# Patient Record
Sex: Female | Born: 1980 | Race: Black or African American | Hispanic: No | Marital: Married | Smoking: Current some day smoker
Health system: Southern US, Community
[De-identification: ages and names within clinical notes are randomized; demographics above are authoritative.]

## PROBLEM LIST (undated history)

## (undated) DIAGNOSIS — Z3009 Encounter for other general counseling and advice on contraception: Secondary | ICD-10-CM

## (undated) DIAGNOSIS — N61 Mastitis without abscess: Principal | ICD-10-CM

## (undated) DIAGNOSIS — O139 Gestational [pregnancy-induced] hypertension without significant proteinuria, unspecified trimester: Secondary | ICD-10-CM

## (undated) DIAGNOSIS — E079 Disorder of thyroid, unspecified: Secondary | ICD-10-CM

## (undated) DIAGNOSIS — G4730 Sleep apnea, unspecified: Secondary | ICD-10-CM

## (undated) DIAGNOSIS — K219 Gastro-esophageal reflux disease without esophagitis: Secondary | ICD-10-CM

## (undated) DIAGNOSIS — Z3490 Encounter for supervision of normal pregnancy, unspecified, unspecified trimester: Principal | ICD-10-CM

## (undated) DIAGNOSIS — B009 Herpesviral infection, unspecified: Secondary | ICD-10-CM

## (undated) DIAGNOSIS — I10 Essential (primary) hypertension: Secondary | ICD-10-CM

## (undated) DIAGNOSIS — F17200 Nicotine dependence, unspecified, uncomplicated: Secondary | ICD-10-CM

## (undated) DIAGNOSIS — R599 Enlarged lymph nodes, unspecified: Secondary | ICD-10-CM

## (undated) DIAGNOSIS — Z30017 Encounter for initial prescription of implantable subdermal contraceptive: Secondary | ICD-10-CM

## (undated) DIAGNOSIS — O99345 Other mental disorders complicating the puerperium: Secondary | ICD-10-CM

## (undated) DIAGNOSIS — R112 Nausea with vomiting, unspecified: Secondary | ICD-10-CM

## (undated) DIAGNOSIS — F53 Postpartum depression: Secondary | ICD-10-CM

## (undated) DIAGNOSIS — E559 Vitamin D deficiency, unspecified: Principal | ICD-10-CM

## (undated) HISTORY — DX: Encounter for supervision of normal pregnancy, unspecified, unspecified trimester: Z34.90

## (undated) HISTORY — DX: Encounter for initial prescription of implantable subdermal contraceptive: Z30.017

## (undated) HISTORY — DX: Disorder of thyroid, unspecified: E07.9

## (undated) HISTORY — DX: Postpartum depression: F53.0

## (undated) HISTORY — DX: Gestational (pregnancy-induced) hypertension without significant proteinuria, unspecified trimester: O13.9

## (undated) HISTORY — DX: Encounter for other general counseling and advice on contraception: Z30.09

## (undated) HISTORY — DX: Other mental disorders complicating the puerperium: O99.345

## (undated) HISTORY — DX: Nausea with vomiting, unspecified: R11.2

## (undated) HISTORY — DX: Enlarged lymph nodes, unspecified: R59.9

## (undated) HISTORY — DX: Vitamin D deficiency, unspecified: E55.9

## (undated) HISTORY — PX: DILATION AND CURETTAGE OF UTERUS: SHX78

## (undated) HISTORY — DX: Mastitis without abscess: N61.0

---

## 2002-08-10 ENCOUNTER — Emergency Department (HOSPITAL_COMMUNITY): Admission: EM | Admit: 2002-08-10 | Discharge: 2002-08-11 | Payer: Self-pay | Admitting: *Deleted

## 2004-08-22 ENCOUNTER — Emergency Department (HOSPITAL_COMMUNITY): Admission: EM | Admit: 2004-08-22 | Discharge: 2004-08-22 | Payer: Self-pay | Admitting: Emergency Medicine

## 2006-11-15 ENCOUNTER — Emergency Department (HOSPITAL_COMMUNITY): Admission: EM | Admit: 2006-11-15 | Discharge: 2006-11-16 | Payer: Self-pay | Admitting: Emergency Medicine

## 2007-02-25 ENCOUNTER — Ambulatory Visit (HOSPITAL_COMMUNITY): Admission: AD | Admit: 2007-02-25 | Discharge: 2007-02-25 | Payer: Self-pay | Admitting: Obstetrics and Gynecology

## 2007-04-25 ENCOUNTER — Ambulatory Visit: Payer: Self-pay | Admitting: *Deleted

## 2007-04-25 ENCOUNTER — Inpatient Hospital Stay (HOSPITAL_COMMUNITY): Admission: AD | Admit: 2007-04-25 | Discharge: 2007-04-25 | Payer: Self-pay | Admitting: Obstetrics and Gynecology

## 2007-05-01 ENCOUNTER — Inpatient Hospital Stay (HOSPITAL_COMMUNITY): Admission: AD | Admit: 2007-05-01 | Discharge: 2007-05-04 | Payer: Self-pay | Admitting: Obstetrics and Gynecology

## 2007-05-06 ENCOUNTER — Inpatient Hospital Stay (HOSPITAL_COMMUNITY): Admission: AD | Admit: 2007-05-06 | Discharge: 2007-05-09 | Payer: Self-pay | Admitting: Obstetrics & Gynecology

## 2007-05-07 ENCOUNTER — Encounter: Payer: Self-pay | Admitting: Obstetrics & Gynecology

## 2007-05-07 ENCOUNTER — Ambulatory Visit: Payer: Self-pay | Admitting: Obstetrics & Gynecology

## 2008-01-20 ENCOUNTER — Emergency Department (HOSPITAL_COMMUNITY): Admission: EM | Admit: 2008-01-20 | Discharge: 2008-01-20 | Payer: Self-pay | Admitting: Emergency Medicine

## 2008-01-21 ENCOUNTER — Encounter (HOSPITAL_COMMUNITY): Admission: RE | Admit: 2008-01-21 | Discharge: 2008-02-20 | Payer: Self-pay | Admitting: Emergency Medicine

## 2008-01-21 ENCOUNTER — Emergency Department (HOSPITAL_COMMUNITY): Admission: EM | Admit: 2008-01-21 | Discharge: 2008-01-21 | Payer: Self-pay | Admitting: Emergency Medicine

## 2008-01-23 ENCOUNTER — Ambulatory Visit: Payer: Self-pay | Admitting: Gastroenterology

## 2008-01-23 ENCOUNTER — Encounter: Payer: Self-pay | Admitting: Gastroenterology

## 2008-01-23 ENCOUNTER — Observation Stay (HOSPITAL_COMMUNITY): Admission: EM | Admit: 2008-01-23 | Discharge: 2008-01-24 | Payer: Self-pay | Admitting: Emergency Medicine

## 2008-01-24 ENCOUNTER — Ambulatory Visit: Payer: Self-pay | Admitting: Gastroenterology

## 2008-01-25 ENCOUNTER — Emergency Department (HOSPITAL_COMMUNITY): Admission: EM | Admit: 2008-01-25 | Discharge: 2008-01-25 | Payer: Self-pay | Admitting: Emergency Medicine

## 2008-02-26 ENCOUNTER — Ambulatory Visit: Payer: Self-pay | Admitting: Gastroenterology

## 2008-02-26 ENCOUNTER — Inpatient Hospital Stay (HOSPITAL_COMMUNITY): Admission: AD | Admit: 2008-02-26 | Discharge: 2008-03-01 | Payer: Self-pay | Admitting: Gastroenterology

## 2008-02-26 ENCOUNTER — Emergency Department (HOSPITAL_COMMUNITY): Admission: EM | Admit: 2008-02-26 | Discharge: 2008-02-26 | Payer: Self-pay | Admitting: Emergency Medicine

## 2008-02-27 ENCOUNTER — Ambulatory Visit: Payer: Self-pay | Admitting: Internal Medicine

## 2008-02-28 ENCOUNTER — Encounter (INDEPENDENT_AMBULATORY_CARE_PROVIDER_SITE_OTHER): Payer: Self-pay | Admitting: General Surgery

## 2008-03-01 ENCOUNTER — Ambulatory Visit: Payer: Self-pay | Admitting: Internal Medicine

## 2008-03-01 ENCOUNTER — Inpatient Hospital Stay (HOSPITAL_COMMUNITY): Admission: EM | Admit: 2008-03-01 | Discharge: 2008-03-04 | Payer: Self-pay | Admitting: Emergency Medicine

## 2008-03-02 ENCOUNTER — Ambulatory Visit: Payer: Self-pay | Admitting: Gastroenterology

## 2008-03-04 ENCOUNTER — Ambulatory Visit: Payer: Self-pay | Admitting: Gastroenterology

## 2008-03-06 ENCOUNTER — Ambulatory Visit: Payer: Self-pay | Admitting: Internal Medicine

## 2008-03-06 ENCOUNTER — Inpatient Hospital Stay (HOSPITAL_COMMUNITY): Admission: EM | Admit: 2008-03-06 | Discharge: 2008-03-10 | Payer: Self-pay | Admitting: Emergency Medicine

## 2008-03-09 ENCOUNTER — Ambulatory Visit: Payer: Self-pay | Admitting: Gastroenterology

## 2008-03-10 ENCOUNTER — Ambulatory Visit: Payer: Self-pay | Admitting: Gastroenterology

## 2008-03-12 ENCOUNTER — Emergency Department (HOSPITAL_COMMUNITY): Admission: EM | Admit: 2008-03-12 | Discharge: 2008-03-12 | Payer: Self-pay | Admitting: Emergency Medicine

## 2009-06-29 ENCOUNTER — Other Ambulatory Visit: Admission: RE | Admit: 2009-06-29 | Discharge: 2009-06-29 | Payer: Self-pay | Admitting: Obstetrics & Gynecology

## 2010-01-15 IMAGING — US US ABDOMEN COMPLETE
1 series · 14 of 25 positions shown · non-contrast
Comparison: None.

CLINICAL DATA: Abdominal pain, nausea, vomiting

ABDOMEN ULTRASOUND
TECHNIQUE: Complete abdominal ultrasound examination was performed
including evaluation of the liver, gallbladder, bile ducts,
pancreas, kidneys, spleen, IVC, and abdominal aorta.

[Series 1: unknown · 0.35mm/px · 14 of 52 slices shown]
[im 1/52]
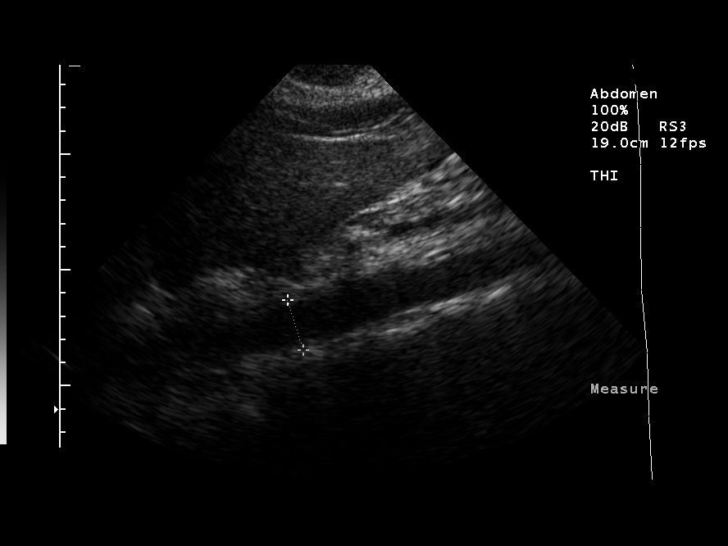
[im 5/52]
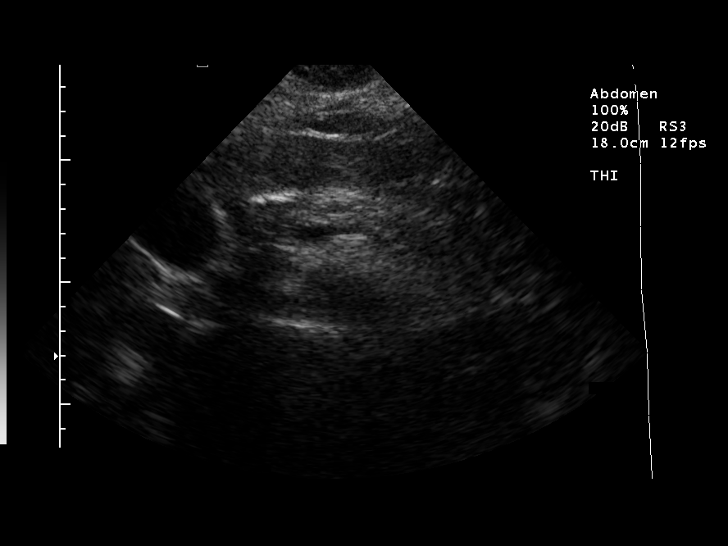
[im 9/52]
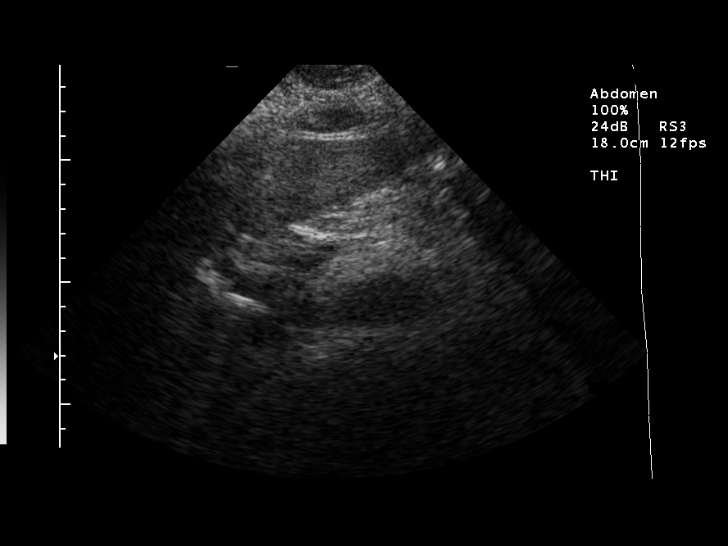
[im 13/52]
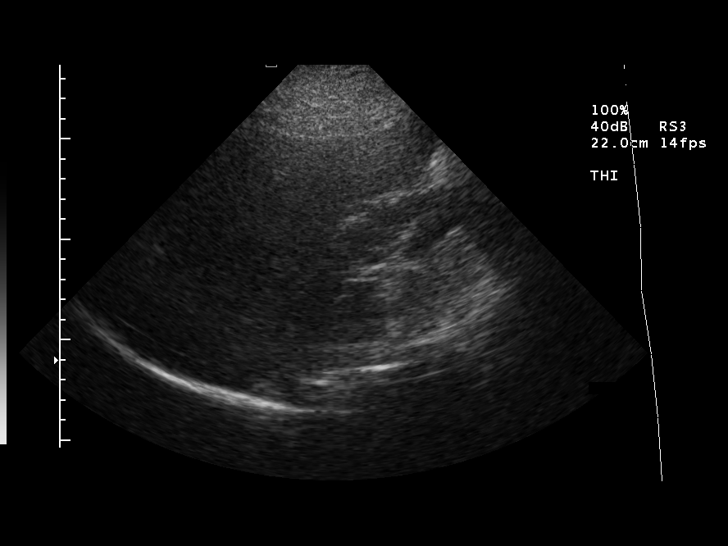
[im 18/52]
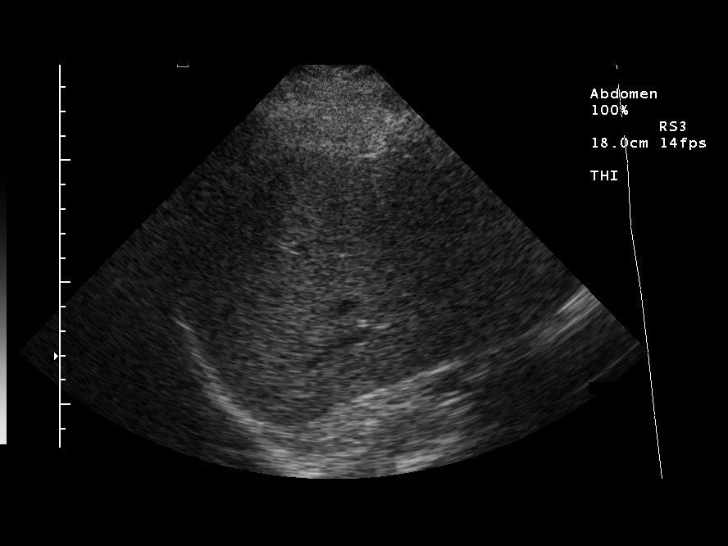
[im 20/52]
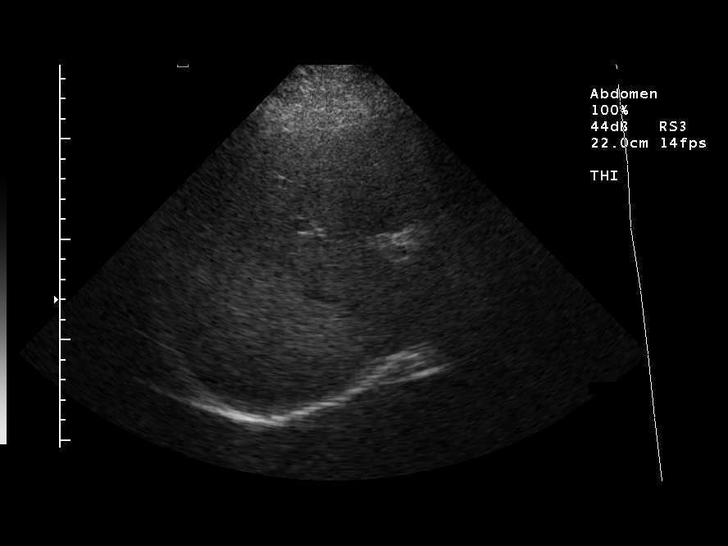
[im 24/52]
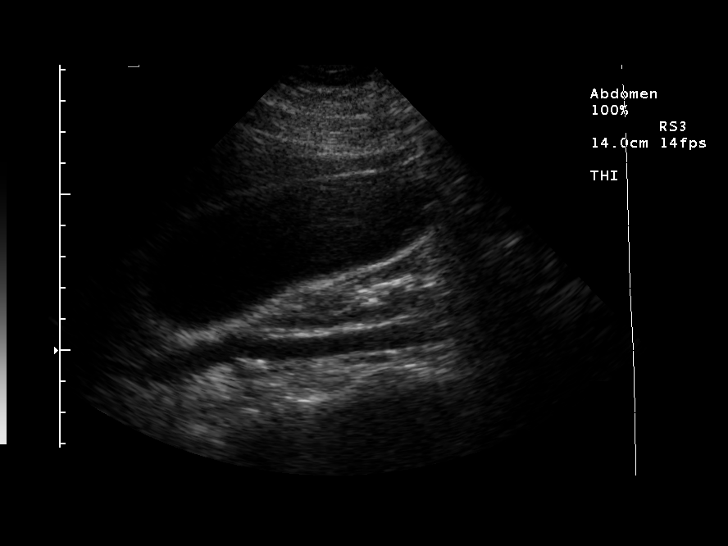
[im 28/52]
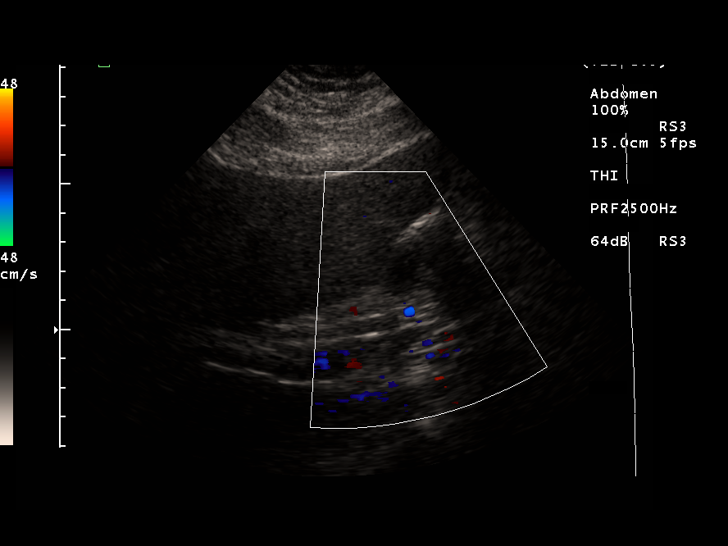
[im 32/52]
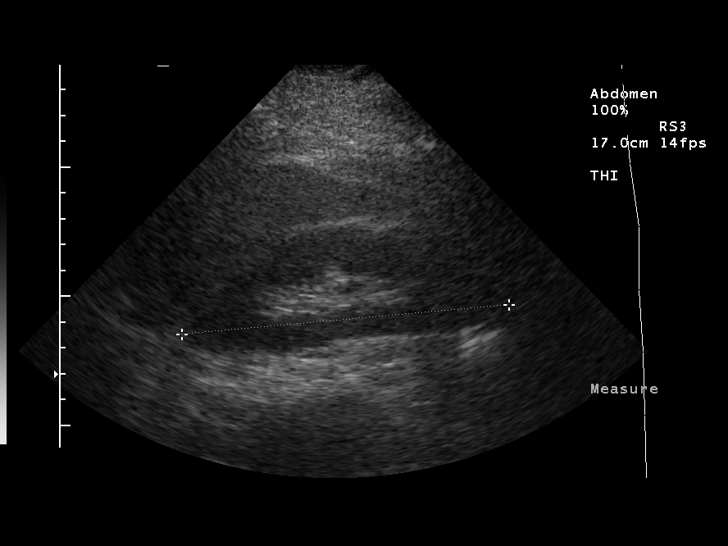
[im 35/52]
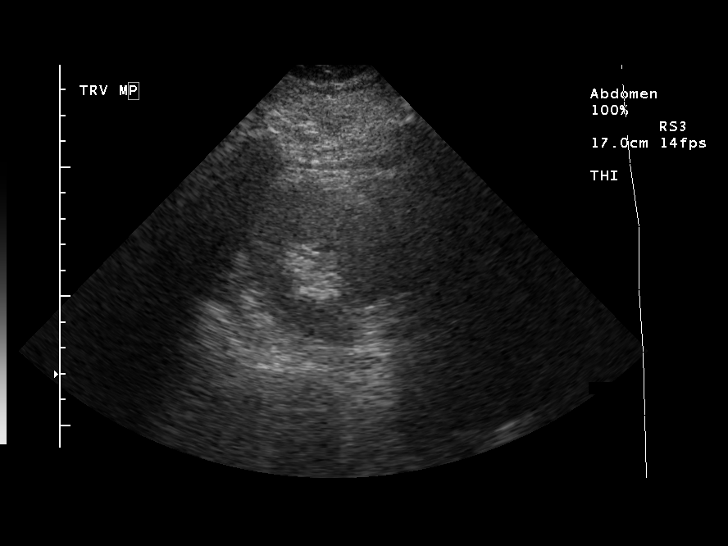
[im 39/52]
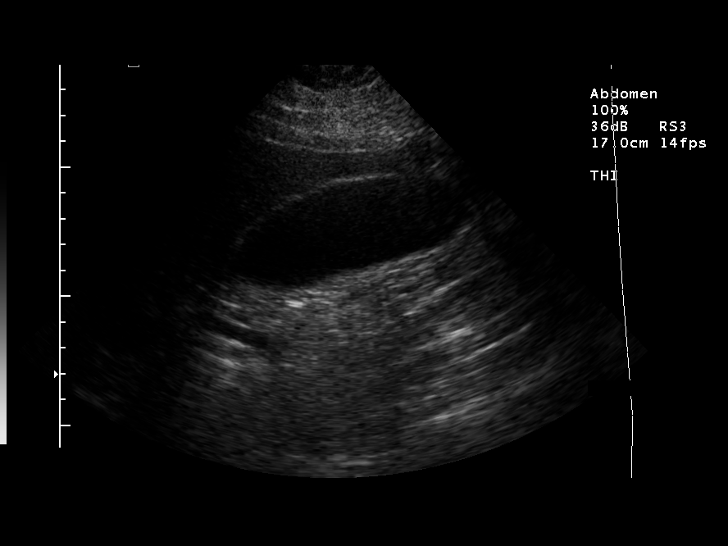
[im 43/52]
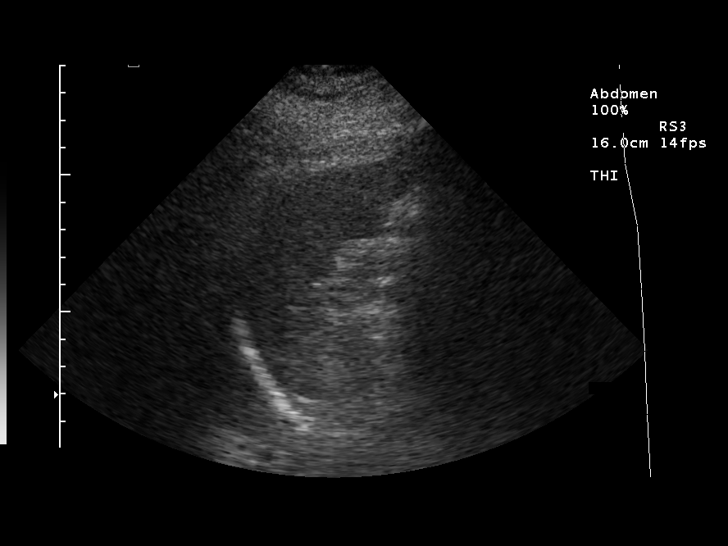
[im 47/52]
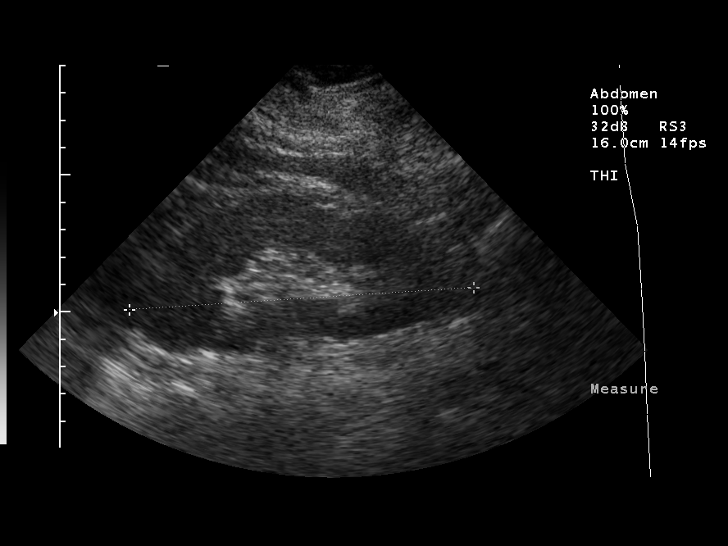
[im 52/52]
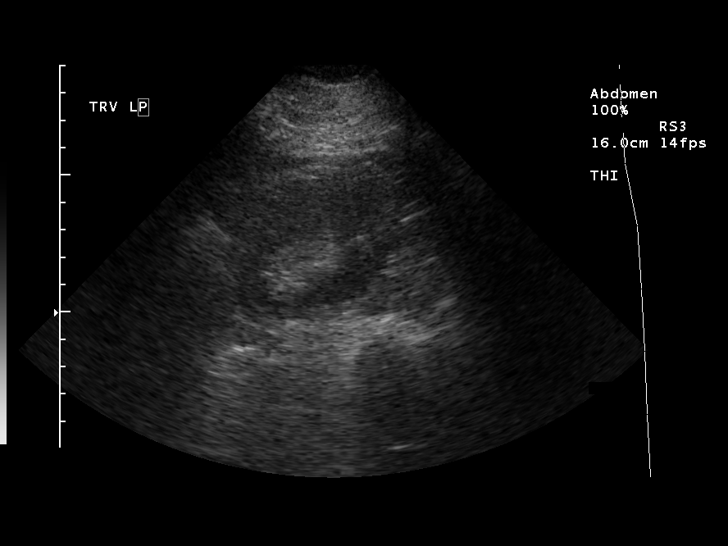

[14 of 25 positions shown; findings below may reference images not displayed]

FINDINGS: The gallbladder is normal without stone disease, wall
thickening or pericholecystic fluid.  Gallbladder wall thickness is
2.6 mm.  The common bile duct is normal with a diameter of 4.5 mm.
The liver demonstrates coarse increased echogenicity which can be
seen with mild fatty infiltration.  The imaging of the IVC,
pancreas, spleen, kidneys, and aorta are within normal limits.  The
spleen has a length of 9.3 cm.  The right kidney measures 12.7 cm
and the left kidney measures 12.6 cm.  No hydronephrosis or
obstruction.  No perinephric fluid collection.  Aorta has a maximal
diameter 2.3 cm.  No aneurysm or free fluid.

Exam is limited because of patient body habitus.
IMPRESSION: No acute finding by abdominal ultrasound.

## 2010-01-19 IMAGING — CR DG ABDOMEN ACUTE W/ 1V CHEST
4 series · 4 of 4 positions shown · non-contrast
Comparison: 05/07/2007

CLINICAL DATA: Nausea, vomiting, back, abdominal pain.  Status post
EGD with biopsy on 01/23/2008

ACUTE ABDOMEN SERIES (ABDOMEN 2 VIEW & CHEST 1 VIEW)

[view not recorded (1 of 4)]
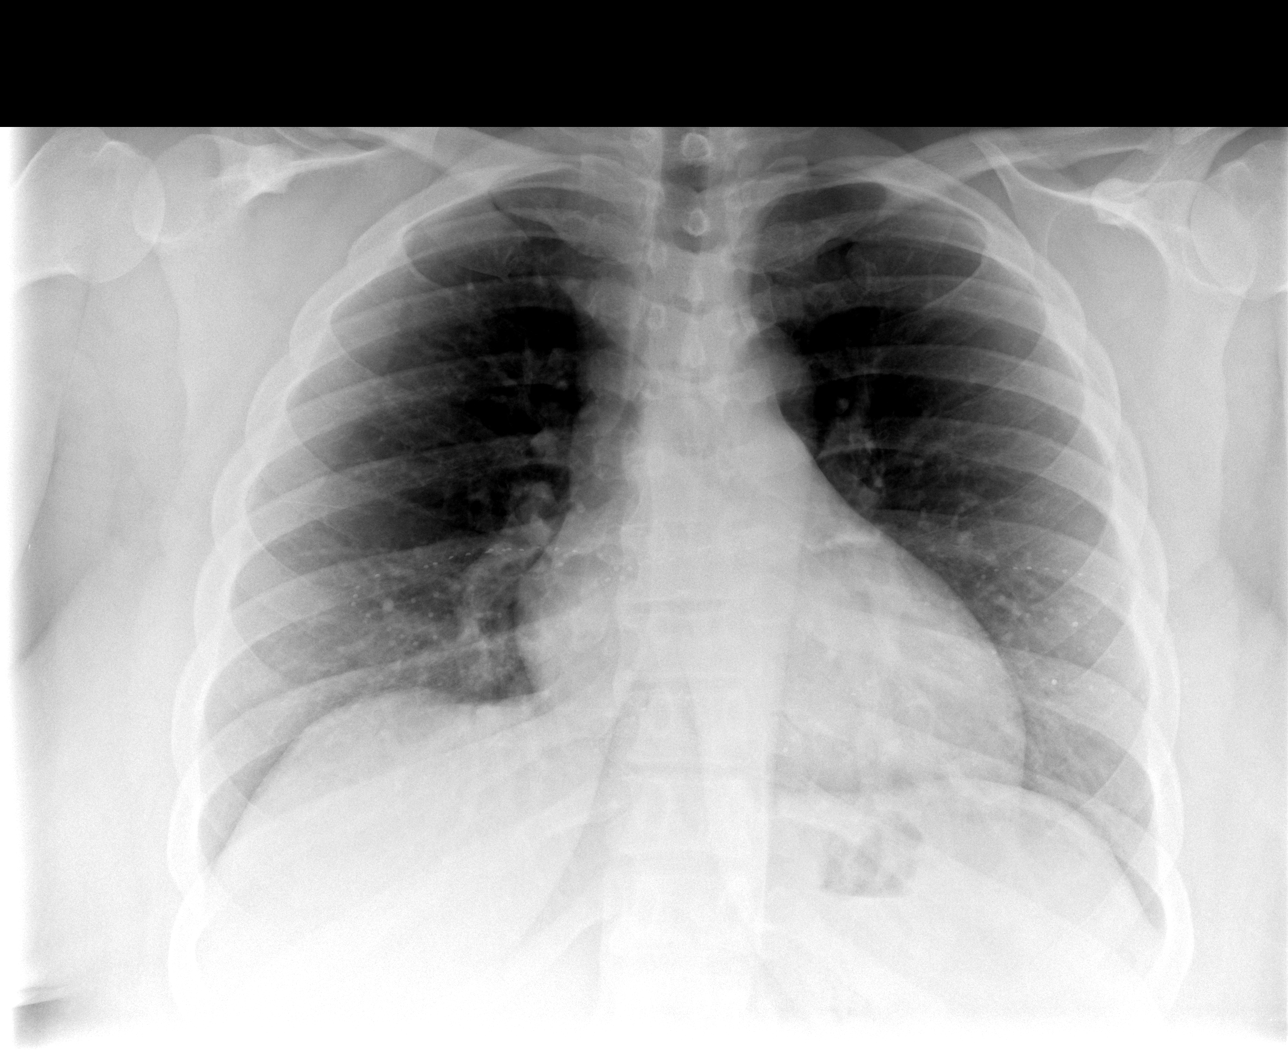

[view not recorded (2 of 4)]
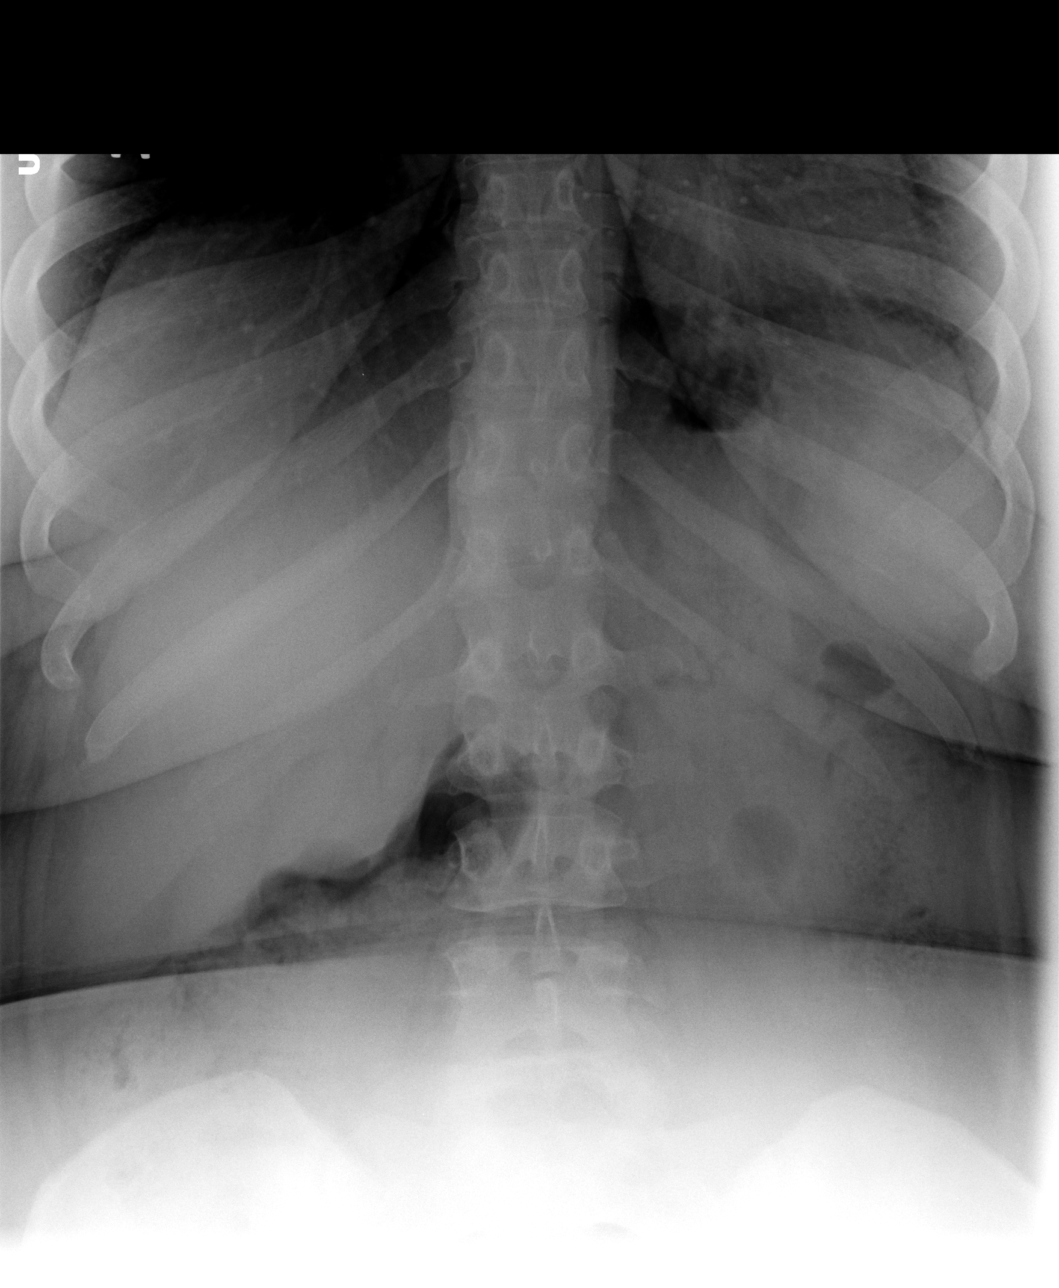

[view not recorded (3 of 4)]
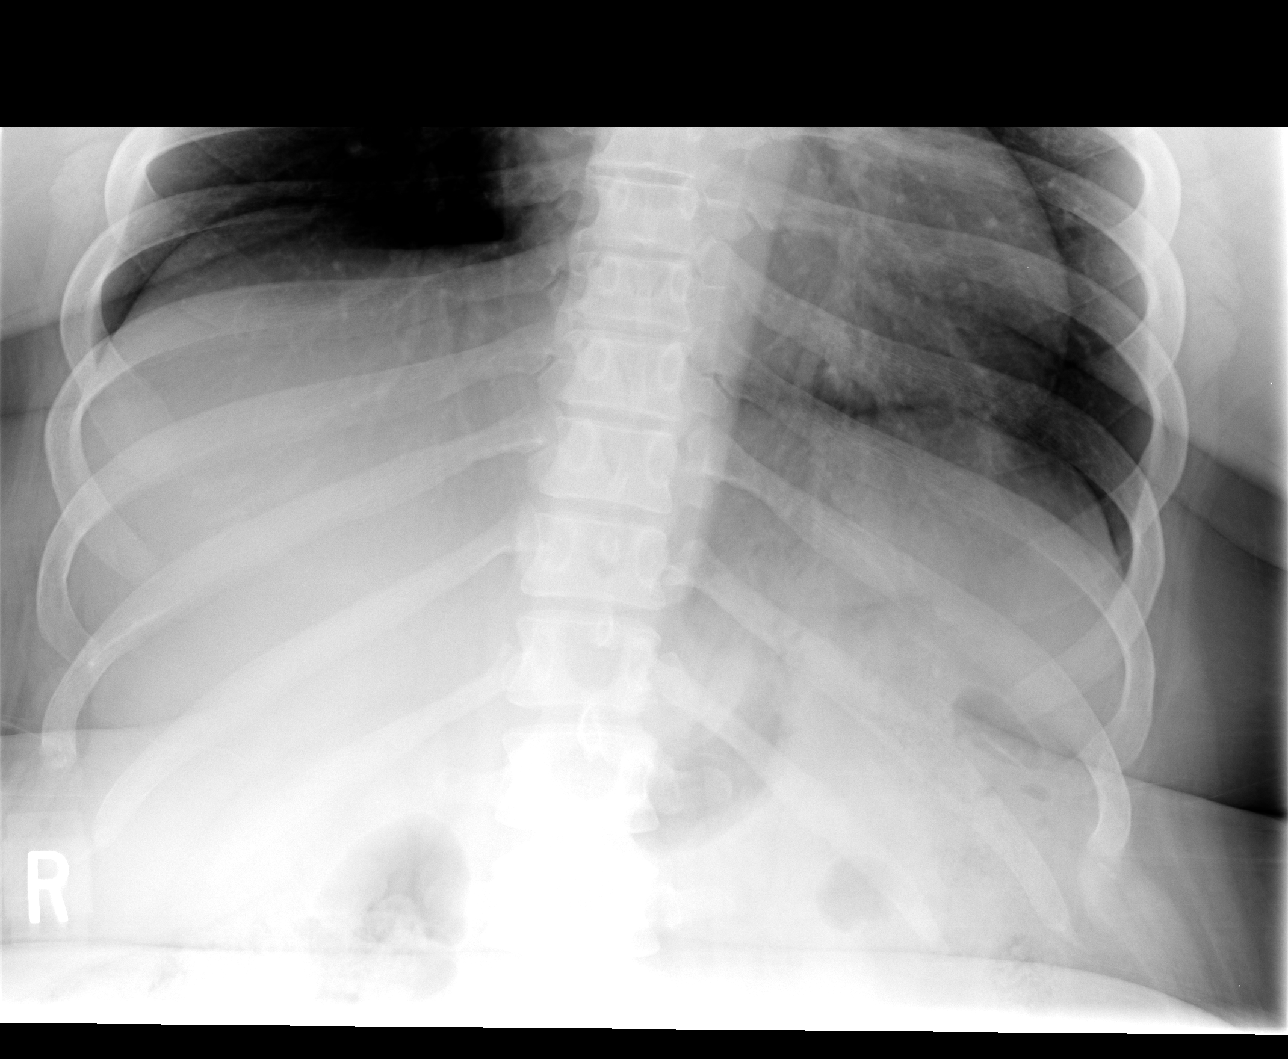

[view not recorded (4 of 4)]
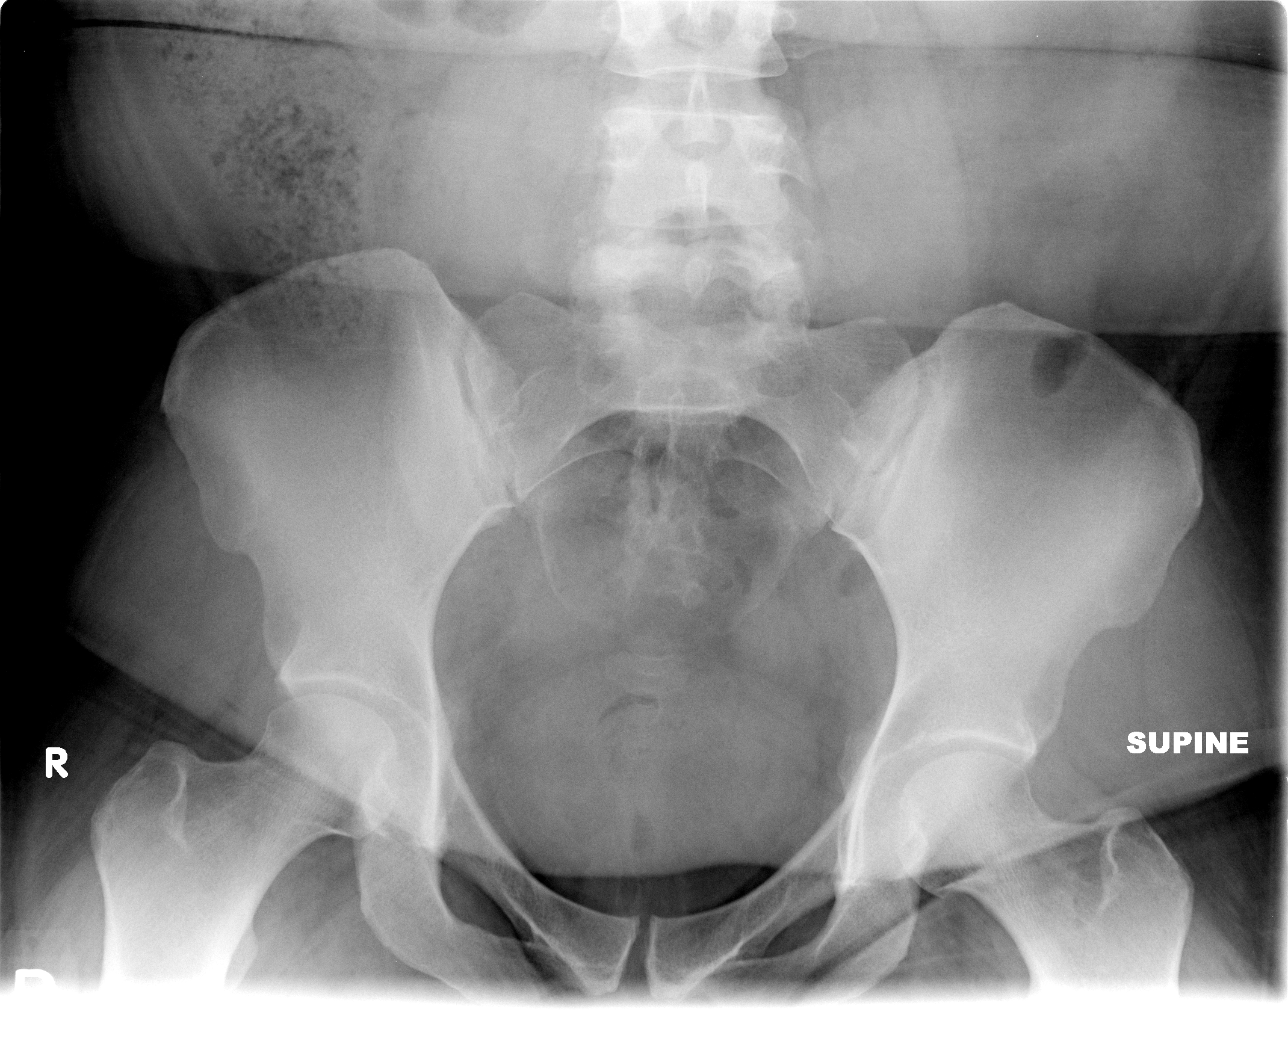

[4 of 4 positions shown; findings below may reference images not displayed]

FINDINGS: Heart size is upper limits normal.  Lungs are free of
focal consolidations and pleural effusions. Radiopaque material
overlies the mid chest, presumably related to the patient's
clothing.

Supine and erect views of the abdomen show a nonobstructive bowel
gas pattern.  There is no free intraperitoneal air beneath the
diaphragm.  Overall, there is a paucity of bowel gas.
IMPRESSION: No evidence for acute cardiopulmonary disease or bowel obstruction.

## 2010-07-28 ENCOUNTER — Other Ambulatory Visit: Admission: RE | Admit: 2010-07-28 | Discharge: 2010-07-28 | Payer: Self-pay | Admitting: Obstetrics & Gynecology

## 2010-09-11 NOTE — L&D Delivery Note (Signed)
Delivery Note At 7:46 AM a viable female was delivered via  (Presentation: OP).  APGAR: 7 @ 5 min, 9 @ 10 minutes; weight 8 lb 6.9 oz (3824 g).   Placenta status: delivered spontaneously , intact .  Cord: 3 vessels with the following complications: Nuchal cord x 1. Cord pH: 7.24  Complications: There was fetal bradycardia for approximately 2 minutes prior to delivery, which was prolonged by an OP presentation. The baby had poor tone, respiratory effort and color upon delivery, so she was immediately transferred to the warmer. A Code Apgar was called. Dr. Katrinka Blazing from the NICU responded and cared for the baby accordingly. The patient has a post-partum hemorrhage immediately after delivery of the placenta. This was treated with intrauterine pressure and fundal massage. No placenta was retained after a sweep of the uterus. 1000 mcg of Cytotec was placed per rectum. The hemorrhage slowed significantly after the delivery of Cytotec. Of note, the patient had been on magnesium for approximately 6 hours for pre-eclampsia.   Anesthesia: Epidural Lacerations: 2nd degree, closed  Suture Repair: 3.0 vicryl by Dr.  Karilyn Cota. Blood Loss (mL): 1200 mL   Mom to AICU for magnesium treatment for pre-eclampsia.  Baby to nursery-stable.  Mat Carne M.D.  07/08/2011, 9:11 AM

## 2011-01-24 NOTE — H&P (Signed)
[name], [name]              ACCOUNT NO.:  [id number]   MEDICAL RECORD NO.:  [id number]          PATIENT TYPE:  INP   LOCATION:  A315                          FACILITY:  [facility]   PHYSICIAN:  Kassie Mends, M.D.      DATE OF BIRTH:  08/11/1981   DATE OF ADMISSION:  02/26/2008  DATE OF DISCHARGE:  LH                              HISTORY & PHYSICAL   CHIEF COMPLAINT:  Acute abdominal pain, nausea, and vomiting.   HISTORY OF PRESENT ILLNESS:  [name] is a 30 year old obese African  American female.  [name] was initially evaluated by Dr. Kassie Mends on Jan 23, 2008, when [name] was hospitalized at [facility].  [name] began  to have right upper quadrant abdominal pain which was quite severe along  with nausea and vomiting which required hospitalization.  [name] had an  ultrasound and HIDA scan which showed high-grade nonvisualization of the  gallbladder in 2 hours.  [name] was unable to have morphine injection as  [name] did not have a right home.  [name] had an EGD which showed a 6 mm x 3  mm ulcer in the gastric body.  [name] has been on b.i.d. Protonix since  that time.  [name] was doing very well until work last night.  [name] tells me  [name] developed severe abdominal pain about 1 a.m. along with nausea and  vomiting multiple times.  [name] describes the pain as constant to [name]  right upper quadrant epigastrium and it does radiate from [name] mid back  forward and [name] right flank.  [name] rates the pain 8/10 on pain scale.  [name] last ate at [facility] about 11:30 last night.  [name] denies any  fever.  [name] has gone 2-3 days without a bowel movement.  [name] denies any  clay-colored stools, rectal bleeding, or melena.  [name] denies any  anorexia or early satiety.  [name] has not had a chance to get [name] pain  pills filled and felt like [name] did not need them until this acute  episode of pain.  [name] was given a prescription for hydrocodone from Dr.  Cira Servant the last time [name] was in the hospital.  [name] did present to the ER  early  this morning.  Laboratory studies showed a white blood cell count  of 11.2, a hemoglobin 11.6, a hematocrit of 35.3, and a platelet count  of 301.  [name] had a lipase of 25.  [name] had a complete metabolic panel  which showed a glucose of 125 and normal LFTs.   PAST MEDICAL AND SURGICAL HISTORY:  1. Peptic ulcer disease found on EGD, Jan 23, 2008, has been on b.i.d.      Protonix.  2. History of ectopic pregnancy, D&C.  3. [name] did receive a blood transfusion in February 2009.  [name] has      history of chronic anemia.   MEDICATIONS PRIOR TO ADMISSION:  Protonix 40 mg b.i.d.   ALLERGIES:  No known drug allergies.   FAMILY HISTORY:  There is no known family history of colorectal  carcinoma, liver, or chronic GI problems.  SOCIAL HISTORY:  [name] is married.  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  [name] denies any tobacco, alcohol, or drug use.   REVIEW OF SYSTEMS:  See HPI.  GU:  [name] denies any dysuria, hematuria, or  increased urinary frequency.  Otherwise, negative review of systems.   PHYSICAL EXAMINATION:  VITAL SIGNS:  Weight 296 pounds, height 65  inches, temperature 98.9, blood pressure 128/86, and pulse 76.  GENERAL:  [name] is a morbidly obese African American female who is  alert, oriented, pleasant, cooperative, and diaphoretic.  [name] complains  of severe pain throughout the exam.  HEENT:  Sclerae clear, nonicteric. Conjunctivae pink. Oropharynx pink  and moist without any lesions.  NECK:  Supple without mass or thyromegaly.  CHEST:  Heart regular rate and rhythm, normal S1 and S2 without murmurs,  clicks, rubs, or gallops.  LUNGS:  Clear to auscultation bilaterally.  ABDOMEN:  Positive bowel sounds x4.  No bruits auscultated.  Soft.  [name]  does have moderate tenderness to the epigastrium, right upper quadrant,  and umbilicus on deep palpation.  There is no rebound tenderness or  guarding.  [name] does have positive Murphy sign.  There is no  hepatosplenomegaly or mass, although  exam is limited given the patient's  body habitus.  EXTREMITIES:  Without clubbing or edema bilaterally.   IMPRESSION:  [name] is a 30 year old African American female with  history of peptic ulcer disease, chronic anemia, acute onset of nausea  and vomiting, and right upper quadrant epigastric abdominal pain which  radiates to [name] back.  Symptoms are suspicious for biliary etiology and  gallbladder was not visualized after 2 hours on previous tests.  [name]  could have biliary dyskinesia as the culprit of [name] pain.  [name] LFTs are  normal, however.  Other possibilities include acute gastroenteritis,  renal lithiasis, ectopic pregnancy, appendicitis, and less likely  diverticulitis.  I have discussed this case further with Dr. Cira Servant.  We  also need to rule out acute intermittent porphyria.   PLAN:  1. We will admit to [facility] under Dr. Cira Servant' service.  2. MRCP today.  3. Stat UA and urine pregnancy.  4. A 24-hour urine for PBGs and ALA.  5. Urine drug screen.  6. IV fluids normal saline 100 mL/h n.p.o.  7. Zofran 4 mg q.6 hours for nausea and vomiting.  8. Dilaudid 1 mg q.4 h. p.r.n. severe abdominal pain.  9. CBC, LFTs, MET-7 in the a.m.  10.Protonix 40 mg IV b.i.d.  11.Pending MRCP findings, we would consider CT scanning of entire      abdomen and pelvis to rule out above if symptoms persist and [name]      does not turn around.      Lorenza Burton, N.P.      Kassie Mends, M.D.  Electronically Signed    KJ/MEDQ  D:  02/26/2008  T:  02/27/2008  Job:  [id number]

## 2011-01-24 NOTE — Discharge Summary (Signed)
NAME:  JIMMA, ORTMAN              ACCOUNT NO.:  [id number]   MEDICAL RECORD NO.:  [id number]          PATIENT TYPE:  INP   LOCATION:  A315                          FACILITY:  [facility]   PHYSICIAN:  R. Roetta Sessions, M.D. DATE OF BIRTH:  07-26-1981   DATE OF ADMISSION:  02/26/2008  DATE OF DISCHARGE:  06/21/2009LH                               DISCHARGE SUMMARY   PRIMARY GASTROENTEROLOGIST:  Dr. Anselmo Rod.   PRIMARY CARE PHYSICIAN:  The patient does not have a primary care  physician.   DISCHARGE DIAGNOSES:  1. Acute cholecystitis with gallbladder, hydrops status post      laparoscopic cholecystectomy by Dr. Leticia Penna.  2. Urinary tract infection.  3. Peptic ulcer disease.   SERVICE:  Dr. Kassie Mends.   SURGEON:  Dr. Tilford Pillar.   PROCEDURES:  On February 28, 2008, she underwent a laparoscopic  cholecystectomy by Dr. Leticia Penna for gallbladder, hydrops, cholelithiasis  and acute cholecystitis.  For history, see complete history and physical  from February 26, 2008.   HOSPITAL COURSE:  Basically, ms. Cleckler was admitted on February 26, 2008,  with acute intermittent epigastric and right upper quadrant abdominal  pain, nausea and vomiting.  She previously had an ultrasound and HIDA  scan which showed high-grade nonvisualization of the gallbladder in 2  hours.  She was unable to obtain morphine injection due to the fact that  she did not have a ride home.  She would be admitted to [facility], underwent MRCP.  She was found to have gallstones identified  within the dependent portion of the gallbladder with a gallbladder wall  thickness 6 mm, possible stone in the cystic duct.  Surgical  consultation was obtained by Dr. Leticia Penna and he did proceed with  laparoscopic cholecystectomy as above.  On postop day #1, she was  tachycardic and had a temperature of 101.6.  Urinalysis was obtained  which did show UTI.  She was started on p.o. Cipro at that time.  She is  doing well this morning,  is eating a regular diet, afebrile and without  complaints of pain or nausea this morning.  She has been taking the p.o.  Percocet as needed for pain.   PHYSICAL EXAMINATION:  VITAL SIGNS:  Today, temperature 98.3, pulse 93,  respirations 20, blood pressure 126/80, O2 93% on room air.  She is  alert, oriented, pleasant and cooperative.  No acute distress.  HEENT:  Sclerae are clear, nonicteric.  Conjunctivae pink.  Oropharynx  pink, moist without lesions.  NECK:  Supple without thyromegaly.  CHEST:  Regular rate and rhythm.  Normal S1-S2 no murmurs, clicks, rubs.  Abdomen: Obese with well healing surgical scars with Steri-Strips  intact.  No exudates.  Abdomen has positive bowel sounds x4.  No bruits  auscultated.  Soft, nontender without palpable mass or  hepatosplenomegaly.  EXTREMITIES:  Without clubbing, edema and negative Homans' sign.  SKIN:  Warm and dry without rash or jaundice.   LABORATORY STUDIES:  From today, white blood cell count 13.3, hemoglobin  2.6, hematocrit 32.5 and platelets 279.   DISCHARGE CONDITION:  Good.   DISPOSITION:  She is discharged to home.   MEDICATIONS:  1. Protonix 40 mg p.o. b.i.d.  2. Cipro 500 mg p.o. b.i.d. for 3 days more.  3. Multivitamin once daily.  4. Percocet 5/325 mg p.o. every 4 hours as needed for pain.  5. Colace 100 mg stool softener b.i.d. p.r.n. for constipation.   INSTRUCTIONS:  She is to advance activity as tolerated.  Resume a  regular low fat, low cholesterol diet and follow up with Dr. Leticia Penna as  planned.  She is going to call the office on Monday and follow up with  Dr. Kassie Mends within the next 2 months and call sooner if she has any  problems.      Lorenza Burton, N.P.      Jonathon Bellows, M.D.  Electronically Signed    KJ/MEDQ  D:  03/01/2008  T:  03/01/2008  Job:  [id number]   cc:   Tilford Pillar, MD  Fax: [phone]

## 2011-01-24 NOTE — H&P (Signed)
NAMEDACODA, SPALLONE              ACCOUNT NO.:  [id number]   MEDICAL RECORD NO.:  [id number]          PATIENT TYPE:  INP   LOCATION:  A312                          FACILITY:  [facility]   PHYSICIAN:  R. Roetta Sessions, M.D. DATE OF BIRTH:  04-18-1981   DATE OF ADMISSION:  03/01/2008  DATE OF DISCHARGE:  LH                              HISTORY & PHYSICAL   PRIMARY GASTROENTEROLOGIST:  Kassie Mends, M.D.   CHIEF COMPLAINT:  Nausea, vomiting, fever, chills, status post  cholecystectomy.   HISTORY OF PRESENT ILLNESS:  Ms. Macaluso is a 30 year old African-American  female.  She was discharged home from the hospital today status post  laparoscopic cholecystectomy by Dr. Tilford Pillar with history of UTI  and peptic ulcer disease.  She was discharged home in stable condition.  She was afebrile without nausea, vomiting or abdominal pain.  She went  home and within a couple of hours she developed diaphoresis, nausea,  vomiting and a throbbing right upper quadrant and mid abdominal pain at  her incision sites.  The pain is made worse with movement.  She states  the pain is similar to what she had prior to the cholecystectomy but not  quite as severe.  She rates the pain as a 7/10 on the scale.  She has  not had a bowel movement yet since surgery.  Her temperature at home was  around 100.9.  She notices decreased urinary frequency.  Denies any  dysuria at this point.  She last ate salad and Jello prior to discharge  around 1300 today.  She has not taken any medications since she has been  home.  Please see complete dictated discharge summary for complete  hospitalization details.  She did have a mild bump in her AST and ALT of  45 and 70 respectively.  Her albumin is 2.8.  She had a white blood cell  count of 14.7.  Her hemoglobin is stable at 11.1.   PAST MEDICAL AND SURGICAL HISTORY:  1. Peptic ulcer disease.  2. Cholelithiasis.  3. Gallbladder hydrops status post laparoscopic cholecystectomy by  Dr.      Leticia Penna.  4. UTI.   MEDICATIONS PRIOR TO ADMISSION:  1. Protonix 40 mg b.i.d.  2. Cipro 500 mg b.i.d.  3. Multivitamin daily.  4. Percocet 5/325 mg q.4h. p.r.n.  5. Colace 100 mg b.i.d. p.r.n. constipation.   ALLERGIES:  NO KNOWN DRUG ALLERGIES.   FAMILY HISTORY AND SOCIAL HISTORY:  Noncontributory.   REVIEW OF SYSTEMS:  She denies any headache, dizziness.  CARDIOPULMONARY:  She denies any chest pain, cough or hemoptysis.  GI:  See HPI.   PHYSICAL EXAMINATION:  VITAL SIGNS:  Temperature 100.5 pulse 106,  respirations 22, blood pressure 147/93.  GENERAL:  Ms. Duarte is a 30 year old obese African-American female who  is alert, oriented, pleasant, cooperative.  She is diaphoretic.  HEENT:  Sclerae are clear, nonicteric.  Conjunctiva are pink.  Oropharynx is pink and moist.  NECK:  Supple without mass or thyromegaly.  CHEST:  Heart rate with mild tachycardia.  LUNGS:  Clear to auscultation bilaterally.  ABDOMEN:  She has well-healing cholecystectomy scars with Steri-Strips  intact and no exudate.  She has positive bowel sounds x4.  No bruits  auscultated.  Abdomen is slightly distended.  She does have moderate  right upper quadrant and mid abdominal tenderness around the umbilicus.  There is no rebound tenderness or guarding.  Abdomen is not rigid.  EXTREMITIES:  Without clubbing, edema or Homans' sign.   LABORATORY STUDIES:  Hemoglobin 11.1, hematocrit 34.1, platelets 323.  White blood cell count of 14.7.  Sodium 137, potassium 3.9, chloride  101, CO2 of 33, BUN 5, creatinine 0.87, glucose 117 and calcium 8.9.  Total bilirubin 0.9, direct bilirubin 0.3, indirect 0.6 and alkaline  phosphatase 117.   IMPRESSION:  Ms. Roseman is a 30 year old African-American female 2 days  postop laparoscopic cholecystectomy for gallbladder hydrops,  cholelithiasis, acute cholecystitis.  She was discharged home from the  hospital today in stable condition.  She returned with nausea,  vomiting,  fever and right upper quadrant mid abdominal pain.  She has had a mild  bump in her transaminases.  Would be concerned about bile leak and  perforation, and she also has a urinary tract infection and has been on  Cipro for 24 hours now.  She does have a history of peptic ulcer  disease, as well.   I have discussed this case with Dr. Jena Gauss, as well as Dr. Tilford Pillar.   PLAN:  1. She will be admitted to [facility].  2. IV fluids, pain control, antiemetics.  3. Chest x-ray now.  4. Stat CT of the abdomen and pelvis with IV and oral contrast.  5. CBC, LFTs, MET7 in the morning.  6. I would suspect air-fluid levels on CT scan due to the fact that      she is postop; however, if bile leak is not identified, would      pursue a HIDA scan in the morning to rule out bile leak  7. Unasyn 1.5 grams q.6h.  8. Ms. Rizor will be placed on Dr. Illene Regulus consult list, as he would      like to see her in the morning.     Lorenza Burton, N.P.      Jonathon Bellows, M.D.  Electronically Signed   KJ/MEDQ  D:  03/01/2008  T:  03/01/2008  Job:  [id number]

## 2011-01-24 NOTE — H&P (Signed)
NAMEJAZZMON, PRINDLE              ACCOUNT NO.:  [id number]   MEDICAL RECORD NO.:  [id number]          PATIENT TYPE:  OBV   LOCATION:  A336                          FACILITY:  [facility]   PHYSICIAN:  Skeet Latch, DO    DATE OF BIRTH:  [birth year]   DATE OF ADMISSION:  01/23/2008  DATE OF DISCHARGE:  LH                              HISTORY & PHYSICAL   PRIMARY CARE PHYSICIAN:  [facility].   CHIEF COMPLAINTS:  Abdominal pain with vomiting.   HISTORY OF PRESENT ILLNESS:  This is a 30 year old African American  female who presents with an approximately 10-day history of pain that  starts her right scapula and radiates to her right upper quadrant.  The  patient states that she has also been having some vomiting.  She states  the pain lasts 10-15 minutes and can last for 3 hours.  The patient  states that it can be precipitated by food, especially late at night.  The patient has been using some ibuprofen that she has been given in the  emergency room.  The patient states that she does not drink lots of  alcohol, but does on occasion at social events.  The patient denies any  heartburn, chest pain, or genitourinary complaints.  The patient denies  any __________.   GASTROINTESTINAL:  Positive for some abdominal pain and nausea and  vomiting.  CARDIOVASCULAR:  No chest pain or palpitations.  GENITOURINARY:  No dysuria, urgency, or frequency.  RESPIRATORY:  No  cough, shortness of breath, or wheezing.  Other systems are negative.   PHYSICAL EXAMINATION:  VITAL SIGNS:  Temperature 98.4, pulse 79,  respirations 20, blood pressure 107/87, she is saturating at 97%.  CARDIOVASCULAR:  Regular rate and rhythm.  No murmurs, rubs, or gallops.  LUNGS:  Clear to auscultation bilaterally.  No rales, rhonchi, or  wheezing.  ABDOMEN:  Obese, soft.  Minimal tenderness right upper quadrant on deep  palpation.  No rigidity or guarding.  She has positive bowel sounds.  EXTREMITIES:   No clubbing, cyanosis, or edema.  NEUROLOGIC:  Cranial nerves II-XII are grossly intact.  The patient is  alert and oriented x3.   LABORATORIES:  Hemoglobin 12.1, hematocrit 36.4, white count 9.5,  platelets 320.  Sodium 139, potassium 3.6, chloride 104, CO2 26, BUN 9,  creatinine 0.8, glucose 104, AST 19, ALT 20, alkaline phosphatase 99,  total bilirubin 0.3, lipase 23, albumin 3.6, calcium 9.  Urinalysis  showed positive protein, no leukocytes or nitrites.  Urine pregnancy  test done on Jan 20, 2008 was negative.   She did have a HIDA scan.  Gallbladder was not visualized over 2 hours.  Also, on Jan 21, 2008, she had an ultrasound that showed no acute  findings by abdominal ultrasound.   ASSESSMENT AND PLAN:  1. Abdominal pain with nausea and vomiting.  The patient will be      admitted to the service of [facility].  2. Gastroenterology has been consulted and has already seen the      patient, and the patient did have an EGD which I believe  found some      ulcers.  The patient has been placed on a clear liquid diet.  She      is ordered pain medication.  A gastrin level has been ordered, and      she will not receive any aspirin or NSAIDs for next 30 days.  The      patient also was place on a PPI q.12 h.  3. The patient be IV hydrated, and she will be watched fairly closely      at this time.  I anticipate the patient being discharged fairly      soon if she continues to improve.      Skeet Latch, DO  Electronically Signed     SM/MEDQ  D:  01/23/2008  T:  01/23/2008  Job:  [id number]

## 2011-01-24 NOTE — Consult Note (Signed)
[name], [name]              ACCOUNT NO.:  [id number]   MEDICAL RECORD NO.:  [id number]          PATIENT TYPE:  INP   LOCATION:  A315                          FACILITY:  [facility]   PHYSICIAN:  Tilford Pillar, MD      DATE OF BIRTH:  June 17, 1981   DATE OF CONSULTATION:  02/27/2008  DATE OF DISCHARGE:                                 CONSULTATION   REASON FOR CONSULTATION:  Abdominal pain.   HISTORY OF PRESENT ILLNESS:  The patient is a 30 year old African  American female with a history of peptic ulcer disease and chronic  anemia and morbid obesity who presents with abdominal pain, nausea and  vomiting.  This had happened initially a month ago at which time she was  evaluated,  had an endoscopy done which demonstrated a peptic ulcer  disease and gastritis.  She was treated on Protonix and initially had  resolution of symptoms.  However, since that time, she has developed a  new onset of right upper quadrant abdominal pain.  This pain starts in  the mid back and radiates around the right side to the epigastrium.  She  describes it as sharp and constant and 8/10 on the pain scale.  She has  had diarrhea which has been nonbloody.  No hematochezia and no melena.  She has had nonbloody emesis and nauseas typically associated after the  pain and she has had no other change with bowel movements.  She has had  no family history of gallbladder disease.  She has had two pregnancies  in the past.  She has no Native American ancestry.  She has had no fever  or chills.   PAST MEDICAL HISTORY:  1. Peptic ulcer disease.  2. Ectopic pregnancy.  3. Chronic anemia.   PAST SURGICAL HISTORY:  D&C.   MEDICATIONS:  1. Protonix.  2. Ultram.  3. Zofran.   ALLERGIES:  No known drug allergies.   SOCIAL HISTORY:  No tobacco.  No alcohol.  No recreational drug use.   PHYSICAL EXAM:  VITAL SIGNS:  97.8, 78, 20, 134/87, 100% oxygen  saturation on room air.  GENERAL:  She is morbidly obese.  She is  lying in a supine position in  the hospital bed.  She does appear somewhat uncomfortable.  She does not  appear to be in any acute distress.  She is alert and oriented x3.  HEENT:  Pupils are equal, round and reactive.  Extraocular movements are  intact.  No scleral icterus is noted.  NECK:  Trachea is midline.  PULMONARY:  Unlabored respirations.  She is clear to auscultation  bilaterally.  CARDIOVASCULAR:  Regular rate and rhythm.  She has 2+ bounding radial  pulses bilaterally.  ABDOMEN:  Abdomen is soft, obese.  She does have right upper quadrant  pain.  She does not have any rebound.  She has no diffuse peritoneal  signs.  She does have a Murphy sign which is elicited with deep  inspirations.  No masses, no hernias are appreciated.  EXTREMITIES:  Warm and dry.   PERTINENT RADIOGRAPHIC AND LABORATORY RESULTS:  CBC;  white blood cell  count 13.5, hemoglobin 12.1, hematocrit 36.8, and platelets 324.  Basic  metabolic panel; sodium 137, potassium 4.0, chloride 103, bicarb 20, BUN  7, creatinine 0.772, blood glucose 111, alkaline phosphatase is 86,  direct bilirubin is 0.2, total bilirubin is 0.6, AST is 38, ALT is 32,  amylase and lipase are within normal limits.  She had a HIDA scan on Jan 21, 2008, which did demonstrate nonfilling gallbladder and a previous  right upper quadrant ultrasound which did not demonstrate any stones or  biliary tree dilatation.  However, it was noted by the radiologist that  this was a difficult exam and was somewhat inhibited by the patient's  body habitus.  MRCP which was obtained on this admission does  demonstrate gallstones within the gallbladder.  There is no evidence of  any common bile duct stones and no ductal dilatation.  Urinalysis shows  positive blood, positive leukocytes, positive bacteria.   ASSESSMENT/PLAN:  1. Abdominal pain.  2. Cholelithiasis.  3. Peptic ulcer disease.  4. Urinary tract infection.   At this point, I do have a  suspicion that the patient's symptomatology  is probably related to her peptic ulcer disease.  However, based on the  MRCP findings, as well as the patient's classic right upper quadrant  pain, I would also agree that some of her symptomatology is probably  truly biliary in nature.  Risks, benefits, and alternatives of a  laparoscopic possible open cholecystectomy were discussed at length with  the patient including but not limited to risk of bleeding, infection,  bile leak, small bowel injury, common bile duct injury as well as the  possibility of intraoperative pulmonary cardiac events.  At this point,  I would recommend continue the patient on IV fluid hydration, continuing  to keep the patient on an n.p.o. status after midnight tonight and  proceeding with a laparoscopic possible open cholecystectomy in the next  24 hours.  The patient's questions and concerns were addressed and the  patient will be consented for the planned procedure.   I appreciate the opportunity to assist in this patient's care.  It was  discussed with the patient that should her pain persist, then additional  workup for her ulcer disease as well as continue treatment for the  urinary tract infection which is demonstrated with the positive blood,  leukocytes and bacteria found on her urinalysis would likely be  continued.  At this point, we will proceed with cholecystectomy in the  next 24 hours.      Tilford Pillar, MD  Electronically Signed     BZ/MEDQ  D:  02/27/2008  T:  02/28/2008  Job:  [id number]   cc:   Kassie Mends, M.D.  [address]  [place] , [place] [address]

## 2011-01-24 NOTE — Discharge Summary (Signed)
[name], [name]              ACCOUNT NO.:  [id number]   MEDICAL RECORD NO.:  [id number]          PATIENT TYPE:  INP   LOCATION:  9320                          FACILITY:  [facility]   PHYSICIAN:  Allie Bossier, MD        DATE OF BIRTH:  21-May-1981   DATE OF ADMISSION:  05/06/2007  DATE OF DISCHARGE:  05/09/2007                               DISCHARGE SUMMARY   ADMISSION DIAGNOSIS:  Fever to 103 degrees Fahrenheit in the MAU,  evidence of retained products of conception, postpartum day #4 from  spontaneous vaginal delivery, complicated by preeclampsia.   DISCHARGE DIAGNOSIS:  1. On postoperative day #2, status post dilatation and evacuation      operatively for retained products of conception.  2. Anemia.   HOSPITAL COURSE:  Patient was seen in the MAU the late hours of May 06, 2007, was admitted to the hospital in the early morning hours of  May 07, 2007 with a fever up to 103 degrees Fahrenheit and  complaining of fevers and chills.  Patient also complained of passing a  palm-sized blood clot on August 25th.  CBC was performed on admission on  the late hours of August 25th at approximately 2200, and a hemoglobin  was noted to be 7.3.  Differential showed a left shift with 88%  neutrophils.  Other labs drawn on admission were unremarkable, including  a PIH panel.  LDH was noted to be 242.  The uric acid was mildly  elevated at 7.1.  Complete metabolic panel was remarkable only for AST  of 44, ALT 36.   On admission to the hospital, the patient was also noted to be actively  bleeding a large amount to her pad and accordingly was taken to the  operating room for dilation and evacuation.  Operative report reports  that estimated blood loss was approximately 800 ml on the pad prior to  the operation and approximately 200 ml during the operation itself.  Intraoperatively, findings were consistent with retained products of  conception, noting a 4 x 4 circular piece of material that  was  consistent with a placenta.  Multiple blood clots in the vagina and  cervical os, and lower uterine segment were noted as well.  During the  operation, the uterus was also noted to be boggy; therefore, Pitocin was  started, and vigorous fundal massage was performed with visible  contraction of the uterus, and 1000 mcg of Cytotec was given as well per  rectum, and patient received Methergine for 24 hours as well.  Postoperatively, patient received a total of 4 units of blood to correct  her anemia and blood loss.   Postoperatively, the patient recovered well.  A CBC drawn immediately  after the procedure at approximately 4:00 a.m. on August 27th showed a  hemoglobin of 7.3.  This was repeated about two hours later and showed a  hemoglobin of 7.9.  Coags at that time showed a PTT of 33, a PT of 14.1,  and an INR of 1.1.  Fibrinogen level drawn that same morning, August  27th, showed  a fibrinogen level of 457.  Blood cultures drawn on  admission at the time of discharge continued to be negative.  Urine  culture drawn on admission also showed no growth as well.   On the day of discharge, May 09, 2007, patient had another CBC  performed that showed a white blood cell count of 12.4, hemoglobin 7.1,  and a platelet count of 263.  Patient continued to be stable throughout  her postoperative course and remained afebrile for 24 hours prior to  discharge.  Postoperatively, the patient was maintained on ampicillin,  gentamicin, and clindamycin.  These antibiotics were discontinued on the  date of discharge, as the patient continued to be afebrile.  On the date  of discharge, the patient was exhibiting no orthostatic symptoms, was  ambulating without difficulty, tolerating good oral intake, and had a  negative urine output eight hours prior to discharge.  Orthostatic blood  pressures were checked on the date of discharge and noted to be within  normal limits.  Patient was discussed with Dr.  Okey Dupre, attending for the  day, and was assessed as ready for discharge to home, given her  uncomplicated postoperative course and continued afebrile status.   Exam was benign.  Abdomen was nontender.  Uterine fundus was difficult  to palpate given the patient's significant obesity.  Vital signs on the  date of discharge showed a temperature of 97.6, heart rate 95,  respiratory rate 20, and blood pressure 130/88.   DISCHARGE MEDICATIONS:  Patient is to continue taking all medications  previously given postpartum, including ibuprofen 800 mg q.8h. p.r.n.,  Colace 100 mg p.o. b.i.d. p.r.n., and iron p.o. b.i.d.  No new  medications were added or changed at the time of discharge.   DISCHARGE INSTRUCTIONS:  1. Patient is to take medications listed previously.  2. Patient is to increase activity slowly and as tolerated.  3. Patient is to return to the MAU or seek medical care for any      concerning symptoms, including return of fevers and chills,      increased pain or persistent pain not controlled with medication,      any lightheadedness or dizziness, difficulty walking or presyncopal      symptoms, or any persistent or increased bleeding.  Patient is also      to return for any concerning symptoms.  4. Patient is to follow up routinely with [facility]      Department six weeks postpartum unless she experiences any of the      aforementioned symptoms or has a need for medical care in the      interim.   DISCHARGE CONDITION:  Stable and good.      Myrtie Soman, MD      Allie Bossier, MD  Electronically Signed    TE/MEDQ  D:  05/09/2007  T:  05/09/2007  Job:  [id number]

## 2011-01-24 NOTE — H&P (Signed)
[name], RABOLD              ACCOUNT NO.:  [id number]   MEDICAL RECORD NO.:  [id number]          PATIENT TYPE:  INP   LOCATION:  9165                          FACILITY:  [facility]   PHYSICIAN:  Tilda Burrow, M.D. DATE OF BIRTH:  January 07, 1981   DATE OF ADMISSION:  05/01/2007  DATE OF DISCHARGE:                              HISTORY & PHYSICAL   ADMISSION DIAGNOSES:  1. Pregnancy at [redacted] weeks gestation.  2. Mild pre-eclampsia with marked proteinuria.  3. Medically indicated induction of labor.   HISTORY OF PRESENT ILLNESS:  This 30 year old female, gravida 4, para 1,  AB 1, LMP August 09, 2007, placed in menstrual [facility] May 15, 2007  with corresponding first and second and early third trimester  ultrasounds, is admitted after pregnancy course followed through our  office at [facility] through 16 prenatal visits. Notably, the  patient has had intermittent proteinuria through the pregnancy. In  January, she had 135 mg per day of protein on 24 hour urine collection.  In the last 3 weeks, she has had dramatic increase her proteinuria to  462 mg of protein on August 13th and 945 mg per day on today, the 20th.  She has begun to have headaches, which have been mild for the last few  days. She denies right upper quadrant pain. She still has normal  reflexes. She is admitted for induction of labor, as I feel she is  becoming pre-eclamptic. Her blood pressure today is 136/84.   PAST MEDICAL HISTORY:  Morbid obesity.   PAST SURGICAL HISTORY:  Benign other than 2002 surgery for ectopic  pregnancy.   OBSTETRIC HISTORY:  Also notable for 8 hour labor in 1996, when she had  a baby, the same day that she was 2 cm at the initial office visit that  morning. Came back in labor that afternoon adn delivered before  midnight.   PRENATAL LABORATORY DATA:  Blood type O positive. Urine drug screen  negative. Rubella immune. Varicella immunity present. Hemoglobin and  hematocrit 13 and 38.  Hepatitis, human immunodeficiency virus, RPR,  gonorrhea and Chlamydia, all negative.  __________ class 1. MSA FA  declined. Group B strep test negative on April 05, 2007. Glucose  tolerance test 113 mg percent. She has had no glucosuria throughout the  entire pregnancy.   PHYSICAL EXAMINATION:  GENERAL:  A morbidly obese African-American  female who is beginning to show facial edema.  VITAL SIGNS:  Blood pressure 136/84. Weight 323.  CARDIOVASCULAR:  Examination unremarkable.  ABDOMEN:  40 cm fundal height. Estimated fetal weight 7.5 pounds.  GENITOURINARY:  Cervix fingertip, long and posterior, difficult to reach  due to body habitus.   PLAN:  Admit. Attempt Foley bulb cervical ripening with backup plan of  Cytotec. We will attempt to begin induction with Pitocin after 3 hours.   ADDENDUM:  The patient breast feeding.      Tilda Burrow, M.D.  Electronically Signed     JVF/MEDQ  D:  05/01/2007  T:  05/01/2007  Job:  [id number]   cc:   [facility]

## 2011-01-24 NOTE — Discharge Summary (Signed)
NAMEAERIANNA, [name]              ACCOUNT NO.:  [id number]   MEDICAL RECORD NO.:  [id number]          PATIENT TYPE:  INP   LOCATION:  A312                          FACILITY:  [facility]   PHYSICIAN:  Kassie Mends, M.D.      DATE OF BIRTH:  [birth year]   DATE OF ADMISSION:  03/01/2008  DATE OF DISCHARGE:  06/24/2009LH                               DISCHARGE SUMMARY   DISCHARGE DIAGNOSES:  1. Abdominal pain and vomiting after laparoscopic cholecystectomy,      unclear etiology.  2. Morbid obesity.  3. History of peptic ulcer disease.  4. Microcytic anemia.   HOSPITAL COURSE:  [name] is a 30 year old female who was discharged  and admitted on the same day.  She was discharged on March 01, 2008, and  was tolerating p.o.'s and had no nausea or vomiting.  She arrived home  and approximately 1 hour later began to have some increasing right upper  quadrant abdominal pain along with vomiting.  She returned to the  emergency department for evaluation.  She had a white count of 14.7 and  a mildly elevated AST and ALT of 45 and 70.  Her amylase and lipase were  normal.  CT scan of the abdomen and pelvis with contrast only revealed  postsurgical changes in the gallbladder fossa.  She had a small amount  of free fluid in the right side of her abdomen and a moderate amount of  free fluid in the pelvis.  HIDA scan on March 02, 2008, revealed no  evidence of biliary leak.  She was placed on Zosyn and given around the  clock Zofran with p.r.n. Phenergan.  Dilaudid was used for pain relief.  She remained n.p.o. for approximately 24 hours and then was advanced and  then her diet was advanced.  On the day of discharge, her abdominal pain  is controlled.  Her vomiting has resolved, and she is tolerating a low-  fat diet.   DISCHARGE LABS:  Potassium 3.6, creatinine 0.88, total bili 0.6, alk  phos 101, AST 26, ALT 43, albumin 2.4, white count 9.7, hemoglobin 9.9,  platelet  count 298.   DISCHARGE  MEDICATIONS:  1. Cipro 5 mg q.12 h. for 5 days.  2. Hydrocodone/acetaminophen 5/325, #25, 1-2 p.o. q.4-6 h. as needed      for pain.  3. Phenergan 25 mg tablets 1/2 to 1 p.o. q.6 h. as needed for nausea      and vomiting.      Kassie Mends, M.D.  Electronically Signed     SM/MEDQ  D:  03/05/2008  T:  03/06/2008  Job:  [id number]

## 2011-01-24 NOTE — Discharge Summary (Signed)
NAMESUMAYA, [name]              ACCOUNT NO.:  [id number]   MEDICAL RECORD NO.:  [id number]          PATIENT TYPE:  INP   LOCATION:  A325                          FACILITY:  [facility]   PHYSICIAN:  Dorris Singh, DO    DATE OF BIRTH:  Aug 18, 1981   DATE OF ADMISSION:  03/06/2008  DATE OF DISCHARGE:  06/30/2009LH                               DISCHARGE SUMMARY   ADMISSION DIAGNOSES:  1. Abdominal pain.  2. Intractable nausea and vomiting.   DISCHARGE DIAGNOSIS:  Gastroparesis.  Nausea and vomiting has resolved.  Abdominal pain has resolved status post cholecystectomy.   CONSULTATIONS:  That were made include Dr. Cira Servant and Dr. Jena Gauss of  [facility].   TESTING:  That was done includes a gastric emptying study on June 30th.   Her H&P was done by Dr. Dorris Singh.  Please refer.   HOSPITAL COURSE:  The patient was admitted to the service of [facility]  in which she was kept n.p.o., started on antiemetics, and GI was  consulted.  GI saw the patient and started her on Reglan.  She continued  to improve.  Also, she was started on IV hydration.  It was determined  that as the patient improved on GI's recommended medical management they  decided to a gastric emptying study.  The patient continued to improve,  and on June 30, GI stated that she could be discharged to home.   The medications that she will be sent home on include:  1. Cipro which she stopped taking.  2. Hydrocodone and APAP 5/325 one every 2-4 hours.  3. Phenergan 25 mg half tablet 1 every 6 hours.  4. Reglan 5 mg 30 minutes before meals 3 times a day.   Also, she can eat small meals as well.  She is recommended to follow up  with Dr. Cira Servant in 1-3 weeks to see if this is improved and to return if  any symptoms worsen.      Dorris Singh, DO  Electronically Signed    CB/MEDQ  D:  03/10/2008  T:  03/10/2008  Job:  [id number]

## 2011-01-24 NOTE — Consult Note (Signed)
NAMEATALIA, LITZINGER              ACCOUNT NO.:  [id number]   MEDICAL RECORD NO.:  [id number]          PATIENT TYPE:  OBV   LOCATION:  A336                          FACILITY:  [facility]   PHYSICIAN:  Kassie Mends, M.D.      DATE OF BIRTH:  1981-03-21   DATE OF CONSULTATION:  01/23/2008  DATE OF DISCHARGE:                                 CONSULTATION   PRIMARY PHYSICIAN:  [facility].   REFERRING PHYSICIAN:  Rhae Lerner. Margretta Ditty, M.D.   REASON FOR CONSULTATION:  Abdominal pain and vomiting with hematemesis.   HISTORY OF PRESENT ILLNESS:  Ms. Digangi is a 30 year old female who was  in her usual state of health until May 4.  She works the night shift and  began to have pain in her right scapula that radiated to her right upper  quadrant associated with vomiting.  The pain initially was lasting 10 to  15 minutes, and now when it occurs it can last up to 3 hours.  She  denies using any aspirin, BC, Goody's powders, Aleve or Motrin.  She did  use some ibuprofen over the last couple of days due to being prescribed  it in the emergency department.  Last alcohol use was February 2009.  She stopped drinking alcohol when she had a baby.  She denies any  problems swallowing, heartburn or indigestion.  She had an episode on  May 5 and then again on the 10th.  She also had it on the 12th.  She had  no pain on Wednesday.  Today, the pain began again,  associated with  vomiting, but this time after vomiting she saw bright red blood.  She  described it as a mouthful once.  She denies any diarrhea, chest pain,  shortness of breath, clay-colored stool or white stool.  She did take Ex-  Lax on Monday and Tuesday for a hard stool.   PAST MEDICAL HISTORY:  None.   PAST SURGICAL HISTORY:  1. Surgery for ectopic pregnancy.  2. D&C.   ALLERGIES:  No known drug allergies.   MEDICATIONS:  1. Claritin over-the-counter.  2. Dilaudid.  3. Zofran.  4. Phenergan.   She denies any  additional over-the-counter drug use.   FAMILY HISTORY:  She denies any family history of colon cancer, colon  polyps or gallbladder disease.   SOCIAL HISTORY:  She does not smoke.  She is married.  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  She denies the use of  marijuana or cocaine.  She did receive a blood transfusion in February  2009.  She has multiple tattoos.  Her last tattoo was greater than 2  years ago.   REVIEW OF SYSTEMS:  As per the HPI; otherwise, all systems are negative.   PHYSICAL EXAMINATION:  VITAL SIGNS:  Temperature 98.4, blood pressure  107/87, heart rate 79, respiratory rate 20.  GENERAL:  She is in no apparent distress, alert and oriented x4.  LUNGS:  Clear to auscultation bilaterally.  CARDIOVASCULAR:  Regular rhythm, no murmur, normal S1 and S2.  ABDOMEN:  Bowel sounds are present.  Soft, obese, nontender,  nondistended.  No rebound or guarding.  EXTREMITIES:  No cyanosis or edema.   LABORATORIES:  White count 9.5, hemoglobin 12.1, platelets 320, lipase  23.  Hepatic function panel normal.  Creatinine 0.8.  UA negative.  Urine HCG on Jan 20, 2008, negative.   RADIOGRAPHIC STUDIES:  1. Ultrasound on May 12 showed normal gallbladder without evidence of      stones.  2. HIDA scan on May 12 was equivocal.  The gallbladder did not fill.   ASSESSMENT:  Ms. Neuhaus is a 30 year old female with intermittent  abdominal pain and vomiting, now with hematemesis.  It was associated  with constipation.  The differential diagnosis includes gastritis,  peptic ulcer disease, Mallory-Weiss tear, and a low likelihood of  erosive esophagitis.  Would consider biliary colic in the differential  diagnosis for her presenting symptomatology.  The biliary colic could be  due to sludge or microlithiasis.   Thank you for allowing me to see Ms. Rasmussen in consultation.  My  recommendations follow.   RECOMMENDATIONS:  1. We will perform an EGD today.  If no source  for her abdominal pain,      nausea, vomiting and hematemesis can be identified, then an MRCP      will be obtained tomorrow.  2. She should have pain control and antiemetics.  3. NPO except for ice chips.  4. PPI b.i.d.      Kassie Mends, M.D.  Electronically Signed     SM/MEDQ  D:  01/23/2008  T:  01/23/2008  Job:  [id number]

## 2011-01-24 NOTE — Op Note (Signed)
NAMESUMAYAH, BEARSE              ACCOUNT NO.:  [id number]   MEDICAL RECORD NO.:  [id number]          PATIENT TYPE:  INP   LOCATION:  9320                          FACILITY:  [facility]   PHYSICIAN:  Lesly Dukes, M.D. DATE OF BIRTH:  1981-04-02   DATE OF PROCEDURE:  05/07/2007  DATE OF DISCHARGE:                               OPERATIVE REPORT   PREOPERATIVE DIAGNOSIS:  30 year old female postpartum hemorrhage and  retained products of conception.   POSTOPERATIVE DIAGNOSIS:  30 year old female postpartum hemorrhage and  retained products of conception, uterine atony.   OPERATION PERFORMED:  Dilation and curettage.   SURGEON:  Lesly Dukes, M.D.   ANESTHESIA:  General.   SPECIMENS:  EMC  to pathology.   ESTIMATED BLOOD LOSS:  Approximately 800 mL in her vagina and on pad  prior to operation.  We lost approximately 200 mL during the operation.   COMPLICATIONS:  None.   FINDINGS:  Clots in the vagina and cervical os and lower uterine  segment.  There was one 4 x 4 circular piece of material that was  consistent with placenta.  The uterus was atonic and had very irregular  contour of the endometrium.   DESCRIPTION OF PROCEDURE:  Consent was obtained.  The patient was taken  to the operating room where general anesthesia was induced.  The patient  was placed in dorsal lithotomy position.  The bladder was emptied.  Bimanual was performed and clots were removed from the vagina and os a  piece of placenta was removed.  On bimanual exam one could feel more  irregular contour at the top of the uterus.  A bivalve speculum was  placed into the vagina and the anterior lip of the cervix was grasped  with a tenaculum.  Gentle sharp curettage was performed and some pieces  of the placenta/endometrium were removed.  Suction curettage performed  to empty out the uterus.  The uterus was noted to be boggy.  Pitocin was  started and vigorous fundal massage was done.  The uterus seemed to  contract some.  There was still a slightly irregular contour to the  uterus at the end of the procedure.  However, I do not feel that further  scraping would benefit the patient.  The bleeding is very minimal at  this point.  The patient  was given 1000 mcg of Cytotec in the rectum and already receiving  Methergine for 24 hours.  The patient is on her third unit of blood  transfusions and will be getting 4 total.  At this point CBC, coags were  drawn in the PACU.  If the patient continues to bleed, I will consider  uterine artery embolization.      Lesly Dukes, M.D.  Electronically Signed     KHL/MEDQ  D:  05/07/2007  T:  05/08/2007  Job:  [id number]

## 2011-01-24 NOTE — Group Therapy Note (Signed)
NAMEAMATULLAH, CHRISTY              ACCOUNT NO.:  [id number]   MEDICAL RECORD NO.:  [id number]          PATIENT TYPE:  INP   LOCATION:  A325                          FACILITY:  [facility]   PHYSICIAN:  Dorris Singh, DO    DATE OF BIRTH:  06-23-1981   DATE OF PROCEDURE:  DATE OF DISCHARGE:                                 PROGRESS NOTE   HISTORY:  Ms. Osinski was seen today following her GSE study.  She seems  to be doing well.  Does not have any reports of nausea.  We will start  her back on her low-fat diet 5 times a day.  Anticipate discharge if not  this evening, then the next day based on GI's recommendations.   PHYSICAL EXAMINATION:  VITAL SIGNS are as follows for today:  Temperature 98.3, pulse 65, respirations 16, blood pressure 97/48.  GENERAL:  The patient is a 30 year old [place] American female who is  well-developed, well-nourished in no acute distress.  HEART:  Regular rate and rhythm.  LUNGS:  Clear to auscultation bilaterally.  ABDOMEN:  Soft, nontender and nondistended.  EXTREMITIES:  Positive pulses, no edema.  No cyanosis.   LABORATORY DATA:  No labs.  If pertinent labs are abnormal, she is to  have liver function tests which are normal today.   ASSESSMENT//PLAN:  1. Abdominal pain.  This seems to have resolved.  2. Nausea and vomiting.  This seems to have resolved with      implementation of Reglan.  The patient had a gastric emptying study      done today, await further results.   PLAN:  1. May discharge the patient if not this evening, then first thing in      the morning pending GI's recommendation.  2. We will continue to monitor and follow the patient closely.      Dorris Singh, DO  Electronically Signed     CB/MEDQ  D:  03/10/2008  T:  03/10/2008  Job:  [id number]

## 2011-01-24 NOTE — Group Therapy Note (Signed)
[name], [name]              ACCOUNT NO.:  [id number]   MEDICAL RECORD NO.:  [id number]          PATIENT TYPE:  INP   LOCATION:  A325                          FACILITY:  [facility]   PHYSICIAN:  Dorris Singh, DO    DATE OF BIRTH:  1980-11-21   DATE OF PROCEDURE:  03/08/2008  DATE OF DISCHARGE:                                 PROGRESS NOTE   PRIORITY PROGRESS NOTE   The patient is seen today resting in bed comfortably.  She did have 1  episode of emesis throughout the night.  The patient has been requesting  pain medications per schedule.  Will discuss with consult and also  patient about increasing her pain schedule to allow [place] to get a good  idea of where her discomfort is coming from.  There have been  conflicting reports per patient of her headache or abdominal pain.  We  will stretch out her schedule and allow her to take NSAIDs for any kind  of pain that she is experiencing, and this will give [place] a better  indication of where she may have an etiology for her symptoms currently.   PHYSICAL EXAMINATION:  VITAL SIGNS:  Temperature 98.7, pulse 69,  respirations 20, blood pressure 142/79.  GENERAL:  The patient is a 30 year old, African-American female, who is  well-developed, well-nourished, obese, and in no acute distress.  HEART:  Regular rate and rhythm.  LUNGS:  Clear to auscultation bilaterally.  ABDOMEN:  Diffuse tenderness throughout.  There are minimal bowel  sounds.  EXTREMITIES:  Positive pulses, no edema, ecchymosis, or cyanosis.   Her labs for this morning are as follows:  Her alk-phos is 120 and her  ALT is 38.   ASSESSMENT AND PLAN:  1. Abdominal pain.  We will continue keeping the patient NPO and will      advance her diet and see how she does.  We will also stretch out      her narcotic pain medication.  Also, this runs the risk of getting      her constipated, which may add to her abdominal pain as well so we      will try to give her nonsteroidal  antiinflammatory drugs for any      pain management.  2. Nausea and vomiting.  Currently, she is on several medications.  We      will continue to monitor her with this as well.   Currently, we will continue to monitor and change any management that  needs to be reevaluated.     Dorris Singh, DO  Electronically Signed    CB/MEDQ  D:  03/08/2008  T:  03/08/2008  Job:  [id number]

## 2011-01-24 NOTE — Discharge Summary (Signed)
[name], PANT              ACCOUNT NO.:  [id number]   MEDICAL RECORD NO.:  [id number]          PATIENT TYPE:  OBV   LOCATION:  A336                          FACILITY:  [facility]   PHYSICIAN:  Skeet Latch, DO    DATE OF BIRTH:  Sep 10, 1981   DATE OF ADMISSION:  01/23/2008  DATE OF DISCHARGE:  05/15/2009LH                               DISCHARGE SUMMARY   DISCHARGE DIAGNOSES:  1. Gastritis.  2. Peptic ulcer disease.   BRIEF HOSPITAL COURSE:  This is a 30 year old African American female  who presented with a 10-day history of abdominal pain that radiates to  her right scapula.  The patient is also having some emesis.  She has  pain that last 10-15 minutes up to 3 hours.  The pain was precipitated  by foods especially late at night.  The patient was seen in the  emergency room.  The patient had abdominal ultrasound few days prior,  which was limited due to body habitus,  but was subsequently  unremarkable.  She also had an HIDA scan, but her gallbladder was not  visualized over 2 hours.  Due to the patient's pain and multiples visits  to emergency room, decided she need to evaluate by a Gastroenterology.  The patient was seen by Gastroenterology.  She had an EGD performed by  Dr. Cira Servant.  She had a patchy erythema seen in the antrum with erosions.  She had a normal duodenal bulb and second portion of duodenum.  The  patient was diagnosed with gastritis and peptic ulcer disease.  She was  started on proton pump inhibitor twice daily and advanced to clear  liquids.  The patient was told to avoid any aspirin or NSAIDs for 30  days as well as any anticoagulation.  The patient was maintained on pain  medications.  Today, the patient is pain free, doing well, tolerating  her diet and could be discharged to home.   MEDICATIONS ON DISCHARGE:  Protonix 40 mg twice daily for 1 month, then  I believe once a day.   VITALS ON DISCHARGE:  Temperature is 97.7, pulse 73, respirations 20,  blood pressure 146/98, and sating 96% on room air.   LABORATORY DATA:  White count 7.2, hemoglobin 11.5, hematocrit 34.0, and  platelets 312.  Sodium 138, potassium 4.2, chloride 105, CO2 is 28,  glucose 96, BUN 4, and creatinine 0.80.  Her urinalysis had positive  protein.  Her lipase was 23.  Did have a urine pregnancy test done on  Jan 20, 2008, which was negative.   CONDITION ON DISCHARGE:  Stable.   DISPOSITION:  The patient to be discharged to home.   DISCHARGE INSTRUCTIONS:  The patient is to be on a peptic ulcer disease  and not ulcer or dyspepsia diet.  Handout has been given.  The patient,  I believe, is to follow up with the health department in the next 7-10  days.  The patient is to avoid any gastric irritants as well as to avoid  any aspirin or NSAIDs for the next 30 days as well as no anticoagulation-  type  products for next 7 days.  The patient is to have an office visit  with Gastroenterology, nurse  practitioner, PA in the next 2 months for followup, I believe, this is  March 25 2008, at 10:45 a.m.  The patient is told to return to emergency  room if she has any severe abdominal discomfort.  The patient may return  to work on Jan 27, 2008.      Skeet Latch, DO  Electronically Signed     SM/MEDQ  D:  01/24/2008  T:  01/25/2008  Job:  [id number]

## 2011-01-24 NOTE — H&P (Signed)
[name], [name]              ACCOUNT NO.:  [id number]   MEDICAL RECORD NO.:  [id number]          PATIENT TYPE:  INP   LOCATION:  A325                          FACILITY:  [facility]   PHYSICIAN:  Dorris Singh, DO    DATE OF BIRTH:  [birth year]   DATE OF ADMISSION:  03/06/2008  DATE OF DISCHARGE:  LH                              HISTORY & PHYSICAL   Patient is a 30 year old African American female who reported to the  [facility] Emergency Room with a chronic complaint of nausea and  vomiting.  The patient has been seen at [facility] consistently  since Jan 20, 2008, for this same complaint.  On the 14th, she was  admitted to the service of [facility] and she was then discharged the  next day for gastritis and peptic ulcer.  Since then, on Jan 25, 2008,  she was also seen in the emergency room as well.  The patient was  admitted to the service of Dr. Jena Gauss and Dr. Cira Servant for acute abdominal  pain, nausea and vomiting and it was determined that this could be  related to cholecystitis in which she had her gallbladder removed and  was then discharged on the 21st.  The patient then returned back on the  21st with the same complaint of nausea, vomiting, fever, chills, status  post cholecystectomy and was readmitted by Dr. Luvenia Starch service.  On the  25th, she was then discharged.  The patient returned today complaining  of the same problem, uncontrollable nausea and vomiting, irretractable  abdominal pain and nausea and vomiting.  She was seen by the ER  physician who contacted Dr. Jena Gauss as well and contacted the [facility]  physician.   PAST MEDICAL HISTORY:  1. She is status post cholecystectomy less than 7 days ago.  2. She has had a laparotomy for ectopic pregnancy.  3. Gastritis.  4. Ulcers.  5. Cholecystitis.   She is a nonsmoker, nondrinker.  No drug abuse.  Currently married.   ALLERGIES:  No allergies to any medications.   MEDICATIONS:  1. Protonix 40 mg p.o. b.i.d.  2. Hydrocodone/acetaminophen as needed.  3. Cipro, she is taking for an upper respiratory infection.   REVIEW OF SYSTEMS:  Negative for fever and chills but positive for  appetite changes.  EYES:  Negative for eye pain.  EARS, NOSE, MOUTH and  THROAT:  Negative for ear pain.  CARDIOVASCULAR:  Negative for chest  pain.  RESPIRATORY:  Negative for cough, dyspnea or wheezing.  GASTROINTESTINAL:  Positive for nausea, vomiting, abdominal pain.  GU:  Negative for dysuria or flank pain.  MUSCULOSKELETAL:  Negative for  arthralgias or back pain.  SKIN:  Negative for rash.  NEURO:  Negative  for headache.   PHYSICAL EXAMINATION:  VITAL SIGNS:  Blood pressure 132/81, pulse rate  88, respirations 19, temperature 100.1.  GENERAL:  The patient is an obese 30 year old African female who is well-  developed, well-nourished in no acute distress.  Her weight at previous  discharge was 134 kg.  HEENT:  Her head is normocephalic, atraumatic.  HEENT: Noncontributory.  HEART:  Regular rate and rhythm.  No murmurs, rubs or gallops.  LUNGS:  Clear to auscultation bilaterally.  ABDOMEN:  Obese, round, soft, nondistended, nondistended.  Bowel sounds  present.  Positive tenderness diffusely across the abdomen.  No rebound  tenderness noted.  EXTREMITIES:  No edema, cyanosis or ecchymosis.  NEURO:  Cranial nerves II-XII grossly intact.   LABORATORY DATA:  Labs that were done include a urine which was  negative.  CBC white count 12, hemoglobin 11.5, hematocrit 35.2,  platelet count of 470.  Sodium was 139, potassium 3.7, chloride 104,  carbon dioxide 25, glucose 122, BUN 5 and creatinine 0.5.   ASSESSMENT/PLAN:  1. Abdominal pain.  2. Irretractable nausea and vomiting.   PLAN:  1. Admit patient to service of [facility].  2. Consult gastroenterology.  3. Will keep the patient n.p.o. and hydrate with IV fluids.  4. Will also start her on scheduled antiemetics and await any further      recommendations that  GI may have for this patient.      Dorris Singh, DO  Electronically Signed     CB/MEDQ  D:  03/06/2008  T:  03/06/2008  Job:  [id number]

## 2011-01-24 NOTE — Op Note (Signed)
[name], [name]              ACCOUNT NO.:  [id number]   MEDICAL RECORD NO.:  [id number]          PATIENT TYPE:  OBV   LOCATION:  A336                          FACILITY:  [facility]   PHYSICIAN:  Kassie Mends, M.D.      DATE OF BIRTH:  Feb 13, 1981   DATE OF PROCEDURE:  01/23/2008  DATE OF DISCHARGE:                               OPERATIVE REPORT   REFERRING PHYSICIAN:  Skeet Latch, DO.   PRIMARY PHYSICIAN:  [facility].   PROCEDURE:  Esophagogastroduodenoscopy with cold forceps biopsy.   INDICATION FOR EXAM:  [name] is a 30 year old female who complains of  severe abdominal pain and vomiting.  She has been maintained on  ibuprofen.  The symptoms have been persistent for 10 days.   FINDINGS:  1. A 6 mm x 3 mm ulcer seen in the gastric body.  Biopsies obtained      via cold forceps.  Patchy erythema seen in the antrum with rare      erosion.  Biopsies obtained via cold forceps to evaluate for H.      pylori gastritis.  2. Normal duodenal bulb and second portion of the duodenum.   DIAGNOSIS:  [name] likely has abdominal pain, nausea, vomiting and  hematemesis from gastritis and peptic ulcer disease.  She has a low  likelihood of her symptoms being due to biliary colic/acalculous  cholecystitis or idopathic gastroparesis.   RECOMMENDATIONS:  1. Twice daily proton pump inhibitor, and advance to clear liquids.      If she can tolerate clear liquids she may be discharged to home.  2. Would have a low threshold to obtain an endoscopic ultrasound as an      outpatient if her symptoms did not improve.  3. She should avoid aspirin and NSAIDs for 30 days.  No      anticoagulation for 7 days.  4. Hydrocodone 1-2 tablets every 6 hours as needed for pain.  5. Will need a fasting gastrin level drawn.   MEDICATIONS:  1. Demerol 75 mg IV.  2. Versed 6 mg IV.   PROCEDURE TECHNIQUE:  Physical exam was performed and informed consent  was obtained from the  patient after explaining the benefits, risks and  alternatives to the procedure.  The patient was connected to the monitor  and placed in the left lateral position.  Continuous oxygen was provided  by nasal cannula and IV medicine administered through an indwelling  cannula.  After administration of sedation, the patient's esophagus was  intubated and the scope was advanced under direct visualization to the  second portion of the duodenum.  The scope was removed slowly by  carefully examining the color, texture, anatomy and integrity of the  mucosa on the way out.  The patient was recovered in endoscopy and  discharged to the floor in satisfactory condition.   ADDENDUM:  Gastrin WNLs. Biopsies benign. Continu BID PPI for one omnth  then daily. If vomiting persists will need a GES and/or EUS. OPV w/ [facility]  in late June or early July.      [employer],  M.D.  Electronically Signed     SM/MEDQ  D:  01/24/2008  T:  01/24/2008  Job:  [id number]

## 2011-01-24 NOTE — Op Note (Signed)
[name], [name]              ACCOUNT NO.:  [id number]   MEDICAL RECORD NO.:  [id number]          PATIENT TYPE:  INP   LOCATION:  A315                           FACILITY:   PHYSICIAN:  Tilford Pillar, MD      DATE OF BIRTH:  [birth year]   DATE OF PROCEDURE:  02/28/2008  DATE OF DISCHARGE:  03/01/2008                               OPERATIVE REPORT   PREOPERATIVE DIAGNOSIS:  Cholelithiasis.   POSTOPERATIVE DIAGNOSIS:  Cholelithiasis with hydrops.   PROCEDURE:  Laparoscopic cholecystectomy.   SURGEON:  Tilford Pillar, MD   ANESTHESIA:  General local anesthetic, 0.5% Sensorcaine plain.   SPECIMEN:  Gallbladder.   ESTIMATED BLOOD LOSS:  Less than 100 mL.   COMPLICATIONS:  None.   DISPOSITION:  Postanesthetic Care Unit.   INDICATIONS:  The patient is a 30 year old female presenting to [facility] with onset of acute abdominal pain, nausea, and vomiting.  She had been evaluated extensively by Gastroenterology on her admission.  She was demonstrated to have cholelithiasis.  Additionally, had a HIDA  scan which demonstrated no evidence of any gallbladder filling.  Based  on her symptomatology, however, her symptoms were not clearly defined as  biliary in nature; however, as she continued to have persistent  epigastric and right upper quadrant abdominal pain and persistent nausea  and vomiting, it was discussed with the patient the risk, benefits, and  alternatives of a laparoscopic possible open cholecystectomy. The  patient's questions and concerns were addressed and the patient has  consented for planned procedure.   OPERATION:  The patient was taken to the operating room and was placed  in a supine position on the operating table, at which time the general  anesthetic was administered by anesthesia.  Once the patient was asleep,  she was then endotracheally intubated by anesthesia.  At this point, her  abdomen was prepped and draped in the usual fashion.  A  standard  incision was created supraumbilically with a scalpel.  Additional  dissection down to the subcutaneous tissue was carried out using a  Kocher clamp which was utilized to grasp the anterior abdominal fascia  anteriorly.  A Veress needle was inserted.  Saline drop test was  utilized to confirm intraperitoneal placement and then pneumoperitoneum  was initiated.  Once sufficient pneumoperitoneum was obtained, an 11-mm  trocar was placed over the laparoscope allowing visualization of the  trocar entering into the peritoneum.  At this point, the inner cannula  was removed.  The laparoscope was reinserted.  There was no evidence of  any Veress or trocar placement injury at this time.  The remaining  trocars were placed; the 11-mm trocar in the epigastrium, the 5-mm  trocar in the midline between two 11-mm trocars, and a 5-mm trocar in  the right lateral abdominal wall.  At this time, the patient was placed  in a reverse Trendelenburg left lateral decubitus position.  The fundus  of the gallbladder was lifted up and over the right lobe of the liver.  A [place] dissector was utilized to bluntly strip the peritoneal  reflection off the infundibulum  of the gallbladder.  The gallbladder was  noted to be extremely distended, at which point needle aspiration of the  gallbladder was undertaken and the resultant fluid was clear.  Fluid  consistent with hydrops.  Upon decompression of the gallbladder,  continued dissection was undertaken.  The cystic duct was identified  entering into the infundibulum.  A window was created behind the cystic  duct.  Three EndoClips were placed proximally, one distally, and the  cystic duct was then divided between the two most distal clips.  After  ensuring with the gentle squeezing of the clamp, there were no cystic  duct stones.  Similarly, the cystic artery was identified.  Two  EndoClips were placed proximally, one distally, and the cystic artery  was  divided between two most distal clips.  At this time, electrocautery  was utilized to dissect the gallbladder free from the gallbladder fossa.  Once it was free, it was placed in an EndoCatch bag and was placed up  and over the right lobe of the liver.  At this point, hemostasis was  obtained with electrocautery.  A piece of Surgicel was placed into the  gallbladder fossa.  The  peritoneal cavity was irrigated and aspirated  until the returning aspirate was clear and attention was turned to  closure.   Using a Endoclose suture passing device, a 2-0 Vicryl suture was passed  through both the 11-mm trocar sites.  With these 2 sutures in place, the  gallbladder was removed through the epigastric trocar site.  Additional  sharp and blunt dissection was required to enlarge the trocar site  adequately enough to remove the gallbladder.  Once the gallbladder was  free, the pneumoperitoneum was evacuated.  The Vicryl sutures were  secured.  The skin edges were reapproximated with 4-0 Monocryl in a  running subcuticular tissue.  The local anesthetic was injected.  The  skin was washed and dried with a moist and dry towel.  Benzoin was  applied around the incision; 1/2-inch Steri-Strips were placed, and the  patient was allowed to come out of general anesthetic and she was  transferred to the Postanesthetic Care Unit in stable condition.  At the  conclusion of the procedure, all instruments, sponge, and needle counts  were correct.  The patient tolerated the procedure well.      Tilford Pillar, MD  Electronically Signed     BZ/MEDQ  D:  04/10/2008  T:  04/11/2008  Job:  [id number]

## 2011-01-24 NOTE — Group Therapy Note (Signed)
[name], [name]              ACCOUNT NO.:  [id number]   MEDICAL RECORD NO.:  [id number]          PATIENT TYPE:  INP   LOCATION:  A325                          FACILITY:  [facility]   PHYSICIAN:  Dorris Singh, DO    DATE OF BIRTH:  January 07, 1981   DATE OF PROCEDURE:  03/07/2008  DATE OF DISCHARGE:                                 PROGRESS NOTE   The patient is seen today after being seen by GI who adjusted some of  her antiemetics, placed her on Reglan.  She seemed to do well without  any more episodes of emesis.  Also discussed with the GI PA as to when  to start clear liquids.  We will go ahead and start them in the morning  to see how the patient does over the next 24 hours n.p.o.   PHYSICAL EXAMINATION:  VITAL SIGNS:  The patient vitals are as follows,  her temperature is 98.3, pulse was 64, respirations 18, blood pressure  143/80.  GENERAL:  The patient is a 30 year old African American female who is  obese, well-developed, well-nourished, in no acute distress.  HEART:  Regular rate and rhythm.  LUNGS:  Clear to auscultation bilaterally.  ABDOMEN:  Some diffuse tenderness on palpation.  EXTREMITIES:  Positive pulses.  No edema, ecchymosis or cyanosis.   LABS:  For today are as follows, white count of 10.7, hemoglobin of  10.1, hematocrit of 30.9 and a platelet count of 393.  Sodium is 141,  potassium 4.0, chloride 108, CO2 27, glucose 88, BUN 7, creatinine 0.86.   ASSESSMENT AND PLAN:  Retractable nausea and vomiting and abdominal  pain.  The patient's abdominal pain has resolved.  Will continue to keep  her n.p.o. for another 12-18 hours, will reassess her in the next day  and then discuss this with GI as far as the care that they would like to  give her.  Will continue to follow and change therapy as appropriate.      Dorris Singh, DO  Electronically Signed     CB/MEDQ  D:  03/07/2008  T:  03/07/2008  Job:  [id number]

## 2011-06-07 LAB — URINE MICROSCOPIC-ADD ON

## 2011-06-07 LAB — DIFFERENTIAL
Basophils Absolute: 0
Basophils Absolute: 0.1
Basophils Relative: 0
Basophils Relative: 0
Basophils Relative: 1
Eosinophils Absolute: 0.2
Eosinophils Absolute: 0.3
Eosinophils Absolute: 0.4
Eosinophils Relative: 2
Eosinophils Relative: 3
Eosinophils Relative: 5
Lymphocytes Relative: 41
Lymphs Abs: 1.7
Lymphs Abs: 3
Monocytes Absolute: 0.6
Monocytes Absolute: 0.8
Monocytes Relative: 7
Monocytes Relative: 11
Neutro Abs: 2.9
Neutrophils Relative %: 42 — ABNORMAL LOW

## 2011-06-07 LAB — CBC
HCT: 34 — ABNORMAL LOW
Hemoglobin: 12.1
Hemoglobin: 12.1
Hemoglobin: 11.5 — ABNORMAL LOW
MCHC: 33.2
MCHC: 34
MCV: 80.7
Platelets: 320
Platelets: 312
RBC: 4.8
RBC: 4.21
RDW: 17.1 — ABNORMAL HIGH
RDW: 17.2 — ABNORMAL HIGH
WBC: 12.1 — ABNORMAL HIGH
WBC: 7.2

## 2011-06-07 LAB — URINALYSIS, ROUTINE W REFLEX MICROSCOPIC
Bilirubin Urine: NEGATIVE
Bilirubin Urine: NEGATIVE
Bilirubin Urine: NEGATIVE
Ketones, ur: NEGATIVE
Ketones, ur: NEGATIVE
Nitrite: NEGATIVE
Nitrite: NEGATIVE
Specific Gravity, Urine: 1.025
Specific Gravity, Urine: >1.03 — ABNORMAL HIGH
Urobilinogen, UA: 0.2
Urobilinogen, UA: 0.2
pH: 5.5
pH: 6
pH: 7

## 2011-06-07 LAB — COMPREHENSIVE METABOLIC PANEL
ALT: 20
ALT: 21
AST: 19
AST: 25
Albumin: 3.6
Alkaline Phosphatase: 100
Alkaline Phosphatase: 99
CO2: 25
Calcium: 9
Chloride: 103
GFR calc Af Amer: >60
GFR calc Af Amer: >60
GFR calc non Af Amer: >60
Glucose, Bld: 104 — ABNORMAL HIGH
Glucose, Bld: 116 — ABNORMAL HIGH
Potassium: 3.6
Sodium: 136
Sodium: 139
Total Bilirubin: 0.4
Total Protein: 7.4

## 2011-06-07 LAB — BASIC METABOLIC PANEL
BUN: 4 — ABNORMAL LOW
CO2: 28
Calcium: 8.9
Chloride: 105
Creatinine, Ser: 0.8
GFR calc Af Amer: >60
GFR calc non Af Amer: >60
Glucose, Bld: 96
Potassium: 4.2
Sodium: 138

## 2011-06-07 LAB — LIPASE, BLOOD: Lipase: 29

## 2011-06-07 LAB — PREGNANCY, URINE: Preg Test, Ur: NEGATIVE

## 2011-06-07 LAB — GASTRIN: Gastrin: 17 pg/mL (ref 13–115)

## 2011-06-08 LAB — URINALYSIS, ROUTINE W REFLEX MICROSCOPIC
Bilirubin Urine: NEGATIVE
Glucose, UA: NEGATIVE
Glucose, UA: NEGATIVE
Hgb urine dipstick: NEGATIVE
Ketones, ur: 15 — AB
Ketones, ur: NEGATIVE
Leukocytes, UA: NEGATIVE
Leukocytes, UA: NEGATIVE
Nitrite: NEGATIVE
Nitrite: NEGATIVE
Protein, ur: 100 — AB
Specific Gravity, Urine: 1.025
Specific Gravity, Urine: >1.03 — ABNORMAL HIGH
Specific Gravity, Urine: >1.03 — ABNORMAL HIGH
Urobilinogen, UA: 0.2
Urobilinogen, UA: 0.2
pH: 5.5
pH: 7.5

## 2011-06-08 LAB — HEPATIC FUNCTION PANEL
ALT: 27
ALT: 38 — ABNORMAL HIGH
ALT: 43 — ABNORMAL HIGH
ALT: 70 — ABNORMAL HIGH
AST: 17
AST: 24
AST: 26
AST: 29
Albumin: 2.4 — ABNORMAL LOW
Albumin: 2.8 — ABNORMAL LOW
Albumin: 3.3 — ABNORMAL LOW
Alkaline Phosphatase: 101
Alkaline Phosphatase: 104
Alkaline Phosphatase: 117
Alkaline Phosphatase: 120 — ABNORMAL HIGH
Alkaline Phosphatase: 86
Bilirubin, Direct: 0.1
Bilirubin, Direct: 0.1
Bilirubin, Direct: 0.1
Bilirubin, Direct: 0.3
Indirect Bilirubin: 0.3
Indirect Bilirubin: 0.4
Indirect Bilirubin: 0.4
Indirect Bilirubin: 0.5
Indirect Bilirubin: 0.6
Total Bilirubin: 0.4
Total Bilirubin: 0.5
Total Bilirubin: 0.6
Total Bilirubin: 0.8
Total Protein: 5.9 — ABNORMAL LOW
Total Protein: 6.2
Total Protein: 6.3
Total Protein: 7.3

## 2011-06-08 LAB — RAPID URINE DRUG SCREEN, HOSP PERFORMED
Amphetamines: NOT DETECTED
Amphetamines: NOT DETECTED
Barbiturates: NOT DETECTED
Benzodiazepines: NOT DETECTED
Benzodiazepines: NOT DETECTED
Cocaine: NOT DETECTED
Opiates: POSITIVE — AB
Tetrahydrocannabinol: NOT DETECTED
Tetrahydrocannabinol: NOT DETECTED

## 2011-06-08 LAB — DIFFERENTIAL
Basophils Absolute: 0
Basophils Absolute: 0
Basophils Absolute: 0.1
Basophils Absolute: 0.1
Basophils Absolute: 0.2 — ABNORMAL HIGH
Basophils Absolute: 0
Basophils Relative: 0
Basophils Relative: 0
Basophils Relative: 1
Basophils Relative: 1
Basophils Relative: 1
Basophils Relative: 0
Eosinophils Absolute: 0
Eosinophils Absolute: 0
Eosinophils Absolute: 0.5
Eosinophils Absolute: 0.5
Eosinophils Relative: 0
Eosinophils Relative: 1
Eosinophils Relative: 1
Eosinophils Relative: 3
Eosinophils Relative: 5
Eosinophils Relative: 6 — ABNORMAL HIGH
Lymphocytes Relative: 18
Lymphocytes Relative: 20
Lymphocytes Relative: 25
Lymphocytes Relative: 26
Lymphocytes Relative: 8 — ABNORMAL LOW
Lymphs Abs: 0.9
Lymphs Abs: 1.2
Lymphs Abs: 1.5
Lymphs Abs: 1.9
Lymphs Abs: 2
Lymphs Abs: 2.6
Lymphs Abs: 2.7
Monocytes Absolute: 0.4
Monocytes Absolute: 0.7
Monocytes Absolute: 0.8
Monocytes Absolute: 1
Monocytes Absolute: 1.4 — ABNORMAL HIGH
Monocytes Relative: 12
Monocytes Relative: 4
Monocytes Relative: 6
Monocytes Relative: 9
Monocytes Relative: 3
Neutro Abs: 10.4 — ABNORMAL HIGH
Neutro Abs: 6
Neutro Abs: 6.5
Neutro Abs: 8.5 — ABNORMAL HIGH
Neutro Abs: 10.8 — ABNORMAL HIGH
Neutrophils Relative %: 71
Neutrophils Relative %: 78 — ABNORMAL HIGH
Neutrophils Relative %: 88 — ABNORMAL HIGH
Neutrophils Relative %: 88 — ABNORMAL HIGH

## 2011-06-08 LAB — BASIC METABOLIC PANEL
BUN: 5 — ABNORMAL LOW
BUN: 5 — ABNORMAL LOW
BUN: 6
BUN: 7
CO2: 25
CO2: 26
CO2: 31
Calcium: 8.6
Calcium: 8.9
Chloride: 101
Chloride: 103
Chloride: 104
Creatinine, Ser: 0.74
Creatinine, Ser: 0.83
GFR calc Af Amer: >60
GFR calc Af Amer: >60
GFR calc Af Amer: >60
GFR calc non Af Amer: >60
GFR calc non Af Amer: >60
GFR calc non Af Amer: >60
Glucose, Bld: 111 — ABNORMAL HIGH
Glucose, Bld: 92
Glucose, Bld: 98
Potassium: 3.6
Potassium: 3.6
Potassium: 3.7
Potassium: 3.7
Potassium: 3.9
Potassium: 4
Sodium: 137
Sodium: 138
Sodium: 139
Sodium: 143

## 2011-06-08 LAB — CBC
HCT: 29.5 — ABNORMAL LOW
HCT: 30.9 — ABNORMAL LOW
HCT: 31.8 — ABNORMAL LOW
HCT: 32.9 — ABNORMAL LOW
HCT: 34.1 — ABNORMAL LOW
HCT: 36.8
HCT: 40.7
Hemoglobin: 11 — ABNORMAL LOW
Hemoglobin: 11.5 — ABNORMAL LOW
Hemoglobin: 13
MCHC: 32.5
MCHC: 32.7
MCHC: 32.7
MCHC: 32.8
MCHC: 32.8
MCHC: 32.9
MCHC: 33.4
MCHC: 32
MCV: 77.8 — ABNORMAL LOW
MCV: 78
MCV: 78.1
MCV: 78.1
MCV: 78.2
MCV: 78.4
Platelets: 279
Platelets: 298
Platelets: 301
Platelets: 323
Platelets: 324
Platelets: 379
Platelets: 393
RBC: 4.39
RBC: 4.49
RBC: 4.53
RDW: 16.7 — ABNORMAL HIGH
RDW: 16.8 — ABNORMAL HIGH
RDW: 17.2 — ABNORMAL HIGH
RDW: 17.3 — ABNORMAL HIGH
RDW: 17.5 — ABNORMAL HIGH
WBC: 11.2 — ABNORMAL HIGH
WBC: 12 — ABNORMAL HIGH
WBC: 12.1 — ABNORMAL HIGH
WBC: 13.5 — ABNORMAL HIGH
WBC: 14.7 — ABNORMAL HIGH
WBC: 9.7

## 2011-06-08 LAB — COMPREHENSIVE METABOLIC PANEL
ALT: 19
AST: 23
AST: 24
Albumin: 2.7 — ABNORMAL LOW
Albumin: 3.7
Alkaline Phosphatase: 123 — ABNORMAL HIGH
BUN: 7
BUN: 7
Calcium: 8.9
Calcium: 9
Creatinine, Ser: 0.86
Creatinine, Ser: 0.89
GFR calc Af Amer: >60
GFR calc Af Amer: >60
Glucose, Bld: 118 — ABNORMAL HIGH
Potassium: 3.5
Sodium: 137
Total Bilirubin: 0.6
Total Protein: 5.7 — ABNORMAL LOW
Total Protein: 7.5
Total Protein: 8.5 — ABNORMAL HIGH

## 2011-06-08 LAB — URINE CULTURE
Colony Count: NO GROWTH
Special Requests: NEGATIVE

## 2011-06-08 LAB — CORTISOL-AM, BLOOD: Cortisol - AM: 11.8

## 2011-06-08 LAB — HEMOGLOBIN A1C
Hgb A1c MFr Bld: 5.4
Mean Plasma Glucose: 115

## 2011-06-08 LAB — PREGNANCY, URINE
Preg Test, Ur: NEGATIVE
Preg Test, Ur: NEGATIVE

## 2011-06-08 LAB — LIPASE, BLOOD
Lipase: 25
Lipase: 67 — ABNORMAL HIGH

## 2011-06-08 LAB — URINE MICROSCOPIC-ADD ON

## 2011-06-08 LAB — TSH: TSH: 1.318

## 2011-06-08 LAB — AMYLASE: Amylase: 111

## 2011-06-23 LAB — CROSSMATCH

## 2011-06-23 LAB — URIC ACID
Uric Acid, Serum: 5.8
Uric Acid, Serum: 6
Uric Acid, Serum: 7.1 — ABNORMAL HIGH

## 2011-06-23 LAB — COMPREHENSIVE METABOLIC PANEL
ALT: 15
ALT: 30
AST: 27
AST: 32
Albumin: 2.1 — ABNORMAL LOW
Alkaline Phosphatase: 110
Alkaline Phosphatase: 87
BUN: 11
BUN: 6
BUN: 6
CO2: 20
CO2: 22
CO2: 22
Calcium: 8.2 — ABNORMAL LOW
Chloride: 106
Chloride: 107
Chloride: 110
Creatinine, Ser: 0.65
Creatinine, Ser: 0.81
GFR calc Af Amer: >60
GFR calc non Af Amer: >60
GFR calc non Af Amer: >60
GFR calc non Af Amer: >60
Glucose, Bld: 108 — ABNORMAL HIGH
Potassium: 3.2 — ABNORMAL LOW
Potassium: 4.2
Sodium: 136
Sodium: 139
Total Bilirubin: 0.4
Total Bilirubin: 0.5
Total Bilirubin: 0.8

## 2011-06-23 LAB — URINE CULTURE: Culture: NO GROWTH

## 2011-06-23 LAB — CBC
HCT: 18.7 — ABNORMAL LOW
HCT: 20.4 — ABNORMAL LOW
HCT: 23.2 — ABNORMAL LOW
HCT: 30.7 — ABNORMAL LOW
Hemoglobin: 10.3 — ABNORMAL LOW
Hemoglobin: 10.6 — ABNORMAL LOW
Hemoglobin: 13
Hemoglobin: 7.1 — CL
Hemoglobin: 7.3 — CL
Hemoglobin: 8.3 — ABNORMAL LOW
MCHC: 33.9
MCHC: 34.1
MCHC: 34.4
MCHC: 34.5
MCHC: 35.1
MCV: 88.9
MCV: 89.5
MCV: 90.4
MCV: 92.4
MCV: 92.5
MCV: 93
Platelets: 238
Platelets: 263
Platelets: 292
Platelets: 298
Platelets: 307
RBC: 2.27 — ABNORMAL LOW
RBC: 2.29 — ABNORMAL LOW
RBC: 2.3 — ABNORMAL LOW
RBC: 2.59 — ABNORMAL LOW
RBC: 2.59 — ABNORMAL LOW
RBC: 3.25 — ABNORMAL LOW
RBC: 3.32 — ABNORMAL LOW
RBC: 4.05
RDW: 15 — ABNORMAL HIGH
WBC: 11.4 — ABNORMAL HIGH
WBC: 12.4 — ABNORMAL HIGH
WBC: 13.9 — ABNORMAL HIGH
WBC: 14.9 — ABNORMAL HIGH
WBC: 17.5 — ABNORMAL HIGH
WBC: 17.6 — ABNORMAL HIGH

## 2011-06-23 LAB — URINALYSIS, ROUTINE W REFLEX MICROSCOPIC
Glucose, UA: NEGATIVE
Hgb urine dipstick: NEGATIVE
Leukocytes, UA: NEGATIVE
Specific Gravity, Urine: >1.03 — ABNORMAL HIGH
Urobilinogen, UA: 0.2

## 2011-06-23 LAB — DIFFERENTIAL
Eosinophils Relative: 2
Lymphocytes Relative: 5 — ABNORMAL LOW
Lymphs Abs: 0.7
Monocytes Absolute: 0.8 — ABNORMAL HIGH
Neutro Abs: 13.2 — ABNORMAL HIGH
Neutrophils Relative %: 88 — ABNORMAL HIGH

## 2011-06-23 LAB — MAGNESIUM: Magnesium: 3.3 — ABNORMAL HIGH

## 2011-06-23 LAB — CULTURE, BLOOD (ROUTINE X 2)
Culture: NO GROWTH
Culture: NO GROWTH

## 2011-06-23 LAB — URINE MICROSCOPIC-ADD ON

## 2011-06-23 LAB — PROTIME-INR: Prothrombin Time: 14.1

## 2011-06-23 LAB — APTT: aPTT: 33

## 2011-06-23 LAB — LACTATE DEHYDROGENASE: LDH: 242

## 2011-06-24 ENCOUNTER — Encounter (HOSPITAL_COMMUNITY): Payer: Self-pay | Admitting: *Deleted

## 2011-06-24 ENCOUNTER — Inpatient Hospital Stay (HOSPITAL_COMMUNITY)
Admission: AD | Admit: 2011-06-24 | Discharge: 2011-06-25 | Disposition: A | Discharge status: Home / Self Care | Payer: Medicaid Other | Source: Ambulatory Visit | Attending: Obstetrics and Gynecology | Admitting: Obstetrics and Gynecology

## 2011-06-24 DIAGNOSIS — O479 False labor, unspecified: Secondary | ICD-10-CM

## 2011-06-24 DIAGNOSIS — O10019 Pre-existing essential hypertension complicating pregnancy, unspecified trimester: Secondary | ICD-10-CM | POA: Insufficient documentation

## 2011-06-24 DIAGNOSIS — O0990 Supervision of high risk pregnancy, unspecified, unspecified trimester: Secondary | ICD-10-CM

## 2011-06-24 DIAGNOSIS — R109 Unspecified abdominal pain: Secondary | ICD-10-CM | POA: Insufficient documentation

## 2011-06-24 DIAGNOSIS — O10919 Unspecified pre-existing hypertension complicating pregnancy, unspecified trimester: Secondary | ICD-10-CM

## 2011-06-24 HISTORY — DX: Essential (primary) hypertension: I10

## 2011-06-24 HISTORY — DX: Gestational (pregnancy-induced) hypertension without significant proteinuria, unspecified trimester: O13.9

## 2011-06-24 LAB — COMPREHENSIVE METABOLIC PANEL
ALT: 14 U/L (ref 0–35)
Alkaline Phosphatase: 97 U/L (ref 39–117)
BUN: 11 mg/dL (ref 6–23)
CO2: 24 mEq/L (ref 19–32)
Chloride: 103 mEq/L (ref 96–112)
GFR calc Af Amer: >90 mL/min (ref >90)
GFR calc non Af Amer: >90 mL/min (ref >90)
Glucose, Bld: 100 mg/dL — ABNORMAL HIGH (ref 70–99)
Potassium: 3.8 mEq/L (ref 3.5–5.1)
Sodium: 134 mEq/L — ABNORMAL LOW (ref 135–145)
Total Bilirubin: 0.2 mg/dL — ABNORMAL LOW (ref 0.3–1.2)
Total Protein: 6.4 g/dL (ref 6.0–8.3)

## 2011-06-24 LAB — URINALYSIS, ROUTINE W REFLEX MICROSCOPIC
Bilirubin Urine: NEGATIVE
Hgb urine dipstick: NEGATIVE
Ketones, ur: NEGATIVE mg/dL
Nitrite: NEGATIVE
Specific Gravity, Urine: >1.03 — ABNORMAL HIGH (ref 1.005–1.030)
pH: 6.5 (ref 5.0–8.0)

## 2011-06-24 LAB — URINE MICROSCOPIC-ADD ON

## 2011-06-24 LAB — CBC
HCT: 34 % — ABNORMAL LOW (ref 36.0–46.0)
MCHC: 32.9 g/dL (ref 30.0–36.0)
Platelets: 254 10*3/uL (ref 150–400)
RDW: 15.2 % (ref 11.5–15.5)
WBC: 9.9 10*3/uL (ref 4.0–10.5)

## 2011-06-24 MED ORDER — METHYLDOPA 500 MG PO TABS
500.0000 mg | ORAL_TABLET | ORAL | Status: DC
  Filled 2011-06-24: qty 1

## 2011-06-24 NOTE — ED Provider Notes (Signed)
History     Chief Complaint  Patient presents with  . Abdominal Pain  . Back Pain   HPI This is a 30 year old G4 P2 012 with intrauterine pregnancy at 37 weeks and one-day who presents to the MAU with back pain and abdominal cramps that started this morning and have progressed throughout the day. She states that back pain is intermittent and occurs with the abdominal cramps. The cramping comes approximately every 5 minutes. She rates the contractions as mild. There is no radiation to the pain. She has not tried to take anything to alleviate the pain. There is no decreased fetal activity, leaking fluid, vaginal bleeding, vaginal discharge, vision changes, right upper quadrant pain, nausea, headache. She does take Aldomet for chronic hypertension but lasted this afternoon. She was seen in the office on Thursday and states that her cervix was 2 cm dilated.  OB History    Grav Para Term Preterm Abortions TAB SAB Ect Mult Living   4 2 2  1 1    2       Past Medical History  Diagnosis Date  . Hypertension   . PIH (pregnancy induced hypertension)     Past Surgical History  Procedure Date  . Dilation and curettage of uterus     No family history on file.  History  Substance Use Topics  . Smoking status: Never Smoker   . Smokeless tobacco: Not on file  . Alcohol Use: No    Allergies: No Known Allergies  Prescriptions prior to admission  Medication Sig Dispense Refill  . acetaminophen (TYLENOL) 500 MG tablet Take 500 mg by mouth every 6 (six) hours as needed. For pain       . methyldopa (ALDOMET) 500 MG tablet Take 500 mg by mouth 2 (two) times daily.        . prenatal vitamin w/FE, FA (PRENATAL 1 + 1) 27-1 MG TABS Take 1 tablet by mouth daily.        . valACYclovir (VALTREX) 500 MG tablet Take 500 mg by mouth 2 (two) times daily.          Review of Systems  Constitutional: Negative for fever, chills and weight loss.  Eyes: Negative for blurred vision and double vision.    Cardiovascular: Negative for chest pain and palpitations.  Gastrointestinal: Negative for nausea, vomiting and abdominal pain.  Genitourinary: Negative for dysuria, urgency and frequency.  Neurological: Negative for headaches.   Physical Exam   Blood pressure 142/63, pulse 98, temperature 99.1 F (37.3 C), temperature source Oral, resp. rate 22, height 5\' 6"  (1.676 m), weight 152.862 kg (337 lb), SpO2 98.00%.  Physical Exam  Constitutional: She appears well-developed and well-nourished.  Neck: Normal range of motion. Neck supple.  Cardiovascular: Normal rate, regular rhythm and normal heart sounds.   Respiratory: Effort normal and breath sounds normal. No respiratory distress. She has no wheezes. She has no rales. She exhibits no tenderness.  GI: Soft. Bowel sounds are normal. She exhibits no distension and no mass. There is no tenderness. There is no rebound and no guarding.       The patient is obese. Fundal height approximates term gestation.   NST shows baseline rate of 140s with moderate variability and accelerations present. There are no decelerations noted. There are no contractions that are picked up on tocometry.  Lab Results  Component Value Date   CREATININE 0.51 06/24/2011   BUN 11 06/24/2011   NA 134* 06/24/2011   K 3.8 06/24/2011  CL 103 06/24/2011   CO2 24 06/24/2011   Lab Results  Component Value Date   ALT 14 06/24/2011   AST 15 06/24/2011   ALKPHOS 97 06/24/2011   BILITOT 0.2* 06/24/2011   Lab Results  Component Value Date   WBC 9.9 06/24/2011   HGB 11.2* 06/24/2011   HCT 34.0* 06/24/2011   MCV 88.3 06/24/2011   PLT 254 06/24/2011   UA shows trace protein, no leukocytes, no nitrites.  MAU Course  Procedures    Assessment and Plan  #18  30 year old G4 P2 012 at 37 weeks and 1 day #2 chronic hypertension complicating pregnancy #3 Braxton Hicks contractions.  To the patient's elevated blood pressures, CMP with LFTs and a CBC were drawn which  were all normal. There is trace protein in the urine. The patient was encouraged to be consistent with taking her blood pressure medications. As the patient is not in labor the patient was discharged to home to followup with her prenatal care provider at her next scheduled appointment. NST was a category 1.  Sina Sumpter JEHIEL 06/24/2011, 10:48 PM

## 2011-06-24 NOTE — ED Notes (Signed)
In to give pt Aldomet and pt states she told MD incorrect time of last dose. Actually took it at 1700 rather that 1200. Dr Adrian Blackwater notified and will hold Aldomet for now.

## 2011-06-24 NOTE — Progress Notes (Signed)
G5P2 Ectopic 1 TAB1 Lower back and lower abd cramping all day.

## 2011-06-24 NOTE — Progress Notes (Signed)
Patient is here with c/o lower intermittent back and abdominal cramping and chronic leg swelling. She denies any headache, visual disturbance, epigastric pain, vaginal bleeding, lof or discharge. She reports good fetal movement. 2+ edema on left foot and 1+ on right foot. She states that she was 2cm this week by dr Despina Hidden.

## 2011-06-26 LAB — COMPREHENSIVE METABOLIC PANEL
ALT: 17
Albumin: 2.5 — ABNORMAL LOW
BUN: 6
Calcium: 9.3
Glucose, Bld: 80
Potassium: 4.1
Sodium: 137
Total Protein: 6

## 2011-06-26 LAB — CBC
Hemoglobin: 12.9
MCHC: 33.8
Platelets: 248
RDW: 14.5 — ABNORMAL HIGH

## 2011-06-26 LAB — LACTATE DEHYDROGENASE: LDH: 151

## 2011-06-26 LAB — URIC ACID: Uric Acid, Serum: 5.2

## 2011-06-28 LAB — URINALYSIS, ROUTINE W REFLEX MICROSCOPIC
Nitrite: NEGATIVE
Specific Gravity, Urine: >1.03 — ABNORMAL HIGH
Urobilinogen, UA: 0.2
pH: 6

## 2011-06-28 LAB — URINE MICROSCOPIC-ADD ON

## 2011-07-03 ENCOUNTER — Telehealth (HOSPITAL_COMMUNITY): Payer: Self-pay | Admitting: *Deleted

## 2011-07-03 ENCOUNTER — Encounter (HOSPITAL_COMMUNITY): Payer: Self-pay | Admitting: *Deleted

## 2011-07-03 NOTE — Telephone Encounter (Signed)
Preadmission screen  

## 2011-07-07 ENCOUNTER — Encounter (HOSPITAL_COMMUNITY): Payer: Self-pay | Admitting: Anesthesiology

## 2011-07-07 ENCOUNTER — Inpatient Hospital Stay (HOSPITAL_COMMUNITY)
Admission: RE | Admit: 2011-07-07 | Discharge: 2011-07-10 | DRG: 774 | Disposition: A | Discharge status: Home / Self Care | Payer: Medicaid Other | Source: Ambulatory Visit | Attending: Family Medicine | Admitting: Family Medicine

## 2011-07-07 ENCOUNTER — Encounter (HOSPITAL_COMMUNITY): Payer: Self-pay

## 2011-07-07 ENCOUNTER — Inpatient Hospital Stay (HOSPITAL_COMMUNITY): Payer: Medicaid Other

## 2011-07-07 ENCOUNTER — Inpatient Hospital Stay (HOSPITAL_COMMUNITY): Payer: Medicaid Other | Admitting: Anesthesiology

## 2011-07-07 DIAGNOSIS — O10419 Pre-existing secondary hypertension complicating pregnancy, unspecified trimester: Secondary | ICD-10-CM

## 2011-07-07 DIAGNOSIS — I151 Hypertension secondary to other renal disorders: Secondary | ICD-10-CM

## 2011-07-07 DIAGNOSIS — O1002 Pre-existing essential hypertension complicating childbirth: Principal | ICD-10-CM | POA: Diagnosis present

## 2011-07-07 DIAGNOSIS — E669 Obesity, unspecified: Secondary | ICD-10-CM | POA: Diagnosis present

## 2011-07-07 LAB — CBC
HCT: 35.2 % — ABNORMAL LOW (ref 36.0–46.0)
Hemoglobin: 11.4 g/dL — ABNORMAL LOW (ref 12.0–15.0)
Hemoglobin: 12 g/dL (ref 12.0–15.0)
MCHC: 32.4 g/dL (ref 30.0–36.0)
MCHC: 33 g/dL (ref 30.0–36.0)
Platelets: 231 10*3/uL (ref 150–400)
RDW: 15.7 % — ABNORMAL HIGH (ref 11.5–15.5)

## 2011-07-07 LAB — COMPREHENSIVE METABOLIC PANEL
ALT: 16 U/L (ref 0–35)
Alkaline Phosphatase: 110 U/L (ref 39–117)
CO2: 22 mEq/L (ref 19–32)
Calcium: 9.3 mg/dL (ref 8.4–10.5)
Chloride: 104 mEq/L (ref 96–112)
GFR calc Af Amer: >90 mL/min (ref >90)
GFR calc non Af Amer: >90 mL/min (ref >90)
Glucose, Bld: 66 mg/dL — ABNORMAL LOW (ref 70–99)
Sodium: 136 mEq/L (ref 135–145)
Total Bilirubin: 0.3 mg/dL (ref 0.3–1.2)

## 2011-07-07 LAB — PROTEIN / CREATININE RATIO, URINE: Protein Creatinine Ratio: 0.49 — ABNORMAL HIGH (ref 0.00–0.15)

## 2011-07-07 MED ORDER — PHENYLEPHRINE 40 MCG/ML (10ML) SYRINGE FOR IV PUSH (FOR BLOOD PRESSURE SUPPORT)
80.0000 ug | PREFILLED_SYRINGE | INTRAVENOUS | Status: DC | PRN
  Filled 2011-07-07: qty 5

## 2011-07-07 MED ORDER — METHYLDOPA 500 MG PO TABS
500.0000 mg | ORAL_TABLET | Freq: Two times a day (BID) | ORAL | Status: DC
  Administered 2011-07-07 (×2): 500 mg via ORAL
  Filled 2011-07-07 (×3): qty 1

## 2011-07-07 MED ORDER — IBUPROFEN 600 MG PO TABS: 600.0000 mg | ORAL_TABLET | Freq: Four times a day (QID) | ORAL | Status: DC | PRN

## 2011-07-07 MED ORDER — LACTATED RINGERS IV SOLN
INTRAVENOUS | Status: DC
  Administered 2011-07-07 – 2011-07-08 (×4): via INTRAVENOUS

## 2011-07-07 MED ORDER — OXYTOCIN 20 UNITS IN LACTATED RINGERS INFUSION - SIMPLE
1.0000 m[IU]/min | INTRAVENOUS | Status: DC
  Administered 2011-07-07: 2 m[IU]/min via INTRAVENOUS
  Administered 2011-07-08: 8 m[IU]/min via INTRAVENOUS
  Filled 2011-07-07: qty 1000

## 2011-07-07 MED ORDER — ACETAMINOPHEN 325 MG PO TABS: 650.0000 mg | ORAL_TABLET | ORAL | Status: DC | PRN

## 2011-07-07 MED ORDER — TERBUTALINE SULFATE 1 MG/ML IJ SOLN: 0.2500 mg | Freq: Once | INTRAMUSCULAR | Status: AC | PRN

## 2011-07-07 MED ORDER — EPHEDRINE 5 MG/ML INJ
10.0000 mg | INTRAVENOUS | Status: DC | PRN
  Filled 2011-07-07 (×2): qty 4

## 2011-07-07 MED ORDER — CITRIC ACID-SODIUM CITRATE 334-500 MG/5ML PO SOLN: 30.0000 mL | ORAL | Status: DC | PRN

## 2011-07-07 MED ORDER — DIPHENHYDRAMINE HCL 50 MG/ML IJ SOLN: 12.5000 mg | INTRAMUSCULAR | Status: DC | PRN

## 2011-07-07 MED ORDER — LACTATED RINGERS IV SOLN: 500.0000 mL | INTRAVENOUS | Status: DC | PRN

## 2011-07-07 MED ORDER — NALBUPHINE SYRINGE 5 MG/0.5 ML
5.0000 mg | INJECTION | INTRAMUSCULAR | Status: DC | PRN
Start: 2011-07-07 — End: 2011-07-08
  Administered 2011-07-07: 5 mg via INTRAVENOUS
  Filled 2011-07-07: qty 0.5

## 2011-07-07 MED ORDER — LIDOCAINE HCL (PF) 1 % IJ SOLN
30.0000 mL | INTRAMUSCULAR | Status: DC | PRN
  Filled 2011-07-07 (×2): qty 30

## 2011-07-07 MED ORDER — OXYTOCIN BOLUS FROM INFUSION
500.0000 mL | Freq: Once | INTRAVENOUS | Status: DC
  Filled 2011-07-07 (×2): qty 1000
  Filled 2011-07-07: qty 500

## 2011-07-07 MED ORDER — LACTATED RINGERS IV SOLN: 500.0000 mL | Freq: Once | INTRAVENOUS | Status: DC

## 2011-07-07 MED ORDER — PHENYLEPHRINE 40 MCG/ML (10ML) SYRINGE FOR IV PUSH (FOR BLOOD PRESSURE SUPPORT)
80.0000 ug | PREFILLED_SYRINGE | INTRAVENOUS | Status: DC | PRN
  Filled 2011-07-07 (×2): qty 5

## 2011-07-07 MED ORDER — OXYTOCIN 20 UNITS IN LACTATED RINGERS INFUSION - SIMPLE: 125.0000 mL/h | Freq: Once | INTRAVENOUS | Status: DC

## 2011-07-07 MED ORDER — FLEET ENEMA 7-19 GM/118ML RE ENEM: 1.0000 | ENEMA | RECTAL | Status: DC | PRN

## 2011-07-07 MED ORDER — FENTANYL 2.5 MCG/ML BUPIVACAINE 1/10 % EPIDURAL INFUSION (WH - ANES)
14.0000 mL/h | INTRAMUSCULAR | Status: DC
  Administered 2011-07-07 – 2011-07-08 (×4): 14 mL/h via EPIDURAL
  Filled 2011-07-07 (×5): qty 60

## 2011-07-07 MED ORDER — FENTANYL 2.5 MCG/ML BUPIVACAINE 1/10 % EPIDURAL INFUSION (WH - ANES)
INTRAMUSCULAR | Status: DC | PRN
  Administered 2011-07-07: 14 mL/h via EPIDURAL

## 2011-07-07 MED ORDER — NALBUPHINE SYRINGE 5 MG/0.5 ML
INJECTION | INTRAMUSCULAR | Status: AC
  Filled 2011-07-07: qty 0.5

## 2011-07-07 MED ORDER — ONDANSETRON HCL 4 MG/2ML IJ SOLN: 4.0000 mg | Freq: Four times a day (QID) | INTRAMUSCULAR | Status: DC | PRN

## 2011-07-07 MED ORDER — EPHEDRINE 5 MG/ML INJ
10.0000 mg | INTRAVENOUS | Status: DC | PRN
  Filled 2011-07-07: qty 4

## 2011-07-07 MED ORDER — OXYCODONE-ACETAMINOPHEN 5-325 MG PO TABS: 2.0000 | ORAL_TABLET | ORAL | Status: DC | PRN

## 2011-07-07 MED ORDER — MISOPROSTOL 25 MCG QUARTER TABLET
25.0000 ug | ORAL_TABLET | ORAL | Status: DC | PRN
  Administered 2011-07-07: 25 ug via VAGINAL
  Filled 2011-07-07: qty 0.25
  Filled 2011-07-07: qty 1

## 2011-07-07 MED ORDER — LIDOCAINE-EPINEPHRINE (PF) 2 %-1:200000 IJ SOLN
INTRAMUSCULAR | Status: DC | PRN
  Administered 2011-07-07: 5 mL via EPIDURAL

## 2011-07-07 NOTE — Anesthesia Preprocedure Evaluation (Signed)
Anesthesia Evaluation  Patient identified by MRN, date of birth, ID band Patient awake  General Assessment Comment  Reviewed: Allergy & Precautions, H&P , Patient's Chart, lab work & pertinent test results  Airway Mallampati: III TM Distance: >3 FB Neck ROM: full    Dental  (+) Teeth Intact   Pulmonary  clear to auscultation        Cardiovascular hypertension (well controlled), On Medications regular Normal    Neuro/Psych    GI/Hepatic   Endo/Other  Morbid obesity  Renal/GU      Musculoskeletal   Abdominal   Peds  Hematology   Anesthesia Other Findings       Reproductive/Obstetrics (+) Pregnancy                           Anesthesia Physical Anesthesia Plan  ASA: III  Anesthesia Plan: Epidural   Post-op Pain Management:    Induction:   Airway Management Planned:   Additional Equipment:   Intra-op Plan:   Post-operative Plan:   Informed Consent: I have reviewed the patients History and Physical, chart, labs and discussed the procedure including the risks, benefits and alternatives for the proposed anesthesia with the patient or authorized representative who has indicated his/her understanding and acceptance.   Dental Advisory Given  Plan Discussed with:   Anesthesia Plan Comments: (Labs checked- platelets confirmed with RN in room. Fetal heart tracing, per RN, reported to be stable enough for sitting procedure. Discussed epidural, and patient consents to the procedure:  included risk of possible headache,backache, failed block, allergic reaction, and nerve injury. This patient was asked if she had any questions or concerns before the procedure started. )        Anesthesia Quick Evaluation

## 2011-07-07 NOTE — Progress Notes (Signed)
[name] is a 30 y.o. [id number] at [redacted]w[redacted]d admitted for induction of labor due to Hypertension.  Subjective: Patient has had one dose of cytotec, and reports some cramping/contractions.   Objective: BP 142/93  Pulse 85  Temp(Src) 98.8 F (37.1 C) (Oral)  Resp 20  Ht 5\' 5"  (1.651 m)  Wt 336 lb (152.409 kg)  BMI 55.91 kg/m2      FHT:  FHR: 140s bpm, variability: moderate,  accelerations:  Present,  decelerations:  Absent UC:   irregular, every 5-10 minutes SVE:   Dilation: 1.5 Effacement (%): 50 Station: -3 Exam by:: Dr. Natale Milch (Foley Bulb placed)  Labs: Lab Results  Component Value Date   WBC 10.0 07/07/2011   HGB 11.4* 07/07/2011   HCT 35.2* 07/07/2011   MCV 88.9 07/07/2011   PLT 240 07/07/2011    Assessment / Plan: IOL for CHTN. Foley placed, will start low dose pitocin. Will write for some IV pain meds. Fetal Wellbeing:  Category I Pain Control:  will start nubain I/D:  n/a Anticipated MOD:  NSVD  [name] N 07/07/2011, 4:03 PM

## 2011-07-07 NOTE — H&P (Signed)
Patient was seen and evaluated with PA-S Armond Hang, agree with documentation.

## 2011-07-07 NOTE — Progress Notes (Signed)
[name] is a 30 y.o. [id number] at [redacted]w[redacted]d by admitted for induction of labor due to Hypertension.  Subjective: No pain, no LOF, no pelvic pressure   Objective: BP 125/79  Pulse 96  Temp(Src) 97.9 F (36.6 C) (Oral)  Resp 20  Ht 5\' 5"  (1.651 m)  Wt 152.409 kg (336 lb)  BMI 55.91 kg/m2      FHT:  FHR: 130 bpm, variability: moderate,  accelerations:  Present,  decelerations:  Absent UC:   irregular, every 5 minutes SVE:   Dilation: 4 Effacement (%): 50 Station: -3 Exam by:: [employer] [facility]: Lab Results  Component Value Date   WBC 9.2 07/07/2011   HGB 12.0 07/07/2011   HCT 36.4 07/07/2011   MCV 89.0 07/07/2011   PLT 231 07/07/2011    Assessment / Plan:  Labor: inadequate contractions, titrate pitocin Fetal Wellbeing:  Category I Pain Control:  Epidural Anticipated MOD:  NSVD  Kori Goins 07/07/2011, 10:36 PM

## 2011-07-07 NOTE — Progress Notes (Signed)
Avalynne A Geerdes is a 30 y.o. [id number] at [redacted]w[redacted]d admitted for induction of labor due to Hypertension.  Subjective:   Objective: BP 139/76  Pulse 81  Temp(Src) 99.1 F (37.3 C) (Oral)  Resp 20  Ht 5\' 5"  (1.651 m)  Wt 336 lb (152.409 kg)  BMI 55.91 kg/m2      FHT:  FHR: 135 bpm, variability: moderate,  accelerations:  Present,  decelerations:  Absent UC:   regular, every 1-3 minutes SVE:   Dilation: 4 Effacement (%): 50 Station: -3 Exam by:: Foley bulb out; Dr Natale Milch  Labs: Lab Results  Component Value Date   WBC 10.0 07/07/2011   HGB 11.4* 07/07/2011   HCT 35.2* 07/07/2011   MCV 88.9 07/07/2011   PLT 240 07/07/2011    Assessment / Plan: Induction of labor due to Chronic HTN,  progressing well on pitocin, at 4 mU/min; foley bulb fell out Fetal Wellbeing:  Category I Pain Control:  nubain, asking for epidural I/D:  n/a Anticipated MOD:  NSVD  Thetis Schwimmer N 07/07/2011, 6:13 PM

## 2011-07-07 NOTE — H&P (Signed)
[name] is a 30 y.o. female presenting for induction of labor due to chronic hypertension in the present and pre-eclampsia in the previous pregnancy.  Her blood pressure during this pregnancy were better controlled. Pedal edema present since the 5th-6th month of pregnancy. She does not report of any headaches, vision disturbances, epigastric pain, no frank bleeding PV or passing of clots, no discharge PV, no chest pain or palpitation, no nausea or vomiting, no significant diarrhea or constipation.  During this pregnancy, she was placed on Vatrex for h/o Herpes infection, methyldopa for hypertension during pregnancy and pre-natal vitamins. She indicated the desire to have Epidural during delivery, cord blood donor, would like to breast feed baby using breast pump and is considering other methods of contraception given that OCPs were not recommended for her.   Maternal Medical History:  Reason for admission: Reason for Admission:   nausea Induction of labor - chronic hypertension in the present and pre-eclampsia previous pregnancy  Fetal activity: Perceived fetal activity is normal.   Last perceived fetal movement was within the past hour.    Prenatal complications: Hypertension.   No bleeding, cholelithiasis, infection, IUGR, nephrolithiasis, oligohydramnios, placental abnormality, pre-eclampsia, preterm labor, substance abuse, thrombocytopenia or thrombophilia.   Prenatal Complications - Diabetes: none.   She was detected to have pre-eclampsia towards the end of her previous pregnancy and continued to have hypertension following her delivery. She continued to have higher blood pressures but controlled on medications during this pregnancy. OB History    Grav Para Term Preterm Abortions TAB SAB Ect Mult Living   5 2 1 1 2 1  1  2     1st: 1996, Full term NSVD, no complications 2nd: 2001elective termination of pregnancy 3rd: 2002-03 tubal pregnancy, terminated during 1st month 4th 2008  induced at 38 weeks, pre-eclampsia NVD  Past Medical History  Diagnosis Date   Hypertension     chtn   PIH (pregnancy induced hypertension)    Pregnancy induced hypertension     history   Past Surgical History  Procedure Date   Dilation and curettage of uterus   Gal bladder removal 2009 Gastric ulcers, endoscopy 2009 D&C 2008  Family History: family history includes Cancer in her maternal grandmother; Diabetes in her maternal grandmother; and Heart disease in her paternal grandfather and paternal grandmother. Social History:  reports that she has never smoked. She does not have any smokeless tobacco history on file. She reports that she does not drink alcohol or use illicit drugs. Patient stated that she smoked prior to pregnancy.  Review of Systems  Constitutional: Negative.   HENT: Negative for hearing loss, nosebleeds, congestion and ear discharge.   Eyes: Negative for blurred vision, pain and discharge.  Respiratory: Negative.   Cardiovascular: Positive for leg swelling. Negative for chest pain and palpitations.  Gastrointestinal: Negative for nausea, vomiting, abdominal pain, diarrhea and constipation.  Genitourinary: Negative for dysuria and flank pain.  Musculoskeletal: Negative for myalgias.  Skin: Negative.   Neurological: Negative for dizziness, tingling, focal weakness, seizures, loss of consciousness and headaches.  Endo/Heme/Allergies: Negative for polydipsia. Does not bruise/bleed easily.  Psychiatric/Behavioral: Negative for depression, suicidal ideas and substance abuse.    Dilation: 1.5 Effacement (%): 50 Station: -3 Exam by:: Clearence Cheek RN Blood pressure 141/82, pulse 82, temperature 97.6 F (36.4 C), temperature source Oral, resp. rate 20, height 5\' 5"  (1.651 m), weight 152.409 kg (336 lb). Maternal Exam:  Abdomen: Surgical scars: laparoscopic scar.   Fetal presentation: vertex Past h/o surgery  for tubal pregnancy in 2002-03, gall bladder removal  2009 with abdominal scars to correspond to the history  Introitus: Normal vulva. Normal vagina.  Vagina is negative for discharge.  Ferning test: not done.  Nitrazine test: not done.  Pelvis: adequate for delivery.   Cervix: Cervix evaluated by digital exam.   Was difficult to assess, 1.5 to 2 cm dilatation  Fetal Exam Fetal Monitor Review: Mode: ultrasound.   Variability: minimal (<5 bpm).   Pattern: accelerations present.   Fetal presenting part was difficult to examine by bedside US, possibly due to increased body mass around the belly. [place] was ordered from the Radiology department  Fetal State Assessment: Category I - tracings are normal.     Physical Exam  Constitutional: She is oriented to person, place, and time. She appears well-developed and well-nourished. No distress.       Obesity  HENT:  Head: Normocephalic and atraumatic.  Nose: Nose normal.  Mouth/Throat: Oropharynx is clear and moist. No oropharyngeal exudate.  Eyes: Conjunctivae and EOM are normal. Pupils are equal, round, and reactive to light. Right eye exhibits no discharge. Left eye exhibits no discharge. No scleral icterus.  Neck: Normal range of motion.  Cardiovascular: Normal rate, regular rhythm and intact distal pulses.   Respiratory: Effort normal and breath sounds normal.  GI: Soft. Bowel sounds are normal.  Genitourinary: Vagina normal and uterus normal. No vaginal discharge found.       Cervix was difficult to assess, 1.5cm dilatation, 50% effacement and think, long. Presenting part was difficult to palpate, bedside US was inconclusive to assess presenting part and thus formal US from the radiology department was ordered  Musculoskeletal: Normal range of motion. She exhibits edema.       Pedal edema (since 5th month of pregnancy)  Lymphadenopathy:    She has no cervical adenopathy.  Neurological: She is alert and oriented to person, place, and time.  Skin: Skin is warm and dry.    Prenatal  labs: ABO, Rh:   A+ Antibody:   Rubella: Immune (03/12 0000) RPR: Nonreactive (03/12 0000)  HBsAg: Negative (03/12 0000)  HIV: Non-reactive (03/12 0000)  GBS: Negative (10/11 0000)   Assessment/Plan: 1. Presenting part uncertainty: [place] report showed that it was a vertex, Induction protocol was started, Cytotec prescribed and administer. 2. Preeclampsia: Urine protein/creatinine ration, CMP ordered to monitor preeclampsia 3. Monitor the progress of labor    Chetan Kapat 07/07/2011, 1:50 PM

## 2011-07-07 NOTE — Progress Notes (Signed)
[name] is a 30 y.o. [id number] at [redacted]w[redacted]d  admitted for induction of labor due to Hypertension.  Subjective: Patient denies pain x/p epidural; unsure if ROM  Objective: BP 126/72  Pulse 100  Temp(Src) 97.9 F (36.6 C) (Oral)  Resp 20  Ht 5\' 5"  (1.651 m)  Wt 152.409 kg (336 lb)  BMI 55.91 kg/m2      FHT:  FHR: 125 bpm, variability: moderate,  accelerations:  Present,  decelerations:  Absent UC:   regular, every 3 minutes SVE:   Dilation: 4 Effacement (%): 50 Station: -3 Exam by:: Foley bulb out; Dr Natale Milch  Labs: Lab Results  Component Value Date   WBC 9.2 07/07/2011   HGB 12.0 07/07/2011   HCT 36.4 07/07/2011   MCV 89.0 07/07/2011   PLT 231 07/07/2011    Assessment / Plan: Induction of labor due to gestational hypertension,  progressing well on pitocin  Labor: titrate pitocin  Fetal Wellbeing:  Category I Pain Control:  Epidural Anticipated MOD:  NSVD  [name] 07/07/2011, 8:39 PM

## 2011-07-07 NOTE — Anesthesia Procedure Notes (Signed)
Epidural Patient location during procedure: OB  Preanesthetic Checklist Completed: patient identified, site marked, surgical consent, pre-op evaluation, timeout performed, IV checked, risks and benefits discussed and monitors and equipment checked  Epidural Patient position: sitting Prep: site prepped and draped and DuraPrep Patient monitoring: continuous pulse ox and blood pressure Approach: midline Injection technique: LOR air  Needle:  Needle type: Tuohy  Needle gauge: 17 G Needle length: 9 cm Needle insertion depth: 7 cm Catheter type: closed end flexible Catheter size: 19 Gauge Catheter at skin depth: 14 cm Test dose: negative  Assessment Events: blood not aspirated, injection not painful, no injection resistance, negative IV test and no paresthesia  Additional Notes Dosing of Epidural:  1st dose, through needle ............................................. epi 1:200K + Xylocaine 40 mg  2nd dose, through catheter, after waiting 3 minutes.....epi 1:200K + Xylocaine 60 mg  3rd dose, through catheter after waiting 3 minutes .............................Marcaine   5mg   ( mg Marcaine are expressed as equivilent  cc's medication removed from the 0.1%Bupiv / fentanyl syringe from L&D pump)  ( 2% Xylo charted as a single dose in Epic Meds for ease of charting; actual dosing was fractionated as above, for saftey's sake)  As each dose occurred, patient was free of IV sx; and patient exhibited no evidence of SA injection.  Patient is more comfortable after epidural dosed. Please see RN's note for documentation of vital signs,and FHR which are stable.       

## 2011-07-08 ENCOUNTER — Encounter (HOSPITAL_COMMUNITY): Payer: Self-pay

## 2011-07-08 DIAGNOSIS — IMO0002 Reserved for concepts with insufficient information to code with codable children: Secondary | ICD-10-CM

## 2011-07-08 LAB — CBC
MCV: 88.5 fL (ref 78.0–100.0)
Platelets: 238 10*3/uL (ref 150–400)
RBC: 3.57 MIL/uL — ABNORMAL LOW (ref 3.87–5.11)
RDW: 15.6 % — ABNORMAL HIGH (ref 11.5–15.5)
WBC: 25.4 10*3/uL — ABNORMAL HIGH (ref 4.0–10.5)

## 2011-07-08 LAB — MRSA PCR SCREENING: MRSA by PCR: NEGATIVE

## 2011-07-08 MED ORDER — WITCH HAZEL-GLYCERIN EX PADS: 1.0000 "application " | MEDICATED_PAD | CUTANEOUS | Status: DC | PRN

## 2011-07-08 MED ORDER — LACTATED RINGERS IV SOLN
INTRAVENOUS | Status: DC
  Administered 2011-07-09: 02:00:00 via INTRAVENOUS

## 2011-07-08 MED ORDER — SIMETHICONE 80 MG PO CHEW: 80.0000 mg | CHEWABLE_TABLET | ORAL | Status: DC | PRN

## 2011-07-08 MED ORDER — METHYLDOPA 500 MG PO TABS
500.0000 mg | ORAL_TABLET | Freq: Two times a day (BID) | ORAL | Status: DC
  Filled 2011-07-08: qty 1

## 2011-07-08 MED ORDER — MAGNESIUM SULFATE 40 G IN LACTATED RINGERS - SIMPLE
2.0000 g/h | INTRAVENOUS | Status: DC
  Administered 2011-07-08: 2 g/h via INTRAVENOUS
  Filled 2011-07-08 (×2): qty 500

## 2011-07-08 MED ORDER — BENZOCAINE-MENTHOL 20-0.5 % EX AERO
1.0000 "application " | INHALATION_SPRAY | CUTANEOUS | Status: DC | PRN
  Administered 2011-07-08: 1 via TOPICAL

## 2011-07-08 MED ORDER — DIBUCAINE 1 % RE OINT: 1.0000 "application " | TOPICAL_OINTMENT | RECTAL | Status: DC | PRN

## 2011-07-08 MED ORDER — MISOPROSTOL 200 MCG PO TABS
ORAL_TABLET | ORAL | Status: AC
  Administered 2011-07-08: 1000 ug via VAGINAL
  Filled 2011-07-08: qty 2

## 2011-07-08 MED ORDER — MISOPROSTOL 200 MCG PO TABS
ORAL_TABLET | ORAL | Status: AC
  Filled 2011-07-08: qty 3

## 2011-07-08 MED ORDER — ONDANSETRON HCL 4 MG/2ML IJ SOLN: 4.0000 mg | INTRAMUSCULAR | Status: DC | PRN

## 2011-07-08 MED ORDER — PRENATAL PLUS 27-1 MG PO TABS
1.0000 | ORAL_TABLET | Freq: Every day | ORAL | Status: DC
  Filled 2011-07-08: qty 1

## 2011-07-08 MED ORDER — IBUPROFEN 600 MG PO TABS
600.0000 mg | ORAL_TABLET | Freq: Four times a day (QID) | ORAL | Status: DC
  Administered 2011-07-08 – 2011-07-10 (×7): 600 mg via ORAL
  Filled 2011-07-08 (×7): qty 1

## 2011-07-08 MED ORDER — ONDANSETRON HCL 4 MG PO TABS: 4.0000 mg | ORAL_TABLET | ORAL | Status: DC | PRN

## 2011-07-08 MED ORDER — SENNOSIDES-DOCUSATE SODIUM 8.6-50 MG PO TABS
2.0000 | ORAL_TABLET | Freq: Every day | ORAL | Status: DC
  Administered 2011-07-08 – 2011-07-09 (×2): 2 via ORAL

## 2011-07-08 MED ORDER — OXYCODONE-ACETAMINOPHEN 5-325 MG PO TABS
1.0000 | ORAL_TABLET | ORAL | Status: DC | PRN
  Administered 2011-07-08: 2 via ORAL
  Administered 2011-07-08 – 2011-07-09 (×3): 1 via ORAL
  Administered 2011-07-09 (×3): 2 via ORAL
  Filled 2011-07-08 (×2): qty 1
  Filled 2011-07-08: qty 2
  Filled 2011-07-08: qty 1
  Filled 2011-07-08 (×3): qty 2

## 2011-07-08 MED ORDER — TETANUS-DIPHTH-ACELL PERTUSSIS 5-2.5-18.5 LF-MCG/0.5 IM SUSP
0.5000 mL | Freq: Once | INTRAMUSCULAR | Status: AC
  Administered 2011-07-09: 0.5 mL via INTRAMUSCULAR
  Filled 2011-07-08 (×2): qty 0.5

## 2011-07-08 MED ORDER — DIPHENHYDRAMINE HCL 25 MG PO CAPS: 25.0000 mg | ORAL_CAPSULE | Freq: Four times a day (QID) | ORAL | Status: DC | PRN

## 2011-07-08 MED ORDER — ZOLPIDEM TARTRATE 5 MG PO TABS: 5.0000 mg | ORAL_TABLET | Freq: Every evening | ORAL | Status: DC | PRN

## 2011-07-08 MED ORDER — LANOLIN HYDROUS EX OINT: TOPICAL_OINTMENT | CUTANEOUS | Status: DC | PRN

## 2011-07-08 MED ORDER — MAGNESIUM SULFATE BOLUS VIA INFUSION
4.0000 g | Freq: Once | INTRAVENOUS | Status: AC
  Administered 2011-07-08: 4 g via INTRAVENOUS
  Filled 2011-07-08: qty 500

## 2011-07-08 MED ORDER — BENZOCAINE-MENTHOL 20-0.5 % EX AERO
INHALATION_SPRAY | CUTANEOUS | Status: AC
  Administered 2011-07-08: 1 via TOPICAL
  Filled 2011-07-08: qty 56

## 2011-07-08 NOTE — Progress Notes (Signed)
Minimal urine output noted in foley catheter. Bladder scan revealed ~100cc urine.  Foley manipulated, leg strap applied and pt. repositioned. After ., still no urine noted in foley tubing.  Marlynn Perking, CNM ordered foley to be removed and allow pt to have a trial of voiding.  Pt verbalized understanding of POC.  Tia Alert A

## 2011-07-08 NOTE — Progress Notes (Signed)
Pt. Admitted to AICU via W/C s/p vaginal delivery with PIH.  VSS upon arrival, and both pt. And husband oriented to room and current POC. Tia Alert A

## 2011-07-08 NOTE — Progress Notes (Signed)
[name] is a 30 y.o. [id number] at [redacted]w[redacted]d admitted for induction of labor due to Hypertension.  Objective: BP 123/72  Pulse 89  Temp(Src) 98 F (36.7 C) (Oral)  Resp 20  Ht 5\' 5"  (1.651 m)  Wt 336 lb (152.409 kg)  BMI 55.91 kg/m2   FHT:  FHR: 135-140 bpm, variability: moderate,  accelerations:  Present,  decelerations:  Absent UC:   Hard to see on tracing SVE:   Dilation: 4 Effacement (%): 50 Station: -3 Exam by:: [employer]: Lab Results  Component Value Date   WBC 9.2 07/07/2011   HGB 12.0 07/07/2011   HCT 36.4 07/07/2011   MCV 89.0 07/07/2011   PLT 231 07/07/2011    Assessment / Plan: Induction of labor due to chronic HTN,  progressing well on pitocin, at 10 mU/min AROM, clear with bloody show. IUPC and FSE placed for better monitoring (secondary to morbid obesity of this patient). Fetal Wellbeing:  Category I Pain Control:  Epidural I/D:  Gutierrez/a Anticipated MOD:  NSVD  [name] 07/08/2011, 1:42 AM

## 2011-07-08 NOTE — Progress Notes (Signed)
Noted that patient had protein/creatine ratio of 0.49; will start magnesium sulfate IV.

## 2011-07-08 NOTE — Progress Notes (Signed)
[name] is a 30 y.o. [id number] at [redacted]w[redacted]d by admitted for induction of labor due to Hypertension.  Subjective: Patient without pain or discomfort, no changed since Magnesium started, no abdominal pain or SOB   Objective: BP 126/89  Pulse 84  Temp(Src) 98.2 F (36.8 C) (Oral)  Resp 22  Ht 5\' 5"  (1.651 m)  Wt 152.409 kg (336 lb)  BMI 55.91 kg/m2   Total I/O In: 2627.2 [P.O.:240; I.V.:2387.2] Out: 750 [Urine:750]  FHT:  FHR: 135 bpm, variability: moderate,  accelerations:  Present,  decelerations:  Present early x 1 UC:   regular, every 2-3 minutes, > 200 MVU's  SVE:   Dilation: 5 Effacement (%): 50 Station: -3 Exam by:: [employer]: Lab Results  Component Value Date   WBC 9.2 07/07/2011   HGB 12.0 07/07/2011   HCT 36.4 07/07/2011   MCV 89.0 07/07/2011   PLT 231 07/07/2011    Assessment / Plan: Induction of labor due to gestational hypertension,  progressing well on pitocin s/p AROM   Preeclampsia:  on magnesium sulfate, no signs or symptoms of toxicity and intake and ouput balanced Fetal Wellbeing:  Category I Pain Control:  Epidural I/D:  n/a Anticipated MOD:  NSVD  [name] 07/08/2011, 3:55 AM

## 2011-07-08 NOTE — Progress Notes (Addendum)
[name] is a 30 y.o. [id number] at [redacted]w[redacted]d by admitted for induction of labor due to Hypertension.  Subjective: Patient without complaints  Objective: BP 133/66  Pulse 104  Temp(Src) 98 F (36.7 C) (Oral)  Resp 20  Ht 5\' 5"  (1.651 m)  Wt 152.409 kg (336 lb)  BMI 55.91 kg/m2      FHT:  FHR: 140 bpm, variability: moderate,  accelerations:  Present,  decelerations:  Absent UC:   regular, every 3 minutes SVE:   Dilation: 4 Effacement (%): 50 Station: -3 Exam by:: [facility]: Lab Results  Component Value Date   WBC 9.2 07/07/2011   HGB 12.0 07/07/2011   HCT 36.4 07/07/2011   MCV 89.0 07/07/2011   PLT 231 07/07/2011    Assessment / Plan:  Labor: minimal progression on pit, continue to titrate (Currently @ 8), membranes intact - AROM when possible  Preeclampsia: no evidence  Fetal Wellbeing:  Category I Pain Control:  Epidural I/D:  n/a Anticipated MOD:  NSVD  Fredia Chittenden 07/08/2011, 12:48 AM

## 2011-07-08 NOTE — Progress Notes (Addendum)
[name] is a 30 y.o. [id number] at [redacted]w[redacted]d by admitted for induction of labor due to Hypertension and pre-eclampsia.  Subjective: Feels increased pelvic pressure, denies shortness of breat, + nausea   Objective: BP 127/61  Pulse 101  Temp(Src) 98 F (36.7 C) (Oral)  Resp 20  Ht 5\' 5"  (1.651 m)  Wt 152.409 kg (336 lb)  BMI 55.91 kg/m2   Total I/O In: 2937.2 [P.O.:340; I.V.:2597.2] Out: 950 [Urine:950]  FHT:  FHR: 135 bpm, variability: marked,  accelerations:  Present,  decelerations:  Present  early  UC:   regular, every 3 minutes SVE:   Dilation: 8 Effacement (%): 90 Station: 0 Exam by:: [facility]: Lab Results  Component Value Date   WBC 9.2 07/07/2011   HGB 12.0 07/07/2011   HCT 36.4 07/07/2011   MCV 89.0 07/07/2011   PLT 231 07/07/2011    Assessment / Plan: Induction of labor due to preeclampsia,  progressing well on pitocin  Labor: continue pitocin  Preeclampsia:  on magnesium sulfate and no signs or symptoms of toxicity Fetal Wellbeing:  Category I Pain Control:  Epidural I/D:  n/a Anticipated MOD:  NSVD  [name] 07/08/2011, 5:55 AM

## 2011-07-09 LAB — CBC
Hemoglobin: 9.3 g/dL — ABNORMAL LOW (ref 12.0–15.0)
MCH: 29.3 pg (ref 26.0–34.0)
Platelets: 238 10*3/uL (ref 150–400)
RBC: 3.17 MIL/uL — ABNORMAL LOW (ref 3.87–5.11)
WBC: 18.2 10*3/uL — ABNORMAL HIGH (ref 4.0–10.5)

## 2011-07-09 MED ORDER — INFLUENZA VIRUS VACC SPLIT PF IM SUSP
0.5000 mL | Freq: Once | INTRAMUSCULAR | Status: AC
  Administered 2011-07-09: 0.5 mL via INTRAMUSCULAR
  Filled 2011-07-09: qty 0.5

## 2011-07-09 NOTE — Progress Notes (Signed)
Post Partum Day 1 Subjective: no complaints, up ad lib, voiding, tolerating PO, + flatus and No headache, scotomata, RUQ pain  Objective: Blood pressure 129/83, pulse 83, temperature 98 F (36.7 C), temperature source Oral, resp. rate 18, height 5\' 5"  (1.651 m), weight 148.734 kg (327 lb 14.4 oz), SpO2 94.00%, unknown if currently breastfeeding.  Physical Exam:  General: alert, cooperative and no distress Lochia: appropriate Uterine Fundus: firm Incision: n/a DVT Evaluation: No evidence of DVT seen on physical exam.   Basename 07/09/11 0515 07/08/11 1045  HGB 9.3* 10.6*  HCT 28.0* 31.6*    Assessment/Plan: Plan for discharge tomorrow, Breastfeeding and Contraception progesterone only pill Transfer to floor. D/c magnesium sulfate No aldomet necessary at this time.  BPs WNL.   LOS: 2 days   Adali Pennings H. 07/09/2011, 9:34 AM   `

## 2011-07-09 NOTE — Progress Notes (Signed)
Transferred pt. To [facility] via W/C. Stable upon arrival. Tia Alert A

## 2011-07-09 NOTE — Anesthesia Postprocedure Evaluation (Signed)
  Anesthesia Post-op Note  Patient: [name]  Procedure(s) Performed: * No procedures listed *  Patient Location: A-ICU  Anesthesia Type: Epidural  Level of Consciousness: awake, alert  and oriented  Airway and Oxygen Therapy: Patient Spontanous Breathing  Post-op Pain: mild  Post-op Assessment: Patient's Cardiovascular Status Stable and Respiratory Function Stable  Post-op Vital Signs: stable  Complications: No apparent anesthesia complications

## 2011-07-10 NOTE — Discharge Summary (Signed)
Obstetric Discharge Summary Reason for Admission: induction of labor Prenatal Procedures: none Intrapartum Procedures: spontaneous vaginal delivery Postpartum Procedures: none Complications-Operative and Postpartum: 2nd degree perineal laceration and hemorrhage of about 1200 cc, Laceration sutured with 3.0 vicryl. PPH treated with Cytotec Hemoglobin  Date Value Range Status  07/09/2011 9.3* 12.0-15.0 (g/dL) Final     HCT  Date Value Range Status  07/09/2011 28.0* 36.0-46.0 (%) Final   Hospital Course:  Pt presented for induction of labor due to Hypertension during pregnancy and h/o Pre-eclampsia in the previous pregnancy. She was induced with Pitocin and AROM. Her protein/ creatine ratio = 0.49 and she was started on Magnesium sulphate. She progressed through her labor without any s/s of toxicity. During her delivery, baby delivered with an OP presentation. The baby had poor tone, respiratory effort and color upon delivery, so she was immediately transferred NICU. A Code Apgar was called.  The patient had post-partum hemorrhage immediately after delivery of the placenta which was treated with intrauterine pressure and fundal massage. 1000 mcg of Cytotec was placed per rectum. The hemorrhage slowed significantly after the delivery of Cytotec (patient had been on magnesium for approximately 6 hours for pre-eclampsia). She was transferred to [facility] for observation for 24hr. Magnesium sulphate and Methyldopa were discontinued after her BP were stable and patient was moved back to floor on 07/09/11. Patient is ready and stable for discharge today 07/10/11   Discharge Diagnoses:  1. Term Pregnancy Delivered 2. Gestational Hypertension 3. Post Partum Hemorrhage  4. Pre-Eclampsia   Discharge Information: Date: 07/10/2011 Activity: pelvic rest Diet: routine Medications: Ibuprofen and Iron Condition: stable Instructions: refer to practice specific booklet Discharge to: home Follow-up  Information    Follow up with [facility]. Make an appointment in 6 weeks. (Call earlier if you have problems )    Contact information:   [address] [place] [address]          Newborn Data: Live born female  Birth Weight: 8 lb 6.9 oz (3824 g) APGAR: 4, 7  Home with mother.  Chetan Kapat 07/10/2011, 10:09 AM

## 2011-07-10 NOTE — Progress Notes (Signed)
[facility] chart review completed.  

## 2011-07-10 NOTE — Progress Notes (Signed)
Post Partum Day 2 Subjective: no complaints, voiding, tolerating PO and + flatus + BM Pt is doing well and has no other complaints. Bleeding PV is minimal and appropriate for PP day2. She reports of edema on her lower limbs, is eating well, no fever, no visual disturbances, no lightheadedness, no abdominal pain Baby is doing well, bottle and breast fed and pt chooses Progesterone only pills as her contraceptive option.  Breast and bottle feeding.  Objective: Temp:  [97.9 F (36.6 C)-98.6 F (37 C)] 98.3 F (36.8 C) (10/29 0800) Pulse Rate:  [76-104] 82  (10/29 0800) Resp:  [16-20] 20  (10/29 0800) BP: (116-140)/(66-91) 121/89 mmHg (10/29 0800) SpO2:  [95 %-98 %] 98 % (10/28 1300)   Physical Exam:  General: alert, cooperative, appears stated age and no distress, obese - morbidly  Uterine Fundus: firm DVT Evaluation: No evidence of DVT seen on physical exam. Negative Homan's sign. No cords or calf tenderness. Edema ++   Basename 07/09/11 0515 07/08/11 1045  HGB 9.3* 10.6*  HCT 28.0* 31.6*    Lab 07/07/11 0912  NA 136  K 4.2  CL 104  CO2 22  BUN 10  CREATININE 0.54  CALCIUM 9.3  PROT 6.0  BILITOT 0.3  ALKPHOS 110  ALT 16  AST 21  GLUCOSE 66*       Assessment/Plan: Discharge home - prepare pt for discharge Edema - is likely to reduce with time. Patient to report is she has any SOB or if edema increased H/o of Preeclampsia and Hypertension during pregnancy - BP is well controlled Will be followed up at [facility] in 6 weeks for post partum care.     LOS: 3 days   Chetan Kapat 07/10/2011, 7:39 AM   I have seen patient and edited note accordingly.  Si Raider Maryann Conners.D.

## 2011-09-12 HISTORY — PX: CHOLECYSTECTOMY: SHX55

## 2013-05-13 ENCOUNTER — Telehealth: Payer: Self-pay | Admitting: Obstetrics & Gynecology

## 2013-10-02 NOTE — Telephone Encounter (Signed)
Closed encounter °

## 2014-04-07 ENCOUNTER — Telehealth: Payer: Self-pay | Admitting: Family Medicine

## 2014-04-07 NOTE — Telephone Encounter (Signed)
appt made

## 2014-05-13 ENCOUNTER — Ambulatory Visit: Payer: Self-pay | Admitting: Family

## 2014-06-15 ENCOUNTER — Encounter (INDEPENDENT_AMBULATORY_CARE_PROVIDER_SITE_OTHER): Payer: Self-pay

## 2014-06-15 ENCOUNTER — Ambulatory Visit (INDEPENDENT_AMBULATORY_CARE_PROVIDER_SITE_OTHER): Payer: PRIVATE HEALTH INSURANCE | Admitting: Family

## 2014-06-15 ENCOUNTER — Encounter: Payer: Self-pay | Admitting: Family

## 2014-06-15 VITALS — BP 173/101 | HR 76 | Temp 98.5°F | Ht 65.0 in | Wt 309.0 lb

## 2014-06-15 DIAGNOSIS — Z0000 Encounter for general adult medical examination without abnormal findings: Secondary | ICD-10-CM

## 2014-06-15 DIAGNOSIS — Z01419 Encounter for gynecological examination (general) (routine) without abnormal findings: Secondary | ICD-10-CM

## 2014-06-15 DIAGNOSIS — I10 Essential (primary) hypertension: Secondary | ICD-10-CM

## 2014-06-15 DIAGNOSIS — Z23 Encounter for immunization: Secondary | ICD-10-CM

## 2014-06-15 DIAGNOSIS — Z1272 Encounter for screening for malignant neoplasm of vagina: Secondary | ICD-10-CM

## 2014-06-15 MED ORDER — MUPIROCIN 2 % EX OINT: 1.0000 "application " | TOPICAL_OINTMENT | Freq: Two times a day (BID) | CUTANEOUS | Status: DC

## 2014-06-15 MED ORDER — LISINOPRIL-HYDROCHLOROTHIAZIDE 20-12.5 MG PO TABS: 1.0000 | ORAL_TABLET | Freq: Every day | ORAL | Status: DC

## 2014-06-15 NOTE — Patient Instructions (Signed)
Hypertension Hypertension, commonly called high blood pressure, is when the force of blood pumping through your arteries is too strong. Your arteries are the blood vessels that carry blood from your heart throughout your body. A blood pressure reading consists of a higher number over a lower number, such as 110/72. The higher number (systolic) is the pressure inside your arteries when your heart pumps. The lower number (diastolic) is the pressure inside your arteries when your heart relaxes. Ideally you want your blood pressure below 120/80. Hypertension forces your heart to work harder to pump blood. Your arteries may become narrow or stiff. Having hypertension puts you at risk for heart disease, stroke, and other problems.  RISK FACTORS Some risk factors for high blood pressure are controllable. Others are not.  Risk factors you cannot control include:   Race. You may be at higher risk if you are African American.  Age. Risk increases with age.  Gender. Men are at higher risk than women before age 45 years. After age 65, women are at higher risk than men. Risk factors you can control include:  Not getting enough exercise or physical activity.  Being overweight.  Getting too much fat, sugar, calories, or salt in your diet.  Drinking too much alcohol. SIGNS AND SYMPTOMS Hypertension does not usually cause signs or symptoms. Extremely high blood pressure (hypertensive crisis) may cause headache, anxiety, shortness of breath, and nosebleed. DIAGNOSIS  To check if you have hypertension, your health care provider will measure your blood pressure while you are seated, with your arm held at the level of your heart. It should be measured at least twice using the same arm. Certain conditions can cause a difference in blood pressure between your right and left arms. A blood pressure reading that is higher than normal on one occasion does not mean that you need treatment. If one blood pressure reading  is high, ask your health care provider about having it checked again. TREATMENT  Treating high blood pressure includes making lifestyle changes and possibly taking medicine. Living a healthy lifestyle can help lower high blood pressure. You may need to change some of your habits. Lifestyle changes may include:  Following the DASH diet. This diet is high in fruits, vegetables, and whole grains. It is low in salt, red meat, and added sugars.  Getting at least 2 hours of brisk physical activity every week.  Losing weight if necessary.  Not smoking.  Limiting alcoholic beverages.  Learning ways to reduce stress. If lifestyle changes are not enough to get your blood pressure under control, your health care provider may prescribe medicine. You may need to take more than one. Work closely with your health care provider to understand the risks and benefits. HOME CARE INSTRUCTIONS  Have your blood pressure rechecked as directed by your health care provider.   Take medicines only as directed by your health care provider. Follow the directions carefully. Blood pressure medicines must be taken as prescribed. The medicine does not work as well when you skip doses. Skipping doses also puts you at risk for problems.   Do not smoke.   Monitor your blood pressure at home as directed by your health care provider. SEEK MEDICAL CARE IF:   You think you are having a reaction to medicines taken.  You have recurrent headaches or feel dizzy.  You have swelling in your ankles.  You have trouble with your vision. SEEK IMMEDIATE MEDICAL CARE IF:  You develop a severe headache or confusion.    You have unusual weakness, numbness, or feel faint.  You have severe chest or abdominal pain.  You vomit repeatedly.  You have trouble breathing. MAKE SURE YOU:   Understand these instructions.  Will watch your condition.  Will get help right away if you are not doing well or get worse. Document  Released: 08/28/2005 Document Revised: 01/12/2014 Document Reviewed: 06/20/2013 [facility] Patient Information 2015 [place], [place]. This information is not intended to replace advice given to you by your health care provider. Make sure you discuss any questions you have with your health care provider. Health Maintenance Adopting a healthy lifestyle and getting preventive care can go a long way to promote health and wellness. Talk with your health care provider about what schedule of regular examinations is right for you. This is a good chance for you to check in with your provider about disease prevention and staying healthy. In between checkups, there are plenty of things you can do on your own. Experts have done a lot of research about which lifestyle changes and preventive measures are most likely to keep you healthy. Ask your health care provider for more information. WEIGHT AND DIET  Eat a healthy diet  Be sure to include plenty of vegetables, fruits, low-fat dairy products, and lean protein.  Do not eat a lot of foods high in solid fats, added sugars, or salt.  Get regular exercise. This is one of the most important things you can do for your health.  Most adults should exercise for at least 150 minutes each week. The exercise should increase your heart rate and make you sweat (moderate-intensity exercise).  Most adults should also do strengthening exercises at least twice a week. This is in addition to the moderate-intensity exercise.  Maintain a healthy weight  Body mass index (BMI) is a measurement that can be used to identify possible weight problems. It estimates body fat based on height and weight. Your health care provider can help determine your BMI and help you achieve or maintain a healthy weight.  For females 58 years of age and older:   A BMI below 18.5 is considered underweight.  A BMI of 18.5 to 24.9 is normal.  A BMI of 25 to 29.9 is considered overweight.  A BMI of  30 and above is considered obese.  Watch levels of cholesterol and blood lipids  You should start having your blood tested for lipids and cholesterol at 32 years of age, then have this test every 5 years.  You may need to have your cholesterol levels checked more often if:  Your lipid or cholesterol levels are high.  You are older than 32 years of age.  You are at high risk for heart disease.  CANCER SCREENING   Lung Cancer  Lung cancer screening is recommended for adults 21-41 years old who are at high risk for lung cancer because of a history of smoking.  A yearly low-dose CT scan of the lungs is recommended for people who:  Currently smoke.  Have quit within the past 15 years.  Have at least a 30-pack-year history of smoking. A pack year is smoking an average of one pack of cigarettes a day for 1 year.  Yearly screening should continue until it has been 15 years since you quit.  Yearly screening should stop if you develop a health problem that would prevent you from having lung cancer treatment.  Breast Cancer  Practice breast self-awareness. This means understanding how your breasts normally appear and  feel.  It also means doing regular breast self-exams. Let your health care provider know about any changes, no matter how small.  If you are in your 20s or 30s, you should have a clinical breast exam (CBE) by a health care provider every 1-3 years as part of a regular health exam.  If you are 75 or older, have a CBE every year. Also consider having a breast X-ray (mammogram) every year.  If you have a family history of breast cancer, talk to your health care provider about genetic screening.  If you are at high risk for breast cancer, talk to your health care provider about having an MRI and a mammogram every year.  Breast cancer gene (BRCA) assessment is recommended for women who have family members with BRCA-related cancers. BRCA-related cancers  include:  Breast.  Ovarian.  Tubal.  Peritoneal cancers.  Results of the assessment will determine the need for genetic counseling and BRCA1 and BRCA2 testing. Cervical Cancer Routine pelvic examinations to screen for cervical cancer are no longer recommended for nonpregnant women who are considered low risk for cancer of the pelvic organs (ovaries, uterus, and vagina) and who do not have symptoms. A pelvic examination may be necessary if you have symptoms including those associated with pelvic infections. Ask your health care provider if a screening pelvic exam is right for you.   The Pap test is the screening test for cervical cancer for women who are considered at risk.  If you had a hysterectomy for a problem that was not cancer or a condition that could lead to cancer, then you no longer need Pap tests.  If you are older than 65 years, and you have had normal Pap tests for the past 10 years, you no longer need to have Pap tests.  If you have had past treatment for cervical cancer or a condition that could lead to cancer, you need Pap tests and screening for cancer for at least 20 years after your treatment.  If you no longer get a Pap test, assess your risk factors if they change (such as having a new sexual partner). This can affect whether you should start being screened again.  Some women have medical problems that increase their chance of getting cervical cancer. If this is the case for you, your health care provider may recommend more frequent screening and Pap tests.  The human papillomavirus (HPV) test is another test that may be used for cervical cancer screening. The HPV test looks for the virus that can cause cell changes in the cervix. The cells collected during the Pap test can be tested for HPV.  The HPV test can be used to screen women 62 years of age and older. Getting tested for HPV can extend the interval between normal Pap tests from three to five years.  An HPV  test also should be used to screen women of any age who have unclear Pap test results.  After 32 years of age, women should have HPV testing as often as Pap tests.  Colorectal Cancer  This type of cancer can be detected and often prevented.  Routine colorectal cancer screening usually begins at 32 years of age and continues through 32 years of age.  Your health care provider may recommend screening at an earlier age if you have risk factors for colon cancer.  Your health care provider may also recommend using home test kits to check for hidden blood in the stool.  A small camera  at the end of a tube can be used to examine your colon directly (sigmoidoscopy or colonoscopy). This is done to check for the earliest forms of colorectal cancer.  Routine screening usually begins at age 28.  Direct examination of the colon should be repeated every 5-10 years through 32 years of age. However, you may need to be screened more often if early forms of precancerous polyps or small growths are found. Skin Cancer  Check your skin from head to toe regularly.  Tell your health care provider about any new moles or changes in moles, especially if there is a change in a mole's shape or color.  Also tell your health care provider if you have a mole that is larger than the size of a pencil eraser.  Always use sunscreen. Apply sunscreen liberally and repeatedly throughout the day.  Protect yourself by wearing long sleeves, pants, a wide-brimmed hat, and sunglasses whenever you are outside. HEART DISEASE, DIABETES, AND HIGH BLOOD PRESSURE   Have your blood pressure checked at least every 1-2 years. High blood pressure causes heart disease and increases the risk of stroke.  If you are between 20 years and 33 years old, ask your health care provider if you should take aspirin to prevent strokes.  Have regular diabetes screenings. This involves taking a blood sample to check your fasting blood sugar  level.  If you are at a normal weight and have a low risk for diabetes, have this test once every three years after 32 years of age.  If you are overweight and have a high risk for diabetes, consider being tested at a younger age or more often. PREVENTING INFECTION  Hepatitis B  If you have a higher risk for hepatitis B, you should be screened for this virus. You are considered at high risk for hepatitis B if:  You were born in a country where hepatitis B is common. Ask your health care provider which countries are considered high risk.  Your parents were born in a high-risk country, and you have not been immunized against hepatitis B (hepatitis B vaccine).  You have HIV or AIDS.  You use needles to inject street drugs.  You live with someone who has hepatitis B.  You have had sex with someone who has hepatitis B.  You get hemodialysis treatment.  You take certain medicines for conditions, including cancer, organ transplantation, and autoimmune conditions. Hepatitis C  Blood testing is recommended for:  Everyone born from [birth year] through 1964.  Anyone with known risk factors for hepatitis C. Sexually transmitted infections (STIs)  You should be screened for sexually transmitted infections (STIs) including gonorrhea and chlamydia if:  You are sexually active and are younger than 32 years of age.  You are older than 32 years of age and your health care provider tells you that you are at risk for this type of infection.  Your sexual activity has changed since you were last screened and you are at an increased risk for chlamydia or gonorrhea. Ask your health care provider if you are at risk.  If you do not have HIV, but are at risk, it may be recommended that you take a prescription medicine daily to prevent HIV infection. This is called pre-exposure prophylaxis (PrEP). You are considered at risk if:  You are sexually active and do not regularly use condoms or know the HIV status  of your partner(s).  You take drugs by injection.  You are sexually active with a partner  who has HIV. Talk with your health care provider about whether you are at high risk of being infected with HIV. If you choose to begin PrEP, you should first be tested for HIV. You should then be tested every 3 months for as long as you are taking PrEP.  PREGNANCY   If you are premenopausal and you may become pregnant, ask your health care provider about preconception counseling.  If you may become pregnant, take 400 to 800 micrograms (mcg) of folic acid every day.  If you want to prevent pregnancy, talk to your health care provider about birth control (contraception). OSTEOPOROSIS AND MENOPAUSE   Osteoporosis is a disease in which the bones lose minerals and strength with aging. This can result in serious bone fractures. Your risk for osteoporosis can be identified using a bone density scan.  If you are 38 years of age or older, or if you are at risk for osteoporosis and fractures, ask your health care provider if you should be screened.  Ask your health care provider whether you should take a calcium or vitamin D supplement to lower your risk for osteoporosis.  Menopause may have certain physical symptoms and risks.  Hormone replacement therapy may reduce some of these symptoms and risks. Talk to your health care provider about whether hormone replacement therapy is right for you.  HOME CARE INSTRUCTIONS   Schedule regular health, dental, and eye exams.  Stay current with your immunizations.   Do not use any tobacco products including cigarettes, chewing tobacco, or electronic cigarettes.  If you are pregnant, do not drink alcohol.  If you are breastfeeding, limit how much and how often you drink alcohol.  Limit alcohol intake to no more than 1 drink per day for nonpregnant women. One drink equals 12 ounces of beer, 5 ounces of wine, or 1 ounces of hard liquor.  Do not use street  drugs.  Do not share needles.  Ask your health care provider for help if you need support or information about quitting drugs.  Tell your health care provider if you often feel depressed.  Tell your health care provider if you have ever been abused or do not feel safe at home. Document Released: 03/13/2011 Document Revised: 01/12/2014 Document Reviewed: 07/30/2013 [facility] Patient Information 2015 [place], [place]. This information is not intended to replace advice given to you by your health care provider. Make sure you discuss any questions you have with your health care provider.

## 2014-06-15 NOTE — Progress Notes (Signed)
Subjective:    Patient ID: [name], female    DOB: February 10, 1981, 33 y.o.   MRN: [id number]  HPI Pt presents to the office for annual with pap. Pt currently not taking any medications at this time. Pt's blood pressure is elevated. Pt states she had been on blood pressure medication in the past, but once she became pregnant she went off of it and never restarted. Pt states she has not been on anything for her blood pressure for "years". She can not recall the name of the medication.  Pt denies any pain, palpitations, SOB, or edema.   Review of Systems  Constitutional: Negative.   HENT: Negative.   Eyes: Negative.   Respiratory: Negative.  Negative for shortness of breath.   Cardiovascular: Negative.  Negative for palpitations.  Gastrointestinal: Negative.   Endocrine: Negative.   Genitourinary: Negative.   Musculoskeletal: Negative.   Neurological: Negative.  Negative for headaches.  Hematological: Negative.   Psychiatric/Behavioral: Negative.   All other systems reviewed and are negative.      Objective:   Physical Exam  Vitals reviewed. Constitutional: She is oriented to person, place, and time. She appears well-developed and well-nourished. No distress.  HENT:  Head: Normocephalic and atraumatic.  Right Ear: External ear normal.  Left Ear: External ear normal.  Nose: Nose normal.  Mouth/Throat: Oropharynx is clear and moist.  Eyes: Pupils are equal, round, and reactive to light.  Neck: Normal range of motion. Neck supple. No thyromegaly present.  Cardiovascular: Normal rate, regular rhythm, normal heart sounds and intact distal pulses.   No murmur heard. Pulmonary/Chest: Effort normal and breath sounds normal. No respiratory distress. She has no wheezes. Right breast exhibits no inverted nipple, no mass, no nipple discharge, no skin change and no tenderness. Left breast exhibits no inverted nipple, no mass, no nipple discharge, no skin change and no tenderness.  Breasts are symmetrical.  Abdominal: Soft. Bowel sounds are normal. She exhibits no distension. There is no tenderness.  Genitourinary: Vagina normal.  Bimanual exam- no adnexal masses or tenderness, ovaries nonpalpable   Cervix parous and pink- No discharge   Musculoskeletal: Normal range of motion. She exhibits no edema and no tenderness.  Neurological: She is alert and oriented to person, place, and time. She has normal reflexes. No cranial nerve deficit.  Skin: Skin is warm and dry. Rash noted. Rash is papular (circular wart like on index of right finger).  Psychiatric: She has a normal mood and affect. Her behavior is normal. Judgment and thought content normal.    BP 173/101  Pulse 76  Temp(Src) 98.5 F (36.9 C) (Oral)  Ht $R'5\' 5"'EP$  (1.651 m)  Wt 309 lb (140.161 kg)  BMI 51.42 kg/m2  LMP 06/03/2014  Breastfeeding? No       Assessment & Plan:  1. Annual physical exam - CMP14+EGFR - Lipid panel - Thyroid Panel With TSH - Vit D  25 hydroxy (rtn osteoporosis monitoring) - Pap IG w/ reflex to HPV when ASC-U - lisinopril-hydrochlorothiazide (ZESTORETIC) 20-12.5 MG per tablet; Take 1 tablet by mouth daily.  Dispense: 90 tablet; Refill: 3  2. Encounter for routine gynecological examination - CMP14+EGFR - Pap IG w/ reflex to HPV when ASC-U  3. Essential hypertension - CMP14+EGFR -Pt started on lisinopril-HCTZ 20-12.5 daily today- Discussed preventing pregnancy while taking medication- Pt not able to be on birth control today b/c of BP    Continue all meds Labs pending Health Maintenance reviewed-Flu shot given today Diet and exercise  encouraged RTO 2 weeks for blood pressure  Evelina Dun, FNP

## 2014-06-16 LAB — CMP14+EGFR
ALK PHOS: 87 IU/L (ref 39–117)
ALT: 15 IU/L (ref 0–32)
AST: 20 IU/L (ref 0–40)
Albumin/Globulin Ratio: 1.4 (ref 1.1–2.5)
Albumin: 3.9 g/dL (ref 3.5–5.5)
BUN / CREAT RATIO: 15 (ref 8–20)
BUN: 12 mg/dL (ref 6–20)
CHLORIDE: 101 mmol/L (ref 97–108)
CO2: 24 mmol/L (ref 18–29)
Calcium: 9 mg/dL (ref 8.7–10.2)
Creatinine, Ser: 0.78 mg/dL (ref 0.57–1.00)
GFR calc Af Amer: 116 mL/min/{1.73_m2} (ref >59)
GFR calc non Af Amer: 101 mL/min/{1.73_m2} (ref >59)
GLOBULIN, TOTAL: 2.8 g/dL (ref 1.5–4.5)
Glucose: 84 mg/dL (ref 65–99)
Potassium: 4 mmol/L (ref 3.5–5.2)
SODIUM: 139 mmol/L (ref 134–144)
Total Bilirubin: <0.2 mg/dL (ref 0.0–1.2)
Total Protein: 6.7 g/dL (ref 6.0–8.5)

## 2014-06-16 LAB — THYROID PANEL WITH TSH
FREE THYROXINE INDEX: 2.1 (ref 1.2–4.9)
T3 UPTAKE RATIO: 30 % (ref 24–39)
T4, Total: 7.1 ug/dL (ref 4.5–12.0)
TSH: 4.66 u[IU]/mL — ABNORMAL HIGH (ref 0.450–4.500)

## 2014-06-16 LAB — VITAMIN D 25 HYDROXY (VIT D DEFICIENCY, FRACTURES): Vit D, 25-Hydroxy: 22 ng/mL — ABNORMAL LOW (ref 30.0–100.0)

## 2014-06-16 LAB — LIPID PANEL
CHOL/HDL RATIO: 2.3 ratio (ref 0.0–4.4)
CHOLESTEROL TOTAL: 144 mg/dL (ref 100–199)
HDL: 64 mg/dL (ref >39)
LDL Calculated: 65 mg/dL (ref 0–99)
TRIGLYCERIDES: 76 mg/dL (ref 0–149)
VLDL Cholesterol Cal: 15 mg/dL (ref 5–40)

## 2014-06-18 LAB — PAP IG W/ RFLX HPV ASCU: PAP SMEAR COMMENT: 0

## 2014-06-19 ENCOUNTER — Other Ambulatory Visit: Payer: Self-pay | Admitting: Family

## 2014-06-19 DIAGNOSIS — E039 Hypothyroidism, unspecified: Secondary | ICD-10-CM

## 2014-06-19 DIAGNOSIS — I10 Essential (primary) hypertension: Secondary | ICD-10-CM | POA: Insufficient documentation

## 2014-06-19 MED ORDER — VITAMIN D (ERGOCALCIFEROL) 1.25 MG (50000 UNIT) PO CAPS: 50000.0000 [IU] | ORAL_CAPSULE | ORAL | Status: DC

## 2014-06-19 MED ORDER — LEVOTHYROXINE SODIUM 50 MCG PO TABS
50.0000 ug | ORAL_TABLET | Freq: Every day | ORAL | Status: DC
Start: 2014-06-19 — End: 2015-05-05

## 2014-06-19 NOTE — Progress Notes (Signed)
Pt's phone stated 'wireless' phone not available at this time

## 2014-06-23 ENCOUNTER — Telehealth: Payer: Self-pay | Admitting: Family Medicine

## 2014-06-29 ENCOUNTER — Encounter: Payer: Self-pay | Admitting: Family Medicine

## 2014-06-29 ENCOUNTER — Ambulatory Visit (INDEPENDENT_AMBULATORY_CARE_PROVIDER_SITE_OTHER): Payer: PRIVATE HEALTH INSURANCE | Admitting: Family Medicine

## 2014-06-29 VITALS — BP 115/73 | HR 89 | Temp 98.4°F | Ht 65.0 in | Wt 308.1 lb

## 2014-06-29 DIAGNOSIS — I10 Essential (primary) hypertension: Secondary | ICD-10-CM

## 2014-06-29 NOTE — Progress Notes (Signed)
   Subjective:    Patient ID: [name], female    DOB: 1981-08-02, 33 y.o.   MRN: [id number]  HPI  This 33 y.o. female presents for evaluation of follow up bp visit.  Review of Systems No chest pain, SOB, HA, dizziness, vision change, N/V, diarrhea, constipation, dysuria, urinary urgency or frequency, myalgias, arthralgias or rash.     Objective:   Physical Exam  Vital signs noted  Well developed well nourished female.  HEENT - Head atraumatic Normocephalic                Eyes - PERRLA, Conjuctiva - clear Sclera- Clear EOMI                Ears - EAC's Wnl TM's Wnl Gross Hearing WNL                Nose - Nares patent                 Throat - oropharanx wnl Respiratory - Lungs CTA bilateral Cardiac - RRR S1 and S2 without murmur GI - Abdomen soft Nontender and bowel sounds active x 4 Extremities - No edema. Neuro - Grossly intact.      Assessment & Plan:  Essential hypertension - Plan: BMP8+EGFR  BP controlled  Lysbeth Penner FNP

## 2014-06-30 LAB — BMP8+EGFR
BUN/Creatinine Ratio: 20 (ref 8–20)
BUN: 17 mg/dL (ref 6–20)
CO2: 24 mmol/L (ref 18–29)
Calcium: 9.3 mg/dL (ref 8.7–10.2)
Chloride: 99 mmol/L (ref 97–108)
Creatinine, Ser: 0.87 mg/dL (ref 0.57–1.00)
GFR calc Af Amer: 102 mL/min/{1.73_m2} (ref >59)
GFR calc non Af Amer: 88 mL/min/{1.73_m2} (ref >59)
Glucose: 83 mg/dL (ref 65–99)
Potassium: 4.1 mmol/L (ref 3.5–5.2)
Sodium: 138 mmol/L (ref 134–144)

## 2014-06-30 NOTE — Telephone Encounter (Signed)
No answer when trying to contact patient. Will await response from patient

## 2014-07-06 NOTE — Telephone Encounter (Signed)
Patient seen 10-19.

## 2014-07-13 ENCOUNTER — Encounter: Payer: Self-pay | Admitting: Family Medicine

## 2014-09-15 ENCOUNTER — Other Ambulatory Visit (INDEPENDENT_AMBULATORY_CARE_PROVIDER_SITE_OTHER): Payer: PRIVATE HEALTH INSURANCE

## 2014-09-15 DIAGNOSIS — E039 Hypothyroidism, unspecified: Secondary | ICD-10-CM

## 2014-09-15 NOTE — Progress Notes (Signed)
Lab only 

## 2014-09-16 LAB — THYROID PANEL WITH TSH
FREE THYROXINE INDEX: 1.7 (ref 1.2–4.9)
T3 Uptake Ratio: 27 % (ref 24–39)
T4, Total: 6.4 ug/dL (ref 4.5–12.0)
TSH: 2.62 u[IU]/mL (ref 0.450–4.500)

## 2014-09-21 ENCOUNTER — Telehealth: Payer: Self-pay | Admitting: Family

## 2014-09-21 NOTE — Telephone Encounter (Signed)
-----   Message from Sharion Balloon, [place] sent at 09/21/2014 12:59 PM EST ----- Thyroid levels WNL

## 2014-09-21 NOTE — Telephone Encounter (Signed)
Pt notified to continue thyroid med Verbalizes understanding

## 2014-09-21 NOTE — Telephone Encounter (Signed)
Pt aware of thyroid results.  She wants to know if she needs to continue with med.  Call her at [phone]

## 2014-10-22 ENCOUNTER — Encounter: Payer: Self-pay | Admitting: Family

## 2014-10-22 ENCOUNTER — Ambulatory Visit (INDEPENDENT_AMBULATORY_CARE_PROVIDER_SITE_OTHER): Payer: PRIVATE HEALTH INSURANCE | Admitting: Family

## 2014-10-22 VITALS — BP 149/96 | HR 73 | Temp 99.4°F | Ht 65.0 in | Wt 315.6 lb

## 2014-10-22 DIAGNOSIS — L02414 Cutaneous abscess of left upper limb: Secondary | ICD-10-CM

## 2014-10-22 MED ORDER — SULFAMETHOXAZOLE-TRIMETHOPRIM 800-160 MG PO TABS: 1.0000 | ORAL_TABLET | Freq: Two times a day (BID) | ORAL | Status: DC

## 2014-10-22 MED ORDER — MUPIROCIN 2 % EX OINT: 1.0000 "application " | TOPICAL_OINTMENT | Freq: Two times a day (BID) | CUTANEOUS | Status: DC

## 2014-10-22 NOTE — Progress Notes (Signed)
   Subjective:    Patient ID: [name], female    DOB: [birth year], 34 y.o.   MRN: [id number]  HPI Pt presents to the office for an infected spot on her left forearm. Pt states she noticed it about a week and half ago. Pt states it is draining a clear yellowish. Pt denies any spider bite or insect bite that she knows of. Pt has tried "squezzing" and applied some antibiotic cream with no relief.    Review of Systems  Constitutional: Negative.   HENT: Negative.   Eyes: Negative.   Respiratory: Negative.  Negative for shortness of breath.   Cardiovascular: Negative.  Negative for palpitations.  Gastrointestinal: Negative.   Endocrine: Negative.   Genitourinary: Negative.   Musculoskeletal: Negative.   Neurological: Negative.  Negative for headaches.  Hematological: Negative.   Psychiatric/Behavioral: Negative.   All other systems reviewed and are negative.      Objective:   Physical Exam  Constitutional: She is oriented to person, place, and time. She appears well-developed and well-nourished. No distress.  Eyes: Pupils are equal, round, and reactive to light.  Neck: Normal range of motion. Neck supple. No thyromegaly present.  Cardiovascular: Normal rate, regular rhythm, normal heart sounds and intact distal pulses.   No murmur heard. Pulmonary/Chest: Effort normal and breath sounds normal. No respiratory distress. She has no wheezes.  Abdominal: Soft. Bowel sounds are normal. She exhibits no distension. There is no tenderness.  Musculoskeletal: Normal range of motion. She exhibits no edema or tenderness.  Neurological: She is alert and oriented to person, place, and time. She has normal reflexes. No cranial nerve deficit.  Skin: Skin is warm and dry. There is erythema.  circlular erythemas  Hard abscess on left forearm.   Psychiatric: She has a normal mood and affect. Her behavior is normal. Judgment and thought content normal.  Vitals reviewed.   BP 149/96 mmHg  Pulse  73  Temp(Src) 99.4 F (37.4 C) (Oral)  Ht 5\' 5"  (1.651 m)  Wt 315 lb 9.6 oz (143.155 kg)  BMI 52.52 kg/m2       Assessment & Plan:  1. Abscess of arm, left -Do not pick or squeeze -Warm compresses -RTO prn or if it worsens - sulfamethoxazole-trimethoprim (BACTRIM DS,SEPTRA DS) 800-160 MG per tablet; Take 1 tablet by mouth 2 (two) times daily.  Dispense: 10 tablet; Refill: 0 - mupirocin ointment (BACTROBAN) 2 %; Place 1 application into the nose 2 (two) times daily.  Dispense: 22 g; Refill: 0  *Pt states she has not taken BP meds the last week, but states she just picked up her RX today.  Evelina Dun, FNP

## 2014-10-22 NOTE — Patient Instructions (Signed)

## 2014-11-11 ENCOUNTER — Ambulatory Visit (INDEPENDENT_AMBULATORY_CARE_PROVIDER_SITE_OTHER): Payer: PRIVATE HEALTH INSURANCE | Admitting: Family

## 2014-11-11 ENCOUNTER — Encounter: Payer: Self-pay | Admitting: Family

## 2014-11-11 ENCOUNTER — Ambulatory Visit: Payer: PRIVATE HEALTH INSURANCE | Admitting: Family

## 2014-11-11 VITALS — BP 131/84 | HR 84 | Temp 98.5°F | Ht 65.0 in | Wt 316.0 lb

## 2014-11-11 DIAGNOSIS — L0291 Cutaneous abscess, unspecified: Secondary | ICD-10-CM | POA: Diagnosis not present

## 2014-11-11 DIAGNOSIS — Z713 Dietary counseling and surveillance: Secondary | ICD-10-CM

## 2014-11-11 MED ORDER — PHENTERMINE HCL 37.5 MG PO CAPS: 37.5000 mg | ORAL_CAPSULE | ORAL | Status: DC

## 2014-11-11 MED ORDER — SULFAMETHOXAZOLE-TRIMETHOPRIM 800-160 MG PO TABS: 1.0000 | ORAL_TABLET | Freq: Two times a day (BID) | ORAL | Status: DC

## 2014-11-11 NOTE — Progress Notes (Signed)
   Subjective:    Patient ID: [name], female    DOB: 26-Jan-1981, 34 y.o.   MRN: [id number]  HPI Pt presents to the office to recheck absecess of left forearm. Pt was seen on 10/22/14 and given bactrim for 5 days. Pt states her arm drained "alot" and feels much better, but still is a little hard. Pt would also like to discuss weight loss today.    Review of Systems  Constitutional: Negative.   HENT: Negative.   Eyes: Negative.   Respiratory: Negative.  Negative for shortness of breath.   Cardiovascular: Negative.  Negative for palpitations.  Gastrointestinal: Negative.   Endocrine: Negative.   Genitourinary: Negative.   Musculoskeletal: Negative.   Neurological: Negative.  Negative for headaches.  Hematological: Negative.   Psychiatric/Behavioral: Negative.   All other systems reviewed and are negative.      Objective:   Physical Exam  Constitutional: She is oriented to person, place, and time. She appears well-developed and well-nourished. No distress.  HENT:  Head: Normocephalic and atraumatic.  Right Ear: External ear normal.  Mouth/Throat: Oropharynx is clear and moist.  Eyes: Pupils are equal, round, and reactive to light.  Neck: Normal range of motion. Neck supple. No thyromegaly present.  Cardiovascular: Normal rate, regular rhythm, normal heart sounds and intact distal pulses.   No murmur heard. Pulmonary/Chest: Effort normal and breath sounds normal. No respiratory distress. She has no wheezes.  Abdominal: Soft. Bowel sounds are normal. She exhibits no distension. There is no tenderness.  Musculoskeletal: Normal range of motion. She exhibits no edema or tenderness.  Neurological: She is alert and oriented to person, place, and time. She has normal reflexes. No cranial nerve deficit.  Skin: Skin is warm and dry.  Small hard abscess on left forearm   Psychiatric: She has a normal mood and affect. Her behavior is normal. Judgment and thought content normal.    Vitals reviewed.    BP 131/84 mmHg  Pulse 84  Temp(Src) 98.5 F (36.9 C) (Oral)  Ht 5\' 5"  (1.651 m)  Wt 316 lb (143.337 kg)  BMI 52.59 kg/m2      Assessment & Plan:  1. Abscess -Keep clean and dry -Do not pick or squeeze  - sulfamethoxazole-trimethoprim (BACTRIM DS,SEPTRA DS) 800-160 MG per tablet; Take 1 tablet by mouth 2 (two) times daily.  Dispense: 10 tablet; Refill: 0  2. Weight loss counseling, encounter for -Healthy diet and exercise  -RTO in 12 weeks to discuss weight loss- If no weight loss in 12 weeks will d/c medication - phentermine 37.5 MG capsule; Take 1 capsule (37.5 mg total) by mouth every morning.  Dispense: 30 capsule; Refill: 3  3. Morbid obesity - phentermine 37.5 MG capsule; Take 1 capsule (37.5 mg total) by mouth every morning.  Dispense: 30 capsule; Refill: Angelica, FNP

## 2014-11-11 NOTE — Patient Instructions (Signed)
Phentermine orally disintegrating tablets (ODT) What is this medicine? PHENTERMINE (FEN ter meen) decreases your appetite. It is used with a reduced calorie diet and exercise to help you lose weight. This medicine may be used for other purposes; ask your health care provider or pharmacist if you have questions. COMMON BRAND NAME(S): Suprenza What should I tell my health care provider before I take this medicine? They need to know if you have any of these conditions: -agitation -glaucoma -heart disease -high blood pressure -history of substance abuse -lung disease called Primary Pulmonary Hypertension (PPH) -taken an MAOI like Carbex, Eldepryl, Marplan, Nardil, or Parnate in last 14 days -thyroid disease -an unusual or allergic reaction to phentermine, other medicines, foods, dyes, or preservatives -pregnant or trying to get pregnant -breast-feeding How should I use this medicine? Take this medicine by mouth. Follow the directions on the prescription label. The tablets should stay in the bottle until immediately before you take your dose. Use dry hands to remove the dose from the bottle. Do not cut or split the tablets in half. Place the tablet in the mouth and allow it to dissolve, and then swallow. While you may take these tablets with water, it is not necessary to do so. Take your doses at regular intervals. Do not take your medicine more often than directed. Do not stop taking except on the advice of your doctor or health care professional. Talk to your pediatrician regarding the use of this medicine in children. Special care may be needed. Overdosage: If you think you've taken too much of this medicine contact a poison control center or emergency room at once. Overdosage: If you think you have taken too much of this medicine contact a poison control center or emergency room at once. NOTE: This medicine is only for you. Do not share this medicine with others. What if I miss a dose? If you  miss a dose, take it as soon as you can. If it is almost time for your next dose, take only that dose. Do not take double or extra doses. What may interact with this medicine? Do not take this medicine with any of the following medications: -duloxetine -MAOIs like Carbex, Eldepryl, Marplan, Nardil, and Parnate -medicines for colds or breathing difficulties like pseudoephedrine or phenylephrine -procarbazine -sibutramine -SSRIs like citalopram, escitalopram, fluoxetine, fluvoxamine, paroxetine, and sertraline -stimulants like dexmethylphenidate, methylphenidate or modafinil -venlafaxine This medicine may also interact with the following medications: -medicines for diabetes This list may not describe all possible interactions. Give your health care provider a list of all the medicines, herbs, non-prescription drugs, or dietary supplements you use. Also tell them if you smoke, drink alcohol, or use illegal drugs. Some items may interact with your medicine. What should I watch for while using this medicine? Notify your physician immediately if you become short of breath while doing your normal activities. Do not take this medicine within 6 hours of bedtime. It can keep you from getting to sleep. Avoid drinks that contain caffeine and try to stick to a regular bedtime every night. This medicine is intended to be used in addition to a healthy diet and exercise. The best results are achieved this way. This medicine is only indicated for short-term use. Eventually your weight loss may level out. At that point, the drug will only help you maintain your new weight. Do not increase or in any way change your dose without consulting your doctor. You may get drowsy or dizzy. Do not drive, use machinery, or  do anything that needs mental alertness until you know how this medicine affects you. Do not stand or sit up quickly, especially if you are an older patient. This reduces the risk of dizzy or fainting spells.  Alcohol may increase dizziness and drowsiness. Avoid alcoholic drinks. What side effects may I notice from receiving this medicine? Side effects that you should report to your doctor or health care professional as soon as possible: -chest pain, palpitations -depression or severe changes in mood -increased blood pressure -irritability -nervousness or restlessness -severe dizziness -shortness of breath -problems urinating -unusual swelling of the legs -vomiting Side effects that usually do not require medical attention (report to your doctor or health care professional if they continue or are bothersome): -blurred vision or other eye problems -changes in sexual ability or desire -constipation or diarrhea -difficulty sleeping -dry mouth or unpleasant taste -headache -nausea This list may not describe all possible side effects. Call your doctor for medical advice about side effects. You may report side effects to FDA at 1-800-FDA-1088. Where should I keep my medicine? Keep out of the reach of children. This medicine can be abused. Keep your medicine in a safe place to protect it from theft. Do not share this medicine with anyone. Selling or giving away this medicine is dangerous and against the law. Store at room temperature between 20 and 25 degrees C (68 and 77 degrees F). Keep container tightly closed. Throw away any unused medicine after the expiration date. NOTE: This sheet is a summary. It may not cover all possible information. If you have questions about this medicine, talk to your doctor, pharmacist, or health care provider.  2015, [employer]. (2010-10-12 10:40:21) Exercise to Lose Weight Exercise and a healthy diet may help you lose weight. Your doctor may suggest specific exercises. EXERCISE IDEAS AND TIPS  Choose low-cost things you enjoy doing, such as walking, bicycling, or exercising to workout videos.  Take stairs instead of the elevator.  Walk during your  lunch break.  Park your car further away from work or school.  Go to a gym or an exercise class.  Start with 5 to 10 minutes of exercise each day. Build up to 30 minutes of exercise 4 to 6 days a week.  Wear shoes with good support and comfortable clothes.  Stretch before and after working out.  Work out until you breathe harder and your heart beats faster.  Drink extra water when you exercise.  Do not do so much that you hurt yourself, feel dizzy, or get very short of breath. Exercises that burn about 150 calories:  Running 1  miles in 15 minutes.  Playing volleyball for 45 to 60 minutes.  Washing and waxing a car for 45 to 60 minutes.  Playing touch football for 45 minutes.  Walking 1  miles in 35 minutes.  Pushing a stroller 1  miles in 30 minutes.  Playing basketball for 30 minutes.  Raking leaves for 30 minutes.  Bicycling 5 miles in 30 minutes.  Walking 2 miles in 30 minutes.  Dancing for 30 minutes.  Shoveling snow for 15 minutes.  Swimming laps for 20 minutes.  Walking up stairs for 15 minutes.  Bicycling 4 miles in 15 minutes.  Gardening for 30 to 45 minutes.  Jumping rope for 15 minutes.  Washing windows or floors for 45 to 60 minutes. Document Released: 09/30/2010 Document Revised: 11/20/2011 Document Reviewed: 09/30/2010 [facility] Patient Information 2015 [place], [place]. This information is not intended to replace advice  given to you by your health care provider. Make sure you discuss any questions you have with your health care provider. Calorie Counting for Weight Loss Calories are energy you get from the things you eat and drink. Your body uses this energy to keep you going throughout the day. The number of calories you eat affects your weight. When you eat more calories than your body needs, your body stores the extra calories as fat. When you eat fewer calories than your body needs, your body burns fat to get the energy it needs. Calorie  counting means keeping track of how many calories you eat and drink each day. If you make sure to eat fewer calories than your body needs, you should lose weight. In order for calorie counting to work, you will need to eat the number of calories that are right for you in a day to lose a healthy amount of weight per week. A healthy amount of weight to lose per week is usually 1-2 lb (0.5-0.9 kg). A dietitian can determine how many calories you need in a day and give you suggestions on how to reach your calorie goal.  WHAT IS MY MY PLAN? My goal is to have __________ calories per day.  If I have this many calories per day, I should lose around __________ pounds per week. WHAT DO I NEED TO KNOW ABOUT CALORIE COUNTING? In order to meet your daily calorie goal, you will need to:  Find out how many calories are in each food you would like to eat. Try to do this before you eat.  Decide how much of the food you can eat.  Write down what you ate and how many calories it had. Doing this is called keeping a food log. WHERE DO I FIND CALORIE INFORMATION? The number of calories in a food can be found on a Nutrition Facts label. Note that all the information on a label is based on a specific serving of the food. If a food does not have a Nutrition Facts label, try to look up the calories online or ask your dietitian for help. HOW DO I DECIDE HOW MUCH TO EAT? To decide how much of the food you can eat, you will need to consider both the number of calories in one serving and the size of one serving. This information can be found on the Nutrition Facts label. If a food does not have a Nutrition Facts label, look up the information online or ask your dietitian for help. Remember that calories are listed per serving. If you choose to have more than one serving of a food, you will have to multiply the calories per serving by the amount of servings you plan to eat. For example, the label on a package of bread might say  that a serving size is 1 slice and that there are 90 calories in a serving. If you eat 1 slice, you will have eaten 90 calories. If you eat 2 slices, you will have eaten 180 calories. HOW DO I KEEP A FOOD LOG? After each meal, record the following information in your food log:  What you ate.  How much of it you ate.  How many calories it had.  Then, add up your calories. Keep your food log near you, such as in a small notebook in your pocket. Another option is to use a mobile app or website. Some programs will calculate calories for you and show you how many calories you have  left each time you add an item to the log. WHAT ARE SOME CALORIE COUNTING TIPS?  Use your calories on foods and drinks that will fill you up and not leave you hungry. Some examples of this include foods like nuts and nut butters, vegetables, lean proteins, and high-fiber foods (more than 5 g fiber per serving).  Eat nutritious foods and avoid empty calories. Empty calories are calories you get from foods or beverages that do not have many nutrients, such as candy and soda. It is better to have a nutritious high-calorie food (such as an avocado) than a food with few nutrients (such as a bag of chips).  Know how many calories are in the foods you eat most often. This way, you do not have to look up how many calories they have each time you eat them.  Look out for foods that may seem like low-calorie foods but are really high-calorie foods, such as baked goods, soda, and fat-free candy.  Pay attention to calories in drinks. Drinks such as sodas, specialty coffee drinks, alcohol, and juices have a lot of calories yet do not fill you up. Choose low-calorie drinks like water and diet drinks.  Focus your calorie counting efforts on higher calorie items. Logging the calories in a garden salad that contains only vegetables is less important than calculating the calories in a milk shake.  Find a way of tracking calories that  works for you. Get creative. Most people who are successful find ways to keep track of how much they eat in a day, even if they do not count every calorie. WHAT ARE SOME PORTION CONTROL TIPS?  Know how many calories are in a serving. This will help you know how many servings of a certain food you can have.  Use a measuring cup to measure serving sizes. This is helpful when you start out. With time, you will be able to estimate serving sizes for some foods.  Take some time to put servings of different foods on your favorite plates, bowls, and cups so you know what a serving looks like.  Try not to eat straight from a bag or box. Doing this can lead to overeating. Put the amount you would like to eat in a cup or on a plate to make sure you are eating the right portion.  Use smaller plates, glasses, and bowls to prevent overeating. This is a quick and easy way to practice portion control. If your plate is smaller, less food can fit on it.  Try not to multitask while eating, such as watching TV or using your computer. If it is time to eat, sit down at a table and enjoy your food. Doing this will help you to start recognizing when you are full. It will also make you more aware of what and how much you are eating. HOW CAN I CALORIE COUNT WHEN EATING OUT?  Ask for smaller portion sizes or child-sized portions.  Consider sharing an entree and sides instead of getting your own entree.  If you get your own entree, eat only half. Ask for a box at the beginning of your meal and put the rest of your entree in it so you are not tempted to eat it.  Look for the calories on the menu. If calories are listed, choose the lower calorie options.  Choose dishes that include vegetables, fruits, whole grains, low-fat dairy products, and lean protein. Focusing on smart food choices from each of the 5 food  groups can help you stay on track at restaurants.  Choose items that are boiled, broiled, grilled, or  steamed.  Choose water, milk, unsweetened iced tea, or other drinks without added sugars. If you want an alcoholic beverage, choose a lower calorie option. For example, a regular margarita can have up to 700 calories and a glass of wine has around 150.  Stay away from items that are buttered, battered, fried, or served with cream sauce. Items labeled "crispy" are usually fried, unless stated otherwise.  Ask for dressings, sauces, and syrups on the side. These are usually very high in calories, so do not eat much of them.  Watch out for salads. Many people think salads are a healthy option, but this is often not the case. Many salads come with bacon, fried chicken, lots of cheese, fried chips, and dressing. All of these items have a lot of calories. If you want a salad, choose a garden salad and ask for grilled meats or steak. Ask for the dressing on the side, or ask for olive oil and vinegar or lemon to use as dressing.  Estimate how many servings of a food you are given. For example, a serving of cooked rice is  cup or about the size of half a tennis ball or one cupcake wrapper. Knowing serving sizes will help you be aware of how much food you are eating at restaurants. The list below tells you how big or small some common portion sizes are based on everyday objects.  1 oz--4 stacked dice.  3 oz--1 deck of cards.  1 tsp--1 dice.  1 Tbsp-- a Ping-Pong ball.  2 Tbsp--1 Ping-Pong ball.   cup--1 tennis ball or 1 cupcake wrapper.  1 cup--1 baseball. Document Released: 08/28/2005 Document Revised: 01/12/2014 Document Reviewed: 07/03/2013 [facility] Patient Information 2015 [place], [place]. This information is not intended to replace advice given to you by your health care provider. Make sure you discuss any questions you have with your health care provider.

## 2015-03-22 ENCOUNTER — Encounter: Payer: Self-pay | Admitting: Adult Health

## 2015-03-22 ENCOUNTER — Ambulatory Visit (INDEPENDENT_AMBULATORY_CARE_PROVIDER_SITE_OTHER): Payer: PRIVATE HEALTH INSURANCE | Admitting: Adult Health

## 2015-03-22 VITALS — BP 140/80 | HR 72 | Ht 65.0 in | Wt 301.0 lb

## 2015-03-22 DIAGNOSIS — E039 Hypothyroidism, unspecified: Secondary | ICD-10-CM

## 2015-03-22 DIAGNOSIS — N926 Irregular menstruation, unspecified: Secondary | ICD-10-CM

## 2015-03-22 DIAGNOSIS — Z3201 Encounter for pregnancy test, result positive: Secondary | ICD-10-CM

## 2015-03-22 DIAGNOSIS — Z3490 Encounter for supervision of normal pregnancy, unspecified, unspecified trimester: Secondary | ICD-10-CM

## 2015-03-22 DIAGNOSIS — O3680X0 Pregnancy with inconclusive fetal viability, not applicable or unspecified: Secondary | ICD-10-CM

## 2015-03-22 DIAGNOSIS — R110 Nausea: Secondary | ICD-10-CM

## 2015-03-22 DIAGNOSIS — I10 Essential (primary) hypertension: Secondary | ICD-10-CM

## 2015-03-22 HISTORY — DX: Encounter for supervision of normal pregnancy, unspecified, unspecified trimester: Z34.90

## 2015-03-22 LAB — POCT URINE PREGNANCY: Preg Test, Ur: POSITIVE — AB

## 2015-03-22 MED ORDER — LABETALOL HCL 100 MG PO TABS: 100.0000 mg | ORAL_TABLET | Freq: Two times a day (BID) | ORAL | Status: DC

## 2015-03-22 MED ORDER — PRENATAL PLUS 27-1 MG PO TABS: 1.0000 | ORAL_TABLET | Freq: Every day | ORAL | Status: DC

## 2015-03-22 NOTE — Progress Notes (Signed)
Subjective:     Patient ID: [name], female   DOB: [birth year], 34 y.o.   MRN: [id number]  HPI [name] is a 34 year old black female in for UPT, has missed a period and has nausea, she stopped taking her synthroid and BP meds about 1-2 weeks ago, when she thought she was pregnant.  Review of Systems Patient denies any headaches, hearing loss, fatigue, blurred vision, shortness of breath, chest pain, abdominal pain, problems with bowel movements, urination, or intercourse. No joint pain or mood swings.+nausea and missed period Reviewed past medical,surgical, social and family history. Reviewed medications and allergies.     Objective:   Physical Exam BP 140/80 mmHg  Pulse 72  Ht 5\' 5"  (1.651 m)  Wt 301 lb (136.533 kg)  BMI 50.09 kg/m2  LMP 02/10/2015 UPT +, about 5+5 weeks by LMP with EDD 11/17/15, medicaid form given. Skin warm and dry. Neck: mid line trachea, normal thyroid, good ROM, no lymphadenopathy noted. Lungs: clear to ausculation bilaterally. Cardiovascular: regular rate and rhythm.Abdomen soft, non tender and obese.   Told her to start back on synthroid and will give her another BP med.She said she wants her tubes tied in hospital.  Assessment:     Pregnant Hypertension Hypothyroid Obesity     Plan:    Start back on synthroid  Return in 2 weeks for dating [place] Rx prenatal plus #30 take 1 daily with 11 refills Rx labetalol 100 mg #60 1 bid with 11 refills Eat often to help with nausea Review handout on first trimester

## 2015-03-22 NOTE — Patient Instructions (Signed)
First Trimester of Pregnancy The first trimester of pregnancy is from week 1 until the end of week 12 (months 1 through 3). A week after a sperm fertilizes an egg, the egg will implant on the wall of the uterus. This embryo will begin to develop into a baby. Genes from you and your partner are forming the baby. The female genes determine whether the baby is a boy or a girl. At 6-8 weeks, the eyes and face are formed, and the heartbeat can be seen on ultrasound. At the end of 12 weeks, all the baby's organs are formed.  Now that you are pregnant, you will want to do everything you can to have a healthy baby. Two of the most important things are to get good prenatal care and to follow your health care provider's instructions. Prenatal care is all the medical care you receive before the baby's birth. This care will help prevent, find, and treat any problems during the pregnancy and childbirth. BODY CHANGES Your body goes through many changes during pregnancy. The changes vary from woman to woman.   You may gain or lose a couple of pounds at first.  You may feel sick to your stomach (nauseous) and throw up (vomit). If the vomiting is uncontrollable, call your health care provider.  You may tire easily.  You may develop headaches that can be relieved by medicines approved by your health care provider.  You may urinate more often. Painful urination may mean you have a bladder infection.  You may develop heartburn as a result of your pregnancy.  You may develop constipation because certain hormones are causing the muscles that push waste through your intestines to slow down.  You may develop hemorrhoids or swollen, bulging veins (varicose veins).  Your breasts may begin to grow larger and become tender. Your nipples may stick out more, and the tissue that surrounds them (areola) may become darker.  Your gums may bleed and may be sensitive to brushing and flossing.  Dark spots or blotches (chloasma,  mask of pregnancy) may develop on your face. This will likely fade after the baby is born.  Your menstrual periods will stop.  You may have a loss of appetite.  You may develop cravings for certain kinds of food.  You may have changes in your emotions from day to day, such as being excited to be pregnant or being concerned that something may go wrong with the pregnancy and baby.  You may have more vivid and strange dreams.  You may have changes in your hair. These can include thickening of your hair, rapid growth, and changes in texture. Some women also have hair loss during or after pregnancy, or hair that feels dry or thin. Your hair will most likely return to normal after your baby is born. WHAT TO EXPECT AT YOUR PRENATAL VISITS During a routine prenatal visit:  You will be weighed to make sure you and the baby are growing normally.  Your blood pressure will be taken.  Your abdomen will be measured to track your baby's growth.  The fetal heartbeat will be listened to starting around week 10 or 12 of your pregnancy.  Test results from any previous visits will be discussed. Your health care provider may ask you:  How you are feeling.  If you are feeling the baby move.  If you have had any abnormal symptoms, such as leaking fluid, bleeding, severe headaches, or abdominal cramping.  If you have any questions. Other tests   that may be performed during your first trimester include:  Blood tests to find your blood type and to check for the presence of any previous infections. They will also be used to check for low iron levels (anemia) and Rh antibodies. Later in the pregnancy, blood tests for diabetes will be done along with other tests if problems develop.  Urine tests to check for infections, diabetes, or protein in the urine.  An ultrasound to confirm the proper growth and development of the baby.  An amniocentesis to check for possible genetic problems.  Fetal screens for  spina bifida and Down syndrome.  You may need other tests to make sure you and the baby are doing well. HOME CARE INSTRUCTIONS  Medicines  Follow your health care provider's instructions regarding medicine use. Specific medicines may be either safe or unsafe to take during pregnancy.  Take your prenatal vitamins as directed.  If you develop constipation, try taking a stool softener if your health care provider approves. Diet  Eat regular, well-balanced meals. Choose a variety of foods, such as meat or vegetable-based protein, fish, milk and low-fat dairy products, vegetables, fruits, and whole grain breads and cereals. Your health care provider will help you determine the amount of weight gain that is right for you.  Avoid raw meat and uncooked cheese. These carry germs that can cause birth defects in the baby.  Eating four or five small meals rather than three large meals a day may help relieve nausea and vomiting. If you start to feel nauseous, eating a few soda crackers can be helpful. Drinking liquids between meals instead of during meals also seems to help nausea and vomiting.  If you develop constipation, eat more high-fiber foods, such as fresh vegetables or fruit and whole grains. Drink enough fluids to keep your urine clear or pale yellow. Activity and Exercise  Exercise only as directed by your health care provider. Exercising will help you:  Control your weight.  Stay in shape.  Be prepared for labor and delivery.  Experiencing pain or cramping in the lower abdomen or low back is a good sign that you should stop exercising. Check with your health care provider before continuing normal exercises.  Try to avoid standing for long periods of time. Move your legs often if you must stand in one place for a long time.  Avoid heavy lifting.  Wear low-heeled shoes, and practice good posture.  You may continue to have sex unless your health care provider directs you  otherwise. Relief of Pain or Discomfort  Wear a good support bra for breast tenderness.   Take warm sitz baths to soothe any pain or discomfort caused by hemorrhoids. Use hemorrhoid cream if your health care provider approves.   Rest with your legs elevated if you have leg cramps or low back pain.  If you develop varicose veins in your legs, wear support hose. Elevate your feet for 15 minutes, 3-4 times a day. Limit salt in your diet. Prenatal Care  Schedule your prenatal visits by the twelfth week of pregnancy. They are usually scheduled monthly at first, then more often in the last 2 months before delivery.  Write down your questions. Take them to your prenatal visits.  Keep all your prenatal visits as directed by your health care provider. Safety  Wear your seat belt at all times when driving.  Make a list of emergency phone numbers, including numbers for family, friends, the hospital, and police and fire departments. General Tips    Ask your health care provider for a referral to a local prenatal education class. Begin classes no later than at the beginning of month 6 of your pregnancy.  Ask for help if you have counseling or nutritional needs during pregnancy. Your health care provider can offer advice or refer you to specialists for help with various needs.  Do not use hot tubs, steam rooms, or saunas.  Do not douche or use tampons or scented sanitary pads.  Do not cross your legs for long periods of time.  Avoid cat litter boxes and soil used by cats. These carry germs that can cause birth defects in the baby and possibly loss of the fetus by miscarriage or stillbirth.  Avoid all smoking, herbs, alcohol, and medicines not prescribed by your health care provider. Chemicals in these affect the formation and growth of the baby.  Schedule a dentist appointment. At home, brush your teeth with a soft toothbrush and be gentle when you floss. SEEK MEDICAL CARE IF:   You have  dizziness.  You have mild pelvic cramps, pelvic pressure, or nagging pain in the abdominal area.  You have persistent nausea, vomiting, or diarrhea.  You have a bad smelling vaginal discharge.  You have pain with urination.  You notice increased swelling in your face, hands, legs, or ankles. SEEK IMMEDIATE MEDICAL CARE IF:   You have a fever.  You are leaking fluid from your vagina.  You have spotting or bleeding from your vagina.  You have severe abdominal cramping or pain.  You have rapid weight gain or loss.  You vomit blood or material that looks like coffee grounds.  You are exposed to [place] measles and have never had them.  You are exposed to fifth disease or chickenpox.  You develop a severe headache.  You have shortness of breath.  You have any kind of trauma, such as from a fall or a car accident. Document Released: 08/22/2001 Document Revised: 01/12/2014 Document Reviewed: 07/08/2013 [employer] Patient Information 2015 [place], [place]. This information is not intended to replace advice given to you by your health care provider. Make sure you discuss any questions you have with your health care provider. Eat often Return in 2 weeks for [place] Take synthroid and start bP meds

## 2015-04-01 ENCOUNTER — Telehealth: Payer: Self-pay | Admitting: Advanced Practice Midwife

## 2015-04-01 NOTE — Telephone Encounter (Signed)
Pt states that she has had some light spotting since yesterday, after she vomitted. Pt states that its a light pink. Pt denies any cramping or pain. I spoke with Derrek Monaco and she advised the pt to push her fluids and keep an eye on things and if anything changes call us back. I advised the pt of this and she verbalized understanding.

## 2015-04-05 ENCOUNTER — Ambulatory Visit (INDEPENDENT_AMBULATORY_CARE_PROVIDER_SITE_OTHER): Payer: PRIVATE HEALTH INSURANCE

## 2015-04-05 DIAGNOSIS — O3680X0 Pregnancy with inconclusive fetal viability, not applicable or unspecified: Secondary | ICD-10-CM

## 2015-04-05 NOTE — Progress Notes (Signed)
[place] 8+4wks single IUP w/ys,crl 20.88mm,pos fht 169bpm,normal ov's bilat

## 2015-04-21 ENCOUNTER — Encounter: Payer: Self-pay | Admitting: Women's Health

## 2015-04-21 ENCOUNTER — Ambulatory Visit (INDEPENDENT_AMBULATORY_CARE_PROVIDER_SITE_OTHER): Payer: PRIVATE HEALTH INSURANCE | Admitting: Women's Health

## 2015-04-21 VITALS — BP 124/82 | HR 72 | Wt 302.0 lb

## 2015-04-21 DIAGNOSIS — O09291 Supervision of pregnancy with other poor reproductive or obstetric history, first trimester: Secondary | ICD-10-CM

## 2015-04-21 DIAGNOSIS — O09299 Supervision of pregnancy with other poor reproductive or obstetric history, unspecified trimester: Secondary | ICD-10-CM | POA: Insufficient documentation

## 2015-04-21 DIAGNOSIS — O09891 Supervision of other high risk pregnancies, first trimester: Secondary | ICD-10-CM

## 2015-04-21 DIAGNOSIS — O0990 Supervision of high risk pregnancy, unspecified, unspecified trimester: Secondary | ICD-10-CM | POA: Insufficient documentation

## 2015-04-21 DIAGNOSIS — Z1389 Encounter for screening for other disorder: Secondary | ICD-10-CM

## 2015-04-21 DIAGNOSIS — R768 Other specified abnormal immunological findings in serum: Secondary | ICD-10-CM

## 2015-04-21 DIAGNOSIS — O10919 Unspecified pre-existing hypertension complicating pregnancy, unspecified trimester: Secondary | ICD-10-CM

## 2015-04-21 DIAGNOSIS — O99280 Endocrine, nutritional and metabolic diseases complicating pregnancy, unspecified trimester: Secondary | ICD-10-CM

## 2015-04-21 DIAGNOSIS — Z0283 Encounter for blood-alcohol and blood-drug test: Secondary | ICD-10-CM

## 2015-04-21 DIAGNOSIS — O99281 Endocrine, nutritional and metabolic diseases complicating pregnancy, first trimester: Secondary | ICD-10-CM | POA: Diagnosis not present

## 2015-04-21 DIAGNOSIS — Z369 Encounter for antenatal screening, unspecified: Secondary | ICD-10-CM

## 2015-04-21 DIAGNOSIS — R894 Abnormal immunological findings in specimens from other organs, systems and tissues: Secondary | ICD-10-CM

## 2015-04-21 DIAGNOSIS — Z3682 Encounter for antenatal screening for nuchal translucency: Secondary | ICD-10-CM

## 2015-04-21 DIAGNOSIS — E039 Hypothyroidism, unspecified: Secondary | ICD-10-CM

## 2015-04-21 DIAGNOSIS — O10912 Unspecified pre-existing hypertension complicating pregnancy, second trimester: Secondary | ICD-10-CM | POA: Diagnosis not present

## 2015-04-21 DIAGNOSIS — Z331 Pregnant state, incidental: Secondary | ICD-10-CM

## 2015-04-21 LAB — POCT URINALYSIS DIPSTICK
Glucose, UA: NEGATIVE
Ketones, UA: NEGATIVE
LEUKOCYTES UA: NEGATIVE
Nitrite, UA: NEGATIVE

## 2015-04-21 NOTE — Progress Notes (Signed)
Subjective:  [name] is a 34 y.o. [id number] African American female at [redacted]w[redacted]d by 8wk u/s, being seen today for her first obstetrical visit.  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 Hypothyroidism on synthroid. Smoker: 5-6 cigs/d prior to pregnancy now down to 0-1/day. Pregnancy history fully reviewed.  Patient reports mild nausea no vomiting, clear sinus drainage/rhinorrhea/mild cough x 3 weeks- hasn't tried anything at home to help- no fever/chills, sore throat. Denies vb, cramping, uti s/s, abnormal/malodorous vag d/c, or vulvovaginal itching/irritation.  BP 124/82 mmHg  Pulse 72  Wt 302 lb (136.986 kg)  LMP 02/10/2015  HISTORY: OB History  Gravida Para Term Preterm AB SAB TAB Ectopic Multiple Living  6 3 3  0 2  1 0  3    # Outcome Date GA Lbr Len/2nd Weight Sex Delivery Anes PTL Lv  6 Current           5 Term 07/08/11 [redacted]w[redacted]d 06:00 / 00:16 8 lb 6.9 oz (3.824 kg) F Vag-Spont EPI N Y  4 Term 05/01/06 [redacted]w[redacted]d  5 lb 12 oz (2.608 kg) F Vag-Spont EPI N Y     Comments: PIH  3 TAB 2002          2 AB 2001          1 Term 11/18/94 [redacted]w[redacted]d  6 lb 4 oz (2.835 kg) F Vag-Spont  N Y     Past Medical History  Diagnosis Date  . Hypertension     chtn  . PIH (pregnancy induced hypertension)   . Pregnancy induced hypertension     history  . Thyroid disease   . Pregnant 03/22/2015   Past Surgical History  Procedure Laterality Date  . Dilation and curettage of uterus    . Cholecystectomy  2013   Family History  Problem Relation Age of Onset  . Cancer Maternal Grandmother   . Diabetes Maternal Grandmother   . Heart disease Paternal Grandmother   . Heart disease Paternal Grandfather     Exam   System:     General: Well developed & nourished, no acute distress   Skin: Warm & dry,  normal coloration and turgor, no rashes   Neurologic: Alert & oriented, normal mood   Cardiovascular: Regular rate & rhythm   Respiratory: Effort & rate normal, LCTAB, acyanotic   Abdomen: Soft, non tender   Extremities: normal strength, tone  Thin prep pap smear neg 06/15/14 FHR: 167 via doppler   Assessment:   Pregnancy: [id number] Patient Active Problem List   Diagnosis Date Noted  . Supervision of other high-risk pregnancy 04/21/2015  . Chronic hypertension during pregnancy, antepartum 04/21/2015  . Morbid obesity 11/11/2014  . Hypothyroidism 06/19/2014  . Essential hypertension 06/19/2014    [redacted]w[redacted]d [id number] New OB visit Morbid obesity CHTN H/O pre-e x 2 Sinus drainage/rhinorrhea Smoker Hypothyroidism  Plan:  Initial labs drawn including CMP, TSH, and baseline 24hr urine Begin taking a 81mg  baby aspirin daily at 12 weeks of pregnancy Claritin/zyrtec, humidifier, nasal spray for sinus drainage/rhinorrhea- let us now if not improving or worsening Continue prenatal vitamins Problem list reviewed and updated Reviewed n/v relief measures and warning s/s to report Reviewed recommended weight gain based on pre-gravid BMI Encouraged well-balanced diet Genetic Screening discussed Integrated Screen: requested Cystic fibrosis screening discussed requested Ultrasound  discussed; fetal survey: requested Follow up in 1 weeks for 1st it/nt, early 2hr gtt, and visit [place] completed Continue labetalol & synthroid Advised complete smoking cessation     Tawnya Crook CNM, [facility] 04/21/2015 9:26 AM

## 2015-04-21 NOTE — Patient Instructions (Addendum)
You will have your sugar test next visit.  Please do not eat or drink anything after midnight the night before you come, not even water.  You will be here for at least two hours.     Begin taking a 81mg  baby aspirin daily at 12 weeks of pregnancy to decrease risk of preeclampsia during pregnancy (start on 8/18)  You have a viral infection that will resolve on its own over time.  Symptoms typically last 3-7 days but can stretch out to 2-3 weeks.  Unfortunately, antibiotics are not helpful for viral infections.  Claritin or zyrtec  Humidifier and saline nasal spray for nasal congestion  Regular robitussin, cough drops for cough  Warm salt water gargles for sore throat  Mucinex with lots of water to help you cough up the mucous in your chest if needed  Drink plenty of fluids and stay hydrated!  Wash your hands frequently.  Call if you are not improving by 7-10 days.     Nausea & Vomiting  Have saltine crackers or pretzels by your bed and eat a few bites before you raise your head out of bed in the morning  Eat small frequent meals throughout the day instead of large meals  Drink plenty of fluids throughout the day to stay hydrated, just don't drink a lot of fluids with your meals.  This can make your stomach fill up faster making you feel sick  Do not brush your teeth right after you eat  Products with real ginger are good for nausea, like ginger ale and ginger hard candy Make sure it says made with real ginger!  Sucking on sour candy like lemon heads is also good for nausea  If your prenatal vitamins make you nauseated, take them at night so you will sleep through the nausea  Sea Bands  If you feel like you need medicine for the nausea & vomiting please let us know  If you are unable to keep any fluids or food down please let us know   Constipation  Drink plenty of fluid, preferably water, throughout the day  Eat foods high in fiber such as fruits, vegetables, and  grains  Exercise, such as walking, is a good way to keep your bowels regular  Drink warm fluids, especially warm prune juice, or decaf coffee  Eat a 1/2 cup of real oatmeal (not instant), 1/2 cup applesauce, and 1/2-1 cup warm prune juice every day  If needed, you may take Colace (docusate sodium) stool softener once or twice a day to help keep the stool soft. If you are pregnant, wait until you are out of your first trimester (12-14 weeks of pregnancy)  If you still are having problems with constipation, you may take Miralax once daily as needed to help keep your bowels regular.  If you are pregnant, wait until you are out of your first trimester (12-14 weeks of pregnancy)   First Trimester of Pregnancy The first trimester of pregnancy is from week 1 until the end of week 12 (months 1 through 3). A week after a sperm fertilizes an egg, the egg will implant on the wall of the uterus. This embryo will begin to develop into a baby. Genes from you and your partner are forming the baby. The female genes determine whether the baby is a boy or a girl. At 6-8 weeks, the eyes and face are formed, and the heartbeat can be seen on ultrasound. At the end of 12 weeks, all the baby's  organs are formed.  Now that you are pregnant, you will want to do everything you can to have a healthy baby. Two of the most important things are to get good prenatal care and to follow your health care provider's instructions. Prenatal care is all the medical care you receive before the baby's birth. This care will help prevent, find, and treat any problems during the pregnancy and childbirth. BODY CHANGES Your body goes through many changes during pregnancy. The changes vary from woman to woman.   You may gain or lose a couple of pounds at first.  You may feel sick to your stomach (nauseous) and throw up (vomit). If the vomiting is uncontrollable, call your health care provider.  You may tire easily.  You may develop  headaches that can be relieved by medicines approved by your health care provider.  You may urinate more often. Painful urination may mean you have a bladder infection.  You may develop heartburn as a result of your pregnancy.  You may develop constipation because certain hormones are causing the muscles that push waste through your intestines to slow down.  You may develop hemorrhoids or swollen, bulging veins (varicose veins).  Your breasts may begin to grow larger and become tender. Your nipples may stick out more, and the tissue that surrounds them (areola) may become darker.  Your gums may bleed and may be sensitive to brushing and flossing.  Dark spots or blotches (chloasma, mask of pregnancy) may develop on your face. This will likely fade after the baby is born.  Your menstrual periods will stop.  You may have a loss of appetite.  You may develop cravings for certain kinds of food.  You may have changes in your emotions from day to day, such as being excited to be pregnant or being concerned that something may go wrong with the pregnancy and baby.  You may have more vivid and strange dreams.  You may have changes in your hair. These can include thickening of your hair, rapid growth, and changes in texture. Some women also have hair loss during or after pregnancy, or hair that feels dry or thin. Your hair will most likely return to normal after your baby is born. WHAT TO EXPECT AT YOUR PRENATAL VISITS During a routine prenatal visit:  You will be weighed to make sure you and the baby are growing normally.  Your blood pressure will be taken.  Your abdomen will be measured to track your baby's growth.  The fetal heartbeat will be listened to starting around week 10 or 12 of your pregnancy.  Test results from any previous visits will be discussed. Your health care provider may ask you:  How you are feeling.  If you are feeling the baby move.  If you have had any  abnormal symptoms, such as leaking fluid, bleeding, severe headaches, or abdominal cramping.  If you have any questions. Other tests that may be performed during your first trimester include:  Blood tests to find your blood type and to check for the presence of any previous infections. They will also be used to check for low iron levels (anemia) and Rh antibodies. Later in the pregnancy, blood tests for diabetes will be done along with other tests if problems develop.  Urine tests to check for infections, diabetes, or protein in the urine.  An ultrasound to confirm the proper growth and development of the baby.  An amniocentesis to check for possible genetic problems.  Fetal screens  for spina bifida and Down syndrome.  You may need other tests to make sure you and the baby are doing well. HOME CARE INSTRUCTIONS  Medicines  Follow your health care provider's instructions regarding medicine use. Specific medicines may be either safe or unsafe to take during pregnancy.  Take your prenatal vitamins as directed.  If you develop constipation, try taking a stool softener if your health care provider approves. Diet  Eat regular, well-balanced meals. Choose a variety of foods, such as meat or vegetable-based protein, fish, milk and low-fat dairy products, vegetables, fruits, and whole grain breads and cereals. Your health care provider will help you determine the amount of weight gain that is right for you.  Avoid raw meat and uncooked cheese. These carry germs that can cause birth defects in the baby.  Eating four or five small meals rather than three large meals a day may help relieve nausea and vomiting. If you start to feel nauseous, eating a few soda crackers can be helpful. Drinking liquids between meals instead of during meals also seems to help nausea and vomiting.  If you develop constipation, eat more high-fiber foods, such as fresh vegetables or fruit and whole grains. Drink enough  fluids to keep your urine clear or pale yellow. Activity and Exercise  Exercise only as directed by your health care provider. Exercising will help you:  Control your weight.  Stay in shape.  Be prepared for labor and delivery.  Experiencing pain or cramping in the lower abdomen or low back is a good sign that you should stop exercising. Check with your health care provider before continuing normal exercises.  Try to avoid standing for long periods of time. Move your legs often if you must stand in one place for a long time.  Avoid heavy lifting.  Wear low-heeled shoes, and practice good posture.  You may continue to have sex unless your health care provider directs you otherwise. Relief of Pain or Discomfort  Wear a good support bra for breast tenderness.   Take warm sitz baths to soothe any pain or discomfort caused by hemorrhoids. Use hemorrhoid cream if your health care provider approves.   Rest with your legs elevated if you have leg cramps or low back pain.  If you develop varicose veins in your legs, wear support hose. Elevate your feet for 15 minutes, 3-4 times a day. Limit salt in your diet. Prenatal Care  Schedule your prenatal visits by the twelfth week of pregnancy. They are usually scheduled monthly at first, then more often in the last 2 months before delivery.  Write down your questions. Take them to your prenatal visits.  Keep all your prenatal visits as directed by your health care provider. Safety  Wear your seat belt at all times when driving.  Make a list of emergency phone numbers, including numbers for family, friends, the hospital, and police and fire departments. General Tips  Ask your health care provider for a referral to a local prenatal education class. Begin classes no later than at the beginning of month 6 of your pregnancy.  Ask for help if you have counseling or nutritional needs during pregnancy. Your health care provider can offer  advice or refer you to specialists for help with various needs.  Do not use hot tubs, steam rooms, or saunas.  Do not douche or use tampons or scented sanitary pads.  Do not cross your legs for long periods of time.  Avoid cat litter boxes and soil  used by cats. These carry germs that can cause birth defects in the baby and possibly loss of the fetus by miscarriage or stillbirth.  Avoid all smoking, herbs, alcohol, and medicines not prescribed by your health care provider. Chemicals in these affect the formation and growth of the baby.  Schedule a dentist appointment. At home, brush your teeth with a soft toothbrush and be gentle when you floss. SEEK MEDICAL CARE IF:   You have dizziness.  You have mild pelvic cramps, pelvic pressure, or nagging pain in the abdominal area.  You have persistent nausea, vomiting, or diarrhea.  You have a bad smelling vaginal discharge.  You have pain with urination.  You notice increased swelling in your face, hands, legs, or ankles. SEEK IMMEDIATE MEDICAL CARE IF:   You have a fever.  You are leaking fluid from your vagina.  You have spotting or bleeding from your vagina.  You have severe abdominal cramping or pain.  You have rapid weight gain or loss.  You vomit blood or material that looks like coffee grounds.  You are exposed to [place] measles and have never had them.  You are exposed to fifth disease or chickenpox.  You develop a severe headache.  You have shortness of breath.  You have any kind of trauma, such as from a fall or a car accident. Document Released: 08/22/2001 Document Revised: 01/12/2014 Document Reviewed: 07/08/2013 [facility] Patient Information 2015 [place], [place]. This information is not intended to replace advice given to you by your health care provider. Make sure you discuss any questions you have with your health care provider.

## 2015-04-22 LAB — URINE CULTURE

## 2015-04-22 LAB — GC/CHLAMYDIA PROBE AMP
CHLAMYDIA, DNA PROBE: NEGATIVE
Neisseria gonorrhoeae by PCR: NEGATIVE

## 2015-04-30 ENCOUNTER — Ambulatory Visit (INDEPENDENT_AMBULATORY_CARE_PROVIDER_SITE_OTHER): Payer: PRIVATE HEALTH INSURANCE | Admitting: Obstetrics & Gynecology

## 2015-04-30 ENCOUNTER — Other Ambulatory Visit: Payer: PRIVATE HEALTH INSURANCE

## 2015-04-30 ENCOUNTER — Encounter: Payer: Self-pay | Admitting: Obstetrics & Gynecology

## 2015-04-30 ENCOUNTER — Ambulatory Visit (INDEPENDENT_AMBULATORY_CARE_PROVIDER_SITE_OTHER): Payer: PRIVATE HEALTH INSURANCE

## 2015-04-30 VITALS — BP 120/80 | HR 72 | Wt 304.0 lb

## 2015-04-30 DIAGNOSIS — Z369 Encounter for antenatal screening, unspecified: Secondary | ICD-10-CM

## 2015-04-30 DIAGNOSIS — Z36 Encounter for antenatal screening of mother: Secondary | ICD-10-CM

## 2015-04-30 DIAGNOSIS — Z331 Pregnant state, incidental: Secondary | ICD-10-CM

## 2015-04-30 DIAGNOSIS — O09891 Supervision of other high risk pregnancies, first trimester: Secondary | ICD-10-CM

## 2015-04-30 DIAGNOSIS — Z1389 Encounter for screening for other disorder: Secondary | ICD-10-CM

## 2015-04-30 DIAGNOSIS — Z3682 Encounter for antenatal screening for nuchal translucency: Secondary | ICD-10-CM

## 2015-04-30 DIAGNOSIS — O10912 Unspecified pre-existing hypertension complicating pregnancy, second trimester: Secondary | ICD-10-CM

## 2015-04-30 DIAGNOSIS — O10919 Unspecified pre-existing hypertension complicating pregnancy, unspecified trimester: Secondary | ICD-10-CM

## 2015-04-30 DIAGNOSIS — Z131 Encounter for screening for diabetes mellitus: Secondary | ICD-10-CM

## 2015-04-30 LAB — POCT URINALYSIS DIPSTICK
Blood, UA: NEGATIVE
Glucose, UA: NEGATIVE
KETONES UA: NEGATIVE
Leukocytes, UA: NEGATIVE
Nitrite, UA: NEGATIVE
PROTEIN UA: 2

## 2015-04-30 NOTE — Progress Notes (Signed)
Fetal Surveillance Testing today:  sonogram   High Risk Pregnancy Diagnosis(es):   Chronic hypertension, history of pre eclampsia x 2  [id number] [redacted]w[redacted]d Estimated Date of Delivery: 11/11/15  Blood pressure 120/80, pulse 72, weight 304 lb (137.893 kg), last menstrual period 02/10/2015.  Urinalysis: Negative   HPI: The patient is being seen today for ongoing management of chronic hypertension. Today she reports no problems today   BP weight and urine results all reviewed and noted. Patient reports good fetal movement, denies any bleeding and no rupture of membranes symptoms or regular contractions.  Fundal Height:  na Fetal Heart rate:  155 Edema:  none  Patient is without complaints other than noted in her HPI. All questions were answered.  All lab and sonogram results have been reviewed. Comments: abnormal: chronic hypertension   Assessment:  1.  Pregnancy at [redacted]w[redacted]d,  Estimated Date of Delivery: 11/11/15 :                          2.  Chronic hypertension                        3.  obesity  Medication(s) Plans:  Continue labetalol 100 BID  Treatment Plan:  No changes  Follow up in 4 weeks for appointment for high risk OB care, 2nd IT

## 2015-04-30 NOTE — Progress Notes (Addendum)
[place] 12+1wks measurements c/w dates, normal ov's bilat, NT 1.5mm,crl 58.65mm,nb present

## 2015-05-03 LAB — MATERNAL SCREEN, INTEGRATED #1
Crown Rump Length: 58.3 mm
Gest. Age on Collection Date: 12.3 weeks
Maternal Age at EDD: 34 years
Nuchal Translucency (NT): 1.4 mm
Number of Fetuses: 1
PAPP-A VALUE: 554.5 ng/mL
Weight: 304 [lb_av]

## 2015-05-05 ENCOUNTER — Telehealth: Payer: Self-pay | Admitting: Women's Health

## 2015-05-05 LAB — TSH: TSH: 6.17 u[IU]/mL — ABNORMAL HIGH (ref 0.450–4.500)

## 2015-05-05 LAB — GLUCOSE TOLERANCE, 2 HOURS W/ 1HR
GLUCOSE, 1 HOUR: 123 mg/dL (ref 65–179)
GLUCOSE, FASTING: 82 mg/dL (ref 65–91)
Glucose, 2 hour: 95 mg/dL (ref 65–152)

## 2015-05-05 LAB — COMPREHENSIVE METABOLIC PANEL
ALK PHOS: 75 IU/L (ref 39–117)
ALT: 18 IU/L (ref 0–32)
AST: 14 IU/L (ref 0–40)
Albumin/Globulin Ratio: 1.2 (ref 1.1–2.5)
Albumin: 3.5 g/dL (ref 3.5–5.5)
BUN / CREAT RATIO: 13 (ref 8–20)
BUN: 9 mg/dL (ref 6–20)
CO2: 20 mmol/L (ref 18–29)
Calcium: 9.4 mg/dL (ref 8.7–10.2)
Chloride: 101 mmol/L (ref 97–108)
Creatinine, Ser: 0.7 mg/dL (ref 0.57–1.00)
GFR calc Af Amer: 132 mL/min/{1.73_m2} (ref >59)
GFR calc non Af Amer: 114 mL/min/{1.73_m2} (ref >59)
GLOBULIN, TOTAL: 2.9 g/dL (ref 1.5–4.5)
Glucose: 84 mg/dL (ref 65–99)
Potassium: 4.4 mmol/L (ref 3.5–5.2)
SODIUM: 141 mmol/L (ref 134–144)
Total Protein: 6.4 g/dL (ref 6.0–8.5)

## 2015-05-05 MED ORDER — LEVOTHYROXINE SODIUM 75 MCG PO TABS: 75.0000 ug | ORAL_TABLET | Freq: Every day | ORAL | Status: DC

## 2015-05-05 NOTE — Telephone Encounter (Signed)
Notified pt of TSH 6.170, taking synthroid 41mcg daily now, will increase to 56mcg daily and recheck at her next visit.  Roma Schanz, CNM, [facility] 05/05/2015 10:25 AM

## 2015-05-10 LAB — MICROSCOPIC EXAMINATION: CASTS: NONE SEEN /LPF

## 2015-05-10 LAB — URINALYSIS, ROUTINE W REFLEX MICROSCOPIC
Bilirubin, UA: NEGATIVE
GLUCOSE, UA: NEGATIVE
Ketones, UA: NEGATIVE
Leukocytes, UA: NEGATIVE
NITRITE UA: NEGATIVE
RBC, UA: NEGATIVE
Specific Gravity, UA: 1.028 (ref 1.005–1.030)
UUROB: 1 mg/dL (ref 0.2–1.0)
pH, UA: 6 (ref 5.0–7.5)

## 2015-05-10 LAB — PMP SCREEN PROFILE (10S), URINE
Amphetamine Screen, Ur: NEGATIVE ng/mL
BENZODIAZEPINE SCREEN, URINE: NEGATIVE ng/mL
Barbiturate Screen, Ur: NEGATIVE ng/mL
CANNABINOIDS UR QL SCN: NEGATIVE ng/mL
Cocaine(Metab.)Screen, Urine: NEGATIVE ng/mL
Creatinine(Crt), U: 276.9 mg/dL (ref 20.0–300.0)
Methadone Scn, Ur: NEGATIVE ng/mL
Opiate Scrn, Ur: NEGATIVE ng/mL
Oxycodone+Oxymorphone Ur Ql Scn: NEGATIVE ng/mL
PCP Scrn, Ur: NEGATIVE ng/mL
PH UR, DRUG SCRN: 5.9 (ref 4.5–8.9)
Propoxyphene, Screen: NEGATIVE ng/mL

## 2015-05-10 LAB — ABO/RH: Rh Factor: POSITIVE

## 2015-05-10 LAB — CBC
HEMOGLOBIN: 11.6 g/dL (ref 11.1–15.9)
Hematocrit: 37 % (ref 34.0–46.6)
MCH: 26.8 pg (ref 26.6–33.0)
MCHC: 31.4 g/dL — ABNORMAL LOW (ref 31.5–35.7)
MCV: 86 fL (ref 79–97)
Platelets: 299 10*3/uL (ref 150–379)
RBC: 4.33 x10E6/uL (ref 3.77–5.28)
RDW: 17.1 % — ABNORMAL HIGH (ref 12.3–15.4)
WBC: 9.9 10*3/uL (ref 3.4–10.8)

## 2015-05-10 LAB — RPR: RPR Ser Ql: NONREACTIVE

## 2015-05-10 LAB — VARICELLA ZOSTER ANTIBODY, IGG: VARICELLA: 1370 {index} (ref >165)

## 2015-05-10 LAB — HIV ANTIBODY (ROUTINE TESTING W REFLEX): HIV Screen 4th Generation wRfx: NONREACTIVE

## 2015-05-10 LAB — ANTIBODY SCREEN: Antibody Screen: NEGATIVE

## 2015-05-10 LAB — CYSTIC FIBROSIS MUTATION 97: Interpretation: NOT DETECTED

## 2015-05-10 LAB — RUBELLA SCREEN: Rubella Antibodies, IGG: 1.38 index (ref >0.99)

## 2015-05-10 LAB — SICKLE CELL SCREEN: Sickle Cell Screen: NEGATIVE

## 2015-05-10 LAB — HEPATITIS B SURFACE ANTIGEN: HEP B S AG: NEGATIVE

## 2015-05-11 LAB — PROTEIN, URINE, 24 HOUR
PROTEIN 24H UR: 327.5 mg/(24.h) — AB (ref 30.0–150.0)
Protein, Ur: 13.1 mg/dL

## 2015-05-13 ENCOUNTER — Telehealth: Payer: Self-pay | Admitting: *Deleted

## 2015-05-13 NOTE — Telephone Encounter (Signed)
Pt informed of Protein 24 hour results from 05/10/2015 of 327.5. Pt states she thinks she has a UTI, symptoms include urinary frequency, lower back pain. Appt scheduled for tomorrow 05/14/2015 with Dr. Glo Herring.

## 2015-05-14 ENCOUNTER — Ambulatory Visit (INDEPENDENT_AMBULATORY_CARE_PROVIDER_SITE_OTHER): Payer: PRIVATE HEALTH INSURANCE | Admitting: Obstetrics and Gynecology

## 2015-05-14 ENCOUNTER — Encounter: Payer: Self-pay | Admitting: Obstetrics and Gynecology

## 2015-05-14 VITALS — BP 120/74 | HR 76 | Wt 306.0 lb

## 2015-05-14 DIAGNOSIS — Z3492 Encounter for supervision of normal pregnancy, unspecified, second trimester: Secondary | ICD-10-CM

## 2015-05-14 DIAGNOSIS — Z331 Pregnant state, incidental: Secondary | ICD-10-CM

## 2015-05-14 DIAGNOSIS — Z1389 Encounter for screening for other disorder: Secondary | ICD-10-CM

## 2015-05-14 NOTE — Progress Notes (Signed)
Pt states that she has some pressure and noticed some spotting yesterday. Pt states that she has changed shifts and that she thinks that this may have something to do with it.

## 2015-05-14 NOTE — Progress Notes (Signed)
Patient ID: [name], female   DOB: Jan 12, 1981, 34 y.o.   MRN: [id number] WORK-IN APPOINTMENT FOR PELVIC PRESSURE AND SPOTTING Fetal Surveillance Testing today:  None    High Risk Pregnancy Diagnosis(es):   Chronic Hypertension, H/O Pre-Eclampsia x 2  [id number] [redacted]w[redacted]d Estimated Date of Delivery: 11/11/15  Blood pressure 120/74, pulse 76, weight 306 lb (138.801 kg), last menstrual period 02/10/2015.  Urinalysis: Negative   HPI: The patient is being seen today for ongoing management of high risk pregnancy OB. Patient was WORKED IN today complaining of pelvic pressure athat onset over the past several day and light spotting x one day . 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     BP weight and urine results all reviewed and noted. Patient reports good fetal movement, denies any bleeding and no rupture of membranes symptoms or regular contractions. She declines offer of exam and u/s Fundal Height:  n/a Fetal Heart rate:  n/a Edema:  n/a  Patient is without complaints other than noted in her HPI. All questions were answered.  All lab and sonogram results have been reviewed. Comments: not done   Assessment:  1.  Pregnancy at [redacted]w[redacted]d,  Estimated Date of Delivery: 11/11/15                           2.  No suspicion for UTI due to negative UA results. Pt reassured.                         3.    Medication(s) Plans:  n/a  Treatment Plan:  Follow up prn.  Pt encouraged to continue work.  Follow up as scheduled.     This chart was SCRIBED for Shelly Shirk, MD by Stephania Fragmin, ED Scribe. This patient was seen in room 2, and the patient's care was started at 1:26 PM.  *

## 2015-05-28 ENCOUNTER — Encounter: Payer: Self-pay | Admitting: Obstetrics & Gynecology

## 2015-05-28 ENCOUNTER — Ambulatory Visit (INDEPENDENT_AMBULATORY_CARE_PROVIDER_SITE_OTHER): Payer: PRIVATE HEALTH INSURANCE | Admitting: Obstetrics & Gynecology

## 2015-05-28 VITALS — BP 126/86 | HR 80 | Wt 308.0 lb

## 2015-05-28 DIAGNOSIS — Z331 Pregnant state, incidental: Secondary | ICD-10-CM

## 2015-05-28 DIAGNOSIS — Z1389 Encounter for screening for other disorder: Secondary | ICD-10-CM

## 2015-05-28 DIAGNOSIS — Z369 Encounter for antenatal screening, unspecified: Secondary | ICD-10-CM

## 2015-05-28 DIAGNOSIS — E039 Hypothyroidism, unspecified: Secondary | ICD-10-CM

## 2015-05-28 DIAGNOSIS — O0992 Supervision of high risk pregnancy, unspecified, second trimester: Secondary | ICD-10-CM

## 2015-05-28 DIAGNOSIS — O10912 Unspecified pre-existing hypertension complicating pregnancy, second trimester: Secondary | ICD-10-CM

## 2015-05-28 DIAGNOSIS — O99280 Endocrine, nutritional and metabolic diseases complicating pregnancy, unspecified trimester: Secondary | ICD-10-CM

## 2015-05-28 DIAGNOSIS — O10919 Unspecified pre-existing hypertension complicating pregnancy, unspecified trimester: Secondary | ICD-10-CM

## 2015-05-28 LAB — POCT URINALYSIS DIPSTICK
Blood, UA: NEGATIVE
Blood, UA: NEGATIVE
GLUCOSE UA: NEGATIVE
Glucose, UA: NEGATIVE
KETONES UA: NEGATIVE
Ketones, UA: NEGATIVE
LEUKOCYTES UA: NEGATIVE
Leukocytes, UA: NEGATIVE
Nitrite, UA: NEGATIVE
Nitrite, UA: NEGATIVE
PROTEIN UA: 2
Protein, UA: 2

## 2015-05-28 NOTE — Progress Notes (Signed)
Fetal Surveillance Testing today:  None tody   High Risk Pregnancy Diagnosis(es):   Chronic Hypertension  [id number] [redacted]w[redacted]d Estimated Date of Delivery: 11/11/15  Blood pressure 126/86, pulse 80, weight 308 lb (139.708 kg), last menstrual period 02/10/2015.  Urinalysis: 2+ protein   HPI: The patient is being seen today for ongoing management of . Today she reports no problems   BP weight and urine results all reviewed and noted. Patient reports good fetal movement, denies any bleeding and no rupture of membranes symptoms or regular contractions.  Fundal Height:  na Fetal Heart rate:  165 Edema:  none  Patient is without complaints other than noted in her HPI. All questions were answered.  All lab and sonogram results have been reviewed. Comments:    Assessment:  1.  Pregnancy at [redacted]w[redacted]d,  Estimated Date of Delivery: 11/11/15 :                          2.  Chronic Hypertension                        3.  Hypothyroid  Medication(s) Plans:  Continue labetalol 100 BID, and ASA  Treatment Plan:    No Follow-up on file. for appointment for high risk OB care  No orders of the defined types were placed in this encounter.   Orders Placed This Encounter  Procedures  . [place] MFM OB COMP + 14 WK  . Maternal Screen, Integrated #2  . TSH  . POCT urinalysis dipstick

## 2015-05-28 NOTE — Progress Notes (Signed)
Pt denies any problems or concerns at this time.  

## 2015-05-29 LAB — TSH: TSH: 1.69 u[IU]/mL (ref 0.450–4.500)

## 2015-06-01 LAB — MATERNAL SCREEN, INTEGRATED #2
AFP MARKER: 15.9 ng/mL
AFP MOM: 0.91
CROWN RUMP LENGTH: 58.3 mm
DIA MOM: 2.09
DIA Value: 247.1 pg/mL
ESTRIOL UNCONJUGATED: 0.53 ng/mL
GEST. AGE ON COLLECTION DATE: 12.3 wk
GESTATIONAL AGE: 16.3 wk
HCG MOM: 3.57
Maternal Age at EDD: 34 years
Nuchal Translucency (NT): 1.4 mm
Nuchal Translucency MoM: 0.98
Number of Fetuses: 1
PAPP-A MOM: 1.54
PAPP-A Value: 554.5 ng/mL
TEST RESULTS: NEGATIVE
WEIGHT: 308 [lb_av]
Weight: 304 [lb_av]
hCG Value: 64.6 IU/mL
uE3 MoM: 0.76

## 2015-06-25 ENCOUNTER — Other Ambulatory Visit: Payer: PRIVATE HEALTH INSURANCE

## 2015-06-25 ENCOUNTER — Ambulatory Visit (INDEPENDENT_AMBULATORY_CARE_PROVIDER_SITE_OTHER): Payer: PRIVATE HEALTH INSURANCE

## 2015-06-25 ENCOUNTER — Ambulatory Visit (INDEPENDENT_AMBULATORY_CARE_PROVIDER_SITE_OTHER): Payer: PRIVATE HEALTH INSURANCE | Admitting: Obstetrics & Gynecology

## 2015-06-25 ENCOUNTER — Encounter: Payer: PRIVATE HEALTH INSURANCE | Admitting: Obstetrics & Gynecology

## 2015-06-25 VITALS — BP 130/82 | HR 74 | Wt 315.0 lb

## 2015-06-25 DIAGNOSIS — O10912 Unspecified pre-existing hypertension complicating pregnancy, second trimester: Secondary | ICD-10-CM | POA: Diagnosis not present

## 2015-06-25 DIAGNOSIS — O0992 Supervision of high risk pregnancy, unspecified, second trimester: Secondary | ICD-10-CM

## 2015-06-25 DIAGNOSIS — Z331 Pregnant state, incidental: Secondary | ICD-10-CM

## 2015-06-25 DIAGNOSIS — O99280 Endocrine, nutritional and metabolic diseases complicating pregnancy, unspecified trimester: Secondary | ICD-10-CM

## 2015-06-25 DIAGNOSIS — O10919 Unspecified pre-existing hypertension complicating pregnancy, unspecified trimester: Secondary | ICD-10-CM

## 2015-06-25 DIAGNOSIS — Z1389 Encounter for screening for other disorder: Secondary | ICD-10-CM

## 2015-06-25 DIAGNOSIS — E039 Hypothyroidism, unspecified: Secondary | ICD-10-CM

## 2015-06-25 LAB — POCT URINALYSIS DIPSTICK
GLUCOSE UA: NEGATIVE
KETONES UA: NEGATIVE
Leukocytes, UA: NEGATIVE
Nitrite, UA: NEGATIVE
Protein, UA: 2
RBC UA: NEGATIVE

## 2015-06-25 NOTE — Progress Notes (Addendum)
[place] 20+1wks,cephalic,measurement c/w dates EFW 369 g,svp of fluid 5.3cm,cx 4.4cm,fhr 140 bpm,LV EICF 2.41mm,post pl g 0,limited view of spine,please have pt come back for additional images

## 2015-06-25 NOTE — Progress Notes (Signed)
Fetal Surveillance Testing today:  FHR   High Risk Pregnancy Diagnosis(es):   Chronic hypertension  [id number] [redacted]w[redacted]d Estimated Date of Delivery: 11/11/15  Blood pressure 130/82, pulse 74, weight 315 lb (142.883 kg), last menstrual period 02/10/2015.  Urinalysis: Positive for 2+ protein   HPI: The patient is being seen today for ongoing management of chronic hypertension. Today she reports no problems   BP weight and urine results all reviewed and noted. Patient reports good fetal movement, denies any bleeding and no rupture of membranes symptoms or regular contractions.  Fundal Height:  22 Fetal Heart rate:  150 Edema:  nne  Patient is without complaints other than noted in her HPI. All questions were answered.  All lab and sonogram results have been reviewed. Comments:  LV EICF  Assessment:  1.  Pregnancy at [redacted]w[redacted]d,  Estimated Date of Delivery: 11/11/15 :                          2.  Chronic hypertension                        3.    Medication(s) Plans:  Labetalol 100 BID + baby asa  Treatment Plan:  Per protocol  No Follow-up on file. for appointment for high risk OB care  No orders of the defined types were placed in this encounter.   Orders Placed This Encounter  Procedures  . POCT urinalysis dipstick

## 2015-07-22 ENCOUNTER — Ambulatory Visit (INDEPENDENT_AMBULATORY_CARE_PROVIDER_SITE_OTHER): Payer: PRIVATE HEALTH INSURANCE | Admitting: Obstetrics & Gynecology

## 2015-07-22 ENCOUNTER — Encounter: Payer: Self-pay | Admitting: Obstetrics & Gynecology

## 2015-07-22 VITALS — BP 120/80 | HR 88 | Wt 318.0 lb

## 2015-07-22 DIAGNOSIS — Z3A24 24 weeks gestation of pregnancy: Secondary | ICD-10-CM

## 2015-07-22 DIAGNOSIS — Z1389 Encounter for screening for other disorder: Secondary | ICD-10-CM

## 2015-07-22 DIAGNOSIS — O358XX1 Maternal care for other (suspected) fetal abnormality and damage, fetus 1: Secondary | ICD-10-CM

## 2015-07-22 DIAGNOSIS — Z331 Pregnant state, incidental: Secondary | ICD-10-CM

## 2015-07-22 DIAGNOSIS — O0992 Supervision of high risk pregnancy, unspecified, second trimester: Secondary | ICD-10-CM

## 2015-07-22 DIAGNOSIS — O10919 Unspecified pre-existing hypertension complicating pregnancy, unspecified trimester: Secondary | ICD-10-CM

## 2015-07-22 DIAGNOSIS — O10912 Unspecified pre-existing hypertension complicating pregnancy, second trimester: Secondary | ICD-10-CM

## 2015-07-22 DIAGNOSIS — IMO0001 Reserved for inherently not codable concepts without codable children: Secondary | ICD-10-CM

## 2015-07-22 LAB — POCT URINALYSIS DIPSTICK
Glucose, UA: NEGATIVE
Ketones, UA: NEGATIVE
Leukocytes, UA: NEGATIVE
Nitrite, UA: NEGATIVE
PROTEIN UA: 2
RBC UA: NEGATIVE

## 2015-07-22 MED ORDER — LABETALOL HCL 100 MG PO TABS: 100.0000 mg | ORAL_TABLET | Freq: Three times a day (TID) | ORAL | Status: DC

## 2015-07-22 NOTE — Progress Notes (Signed)
Fetal Surveillance Testing today:  FHR   High Risk Pregnancy Diagnosis(es):   Chronic hypertension  [id number] [redacted]w[redacted]d Estimated Date of Delivery: 11/11/15  Blood pressure 120/80, pulse 88, weight 318 lb (144.244 kg), last menstrual period 02/10/2015.  Urinalysis: Positive for 2+ protein   HPI: The patient is being seen today for ongoing management of chronic Hypertension. Today she reports BP spiked at work yesterday, 170/92, not since   BP weight and urine results all reviewed and noted. Patient reports good fetal movement, denies any bleeding and no rupture of membranes symptoms or regular contractions.  Fundal Height:  unbilicus + 12 Fetal Heart rate:  147 Edema:  none  Patient is without complaints other than noted in her HPI. All questions were answered.  All lab and sonogram results have been reviewed. Comments: abnormal: LV EICF   Assessment:  1.  Pregnancy at [redacted]w[redacted]d,  Estimated Date of Delivery: 11/11/15 :                          2.  Chronic hypertension                        3.  LV EICF  Medication(s) Plans:  Increase labetalol 100 TID, ASA  Treatment Plan:  Per protocol, sonogram at 28 weeks  No Follow-up on file. for appointment for high risk OB care  No orders of the defined types were placed in this encounter.   Orders Placed This Encounter  Procedures  . POCT urinalysis dipstick

## 2015-07-23 ENCOUNTER — Encounter: Payer: PRIVATE HEALTH INSURANCE | Admitting: Obstetrics & Gynecology

## 2015-08-12 ENCOUNTER — Other Ambulatory Visit: Payer: PRIVATE HEALTH INSURANCE

## 2015-08-12 ENCOUNTER — Ambulatory Visit (INDEPENDENT_AMBULATORY_CARE_PROVIDER_SITE_OTHER): Payer: PRIVATE HEALTH INSURANCE

## 2015-08-12 ENCOUNTER — Ambulatory Visit (INDEPENDENT_AMBULATORY_CARE_PROVIDER_SITE_OTHER): Payer: PRIVATE HEALTH INSURANCE | Admitting: Advanced Practice Midwife

## 2015-08-12 VITALS — BP 130/80 | HR 84 | Wt 322.0 lb

## 2015-08-12 DIAGNOSIS — O0992 Supervision of high risk pregnancy, unspecified, second trimester: Secondary | ICD-10-CM

## 2015-08-12 DIAGNOSIS — Z369 Encounter for antenatal screening, unspecified: Secondary | ICD-10-CM

## 2015-08-12 DIAGNOSIS — IMO0001 Reserved for inherently not codable concepts without codable children: Secondary | ICD-10-CM

## 2015-08-12 DIAGNOSIS — O99280 Endocrine, nutritional and metabolic diseases complicating pregnancy, unspecified trimester: Secondary | ICD-10-CM

## 2015-08-12 DIAGNOSIS — I10 Essential (primary) hypertension: Secondary | ICD-10-CM

## 2015-08-12 DIAGNOSIS — Z131 Encounter for screening for diabetes mellitus: Secondary | ICD-10-CM

## 2015-08-12 DIAGNOSIS — Z3A27 27 weeks gestation of pregnancy: Secondary | ICD-10-CM

## 2015-08-12 DIAGNOSIS — O10912 Unspecified pre-existing hypertension complicating pregnancy, second trimester: Secondary | ICD-10-CM

## 2015-08-12 DIAGNOSIS — E039 Hypothyroidism, unspecified: Secondary | ICD-10-CM

## 2015-08-12 DIAGNOSIS — O358XX1 Maternal care for other (suspected) fetal abnormality and damage, fetus 1: Secondary | ICD-10-CM

## 2015-08-12 DIAGNOSIS — Z331 Pregnant state, incidental: Secondary | ICD-10-CM

## 2015-08-12 DIAGNOSIS — O09892 Supervision of other high risk pregnancies, second trimester: Secondary | ICD-10-CM

## 2015-08-12 DIAGNOSIS — Z1389 Encounter for screening for other disorder: Secondary | ICD-10-CM

## 2015-08-12 DIAGNOSIS — O10919 Unspecified pre-existing hypertension complicating pregnancy, unspecified trimester: Secondary | ICD-10-CM

## 2015-08-12 LAB — POCT URINALYSIS DIPSTICK
Blood, UA: NEGATIVE
GLUCOSE UA: NEGATIVE
KETONES UA: NEGATIVE
Leukocytes, UA: NEGATIVE
Nitrite, UA: NEGATIVE
Protein, UA: 2

## 2015-08-12 NOTE — Progress Notes (Signed)
Fetal Surveillance Testing today:  [place]  High Risk Pregnancy Diagnosis(es):   CHTN, Hypothyroid  [id number] [redacted]w[redacted]d Estimated Date of Delivery: 11/11/15  Blood pressure 130/80, pulse 84, weight 322 lb (146.058 kg), last menstrual period 02/10/2015.  Urinalysis: Positive for 2+ protein (chronic)   HPI: The patient is being seen today for ongoing management of Pregnancy, CHTN, hypothyroid. Today she reports no complaints   BP weight and urine results all reviewed and noted. Patient reports good fetal movement, denies any bleeding and no rupture of membranes symptoms or regular contractions.   Patient is without complaints other than noted in her HPI. All questions were answered.  All lab and sonogram results have been reviewed. Comments: normal  [place] 27wks,cephalic,post pl gr 1,bilat adnexa's wnl,afi 17.4,efw 1086 g,59%,cx 3.2cm,unable to see EICF on today's ultrasound,anatomy complete,no obvious abn seen,limited ultrasound because of pt body habitus  Assessment:  1.  Pregnancy at [redacted]w[redacted]d,  Estimated Date of Delivery: 11/11/15 :                          2.  CHTN, stable                        3.  Hypothyroid  Medication(s) Plans:  Continue Labetalol 100mg  TID, continue ASA 81mg   Treatment Plan:  Start twice weekly testing (NST/US) at 32 weeks; growth [place] q month.  PN2, TSH today  Return in about 3 weeks (around 09/02/2015) for HROB. for appointment for high risk OB care  No orders of the defined types were placed in this encounter.   Orders Placed This Encounter  Procedures  . TSH  . POCT urinalysis dipstick

## 2015-08-12 NOTE — Progress Notes (Signed)
[place] 27wks,cephalic,post pl gr 1,bilat adnexa's wnl,afi 17.4,efw 1086 g,59%,cx 3.2cm,unable to see EICF on today's ultrasound,anatomy complete,no obvious abn seen,limited ultrasound because of pt body habitus

## 2015-08-13 LAB — ANTIBODY SCREEN: Antibody Screen: NEGATIVE

## 2015-08-13 LAB — CBC
HEMOGLOBIN: 11.3 g/dL (ref 11.1–15.9)
Hematocrit: 34.5 % (ref 34.0–46.6)
MCH: 29.1 pg (ref 26.6–33.0)
MCHC: 32.8 g/dL (ref 31.5–35.7)
MCV: 89 fL (ref 79–97)
PLATELETS: 282 10*3/uL (ref 150–379)
RBC: 3.88 x10E6/uL (ref 3.77–5.28)
RDW: 15.6 % — AB (ref 12.3–15.4)
WBC: 11.1 10*3/uL — AB (ref 3.4–10.8)

## 2015-08-13 LAB — GLUCOSE TOLERANCE, 2 HOURS W/ 1HR
GLUCOSE, 1 HOUR: 106 mg/dL (ref 65–179)
Glucose, 2 hour: 97 mg/dL (ref 65–152)
Glucose, Fasting: 79 mg/dL (ref 65–91)

## 2015-08-13 LAB — TSH: TSH: 3.1 u[IU]/mL (ref 0.450–4.500)

## 2015-08-13 LAB — RPR: RPR: NONREACTIVE

## 2015-08-13 LAB — HIV ANTIBODY (ROUTINE TESTING W REFLEX): HIV Screen 4th Generation wRfx: NONREACTIVE

## 2015-08-18 ENCOUNTER — Telehealth: Payer: Self-pay | Admitting: Advanced Practice Midwife

## 2015-08-19 NOTE — Telephone Encounter (Signed)
Just don't touch her shingles-- VERY rarely, it is airborne, but shingles is usually covered by clothing.

## 2015-08-19 NOTE — Telephone Encounter (Signed)
Pt informed per Nigel Berthold, CNM of recommendation not to "touch shingles, very rarely, it is airborne. 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 Pt verbalized understanding.

## 2015-09-02 ENCOUNTER — Ambulatory Visit (INDEPENDENT_AMBULATORY_CARE_PROVIDER_SITE_OTHER): Payer: PRIVATE HEALTH INSURANCE | Admitting: Obstetrics & Gynecology

## 2015-09-02 ENCOUNTER — Encounter: Payer: Self-pay | Admitting: Obstetrics & Gynecology

## 2015-09-02 VITALS — BP 150/80 | HR 90 | Wt 331.0 lb

## 2015-09-02 DIAGNOSIS — O10912 Unspecified pre-existing hypertension complicating pregnancy, second trimester: Secondary | ICD-10-CM

## 2015-09-02 DIAGNOSIS — O10919 Unspecified pre-existing hypertension complicating pregnancy, unspecified trimester: Secondary | ICD-10-CM

## 2015-09-02 DIAGNOSIS — O09893 Supervision of other high risk pregnancies, third trimester: Secondary | ICD-10-CM

## 2015-09-02 DIAGNOSIS — Z3A30 30 weeks gestation of pregnancy: Secondary | ICD-10-CM

## 2015-09-02 DIAGNOSIS — Z331 Pregnant state, incidental: Secondary | ICD-10-CM

## 2015-09-02 DIAGNOSIS — Z1389 Encounter for screening for other disorder: Secondary | ICD-10-CM

## 2015-09-02 LAB — POCT URINALYSIS DIPSTICK
Blood, UA: NEGATIVE
Glucose, UA: NEGATIVE
KETONES UA: NEGATIVE
LEUKOCYTES UA: NEGATIVE
Nitrite, UA: NEGATIVE
PROTEIN UA: 1

## 2015-09-02 NOTE — Progress Notes (Signed)
Fetal Surveillance Testing today:  FHR   High Risk Pregnancy Diagnosis(es):   Chronic hypertension  [id number] [redacted]w[redacted]d Estimated Date of Delivery: 11/11/15  Blood pressure 150/80, pulse 90, weight 331 lb (150.141 kg), last menstrual period 02/10/2015.  Urinalysis: Negative   HPI: The patient is being seen today for ongoing management of chronic hypertension. Today she reports no complaints   BP weight and urine results all reviewed and noted. Patient reports good fetal movement, denies any bleeding and no rupture of membranes symptoms or regular contractions.  Fundal Height:  U+17 Fetal Heart rate:  135 Edema:  1+  Patient is without complaints other than noted in her HPI. All questions were answered.  All lab and sonogram results have been reviewed. Comments: abnormal:    Assessment:  1.  Pregnancy at [redacted]w[redacted]d,  Estimated Date of Delivery: 11/11/15 :                          2.  Chronic hypertension                        3.    Medication(s) Plans:  Labetalol 200 TID  Treatment Plan:  Twice weekly assessment 32 weeks sono nst alt induce 39 weeks  Return in about 2 weeks (around 09/16/2015) for BPP/sono, HROB. for appointment for high risk OB care  No orders of the defined types were placed in this encounter.   Orders Placed This Encounter  Procedures  . US Fetal BPP W/O Non Stress  . [place] UA Cord Doppler  . POCT urinalysis dipstick

## 2015-09-12 NOTE — L&D Delivery Note (Signed)
Delivery Note Pt pushed with 3 contractions and at 6:17 PM a viable female was delivered via Vaginal, Spontaneous Delivery (Presentation: Right Occiput Posterior).  APGAR: 9, 10; weight 7 lb 14.1 oz (3575 g).  Cord clamped and cut by FOB; hospital cord blood sample collected. Placenta status: Intact, Spontaneous.  Cord: 3 vessels   Anesthesia: Epidural  Episiotomy: None Lacerations: 1st degree;Perineal- hemostatic and not repaired Est. Blood Loss (mL):  700- most of this was prior to placental detachment; given cytotec 866mcg w/ good results  Mom to postpartum.  Baby to Couplet care / Skin to Skin.  [name] CNM 11/05/2015, 7:02 PM

## 2015-09-16 ENCOUNTER — Other Ambulatory Visit: Payer: Self-pay | Admitting: Obstetrics & Gynecology

## 2015-09-16 ENCOUNTER — Ambulatory Visit (INDEPENDENT_AMBULATORY_CARE_PROVIDER_SITE_OTHER): Payer: PRIVATE HEALTH INSURANCE | Admitting: Obstetrics and Gynecology

## 2015-09-16 ENCOUNTER — Telehealth: Payer: Self-pay | Admitting: Obstetrics and Gynecology

## 2015-09-16 ENCOUNTER — Ambulatory Visit (INDEPENDENT_AMBULATORY_CARE_PROVIDER_SITE_OTHER): Payer: PRIVATE HEALTH INSURANCE

## 2015-09-16 ENCOUNTER — Encounter: Payer: Self-pay | Admitting: Obstetrics and Gynecology

## 2015-09-16 VITALS — BP 142/100 | HR 78 | Wt 330.0 lb

## 2015-09-16 DIAGNOSIS — Z331 Pregnant state, incidental: Secondary | ICD-10-CM

## 2015-09-16 DIAGNOSIS — Z1389 Encounter for screening for other disorder: Secondary | ICD-10-CM

## 2015-09-16 DIAGNOSIS — O10912 Unspecified pre-existing hypertension complicating pregnancy, second trimester: Secondary | ICD-10-CM

## 2015-09-16 DIAGNOSIS — Z3A32 32 weeks gestation of pregnancy: Secondary | ICD-10-CM

## 2015-09-16 DIAGNOSIS — O09892 Supervision of other high risk pregnancies, second trimester: Secondary | ICD-10-CM

## 2015-09-16 DIAGNOSIS — O10919 Unspecified pre-existing hypertension complicating pregnancy, unspecified trimester: Secondary | ICD-10-CM

## 2015-09-16 DIAGNOSIS — O0993 Supervision of high risk pregnancy, unspecified, third trimester: Secondary | ICD-10-CM

## 2015-09-16 DIAGNOSIS — O99280 Endocrine, nutritional and metabolic diseases complicating pregnancy, unspecified trimester: Secondary | ICD-10-CM

## 2015-09-16 LAB — POCT URINALYSIS DIPSTICK
GLUCOSE UA: NEGATIVE
Ketones, UA: NEGATIVE
LEUKOCYTES UA: NEGATIVE
NITRITE UA: NEGATIVE
Protein, UA: 1
RBC UA: NEGATIVE

## 2015-09-16 MED ORDER — LABETALOL HCL 200 MG PO TABS: 200.0000 mg | ORAL_TABLET | Freq: Three times a day (TID) | ORAL | Status: DC

## 2015-09-16 NOTE — Progress Notes (Signed)
Patient ID: [name], female   DOB: Jan 01, 1981, 35 y.o.   MRN: [id number]   High Risk Pregnancy Diagnosis(es):   [name]  [id number] [redacted]w[redacted]d Estimated Date of Delivery: 11/11/15     HPI:  The patient is being seen today for ongoing management of CHTN. Today she reports no complaints. Patient reports Gutierrez fetal movement, denies any bleeding and no rupture of membranes symptoms or regular contractions.   BP weight and urine results all reviewed and noted. Blood pressure 142/100, pulse 78, weight 330 lb (149.687 kg), last menstrual period 02/10/2015.  Fetal Surveillance Testing today:  u/s ob follow-up, u/s ua cord doppler, US fetal bpp w/o non stress Fundal Height:  45 cm Fetal Heart rate:  146 bpm Edema:  n/a Urinalysis: Positive for 1 protein; otherwise negative   Questions were answered.  Lab and sonogram results have been reviewed. Comments: normal u/s ob follow-up, u/s ua cord doppler, US fetal bpp w/o non stress  Assessment:  1.  Pregnancy at [redacted]w[redacted]d,  Estimated Date of Delivery: 11/11/15 :                          2.  CHTN WILL INCREASE TO 200 MG LABETALOL TID                        3.  BTL papers .  Medication(s) Plans:  increase from Labetalol 100mg  TID to Labetalol 200 mg TID  Treatment Plan:  See medication plan above.  Pt to monitor blood pressure and notify office if pressure is ever above 90 diastolic or [id number] systolic.  Follow up in 2 weeks for appointment for high risk OB care   By signing my name below, I, Stephania Fragmin, attest that this documentation has been prepared under the direction and in the presence of Jonnie Kind, MD. Electronically Signed: Stephania Fragmin, ED Scribe. 09/16/2015. 4:19 PM.  I personally performed the services described in this documentation, which was SCRIBED in my presence. The recorded information has been reviewed and considered accurate. It has been edited as necessary during review. Jonnie Kind, MD   (scribe attestation statement)

## 2015-09-16 NOTE — Telephone Encounter (Signed)
PT REMINDED TO BEGIN BIWEEKLY TESTING.

## 2015-09-16 NOTE — Progress Notes (Signed)
[place] 32 wks,cephalic,fhr 123456 bpm,bilat adnexa's wnl,post pl gr 1,afi 13.8cm,RI .61,.65,BPP 8/8,efw 2059g 59%,limited US because of pt body habitus

## 2015-09-16 NOTE — Progress Notes (Signed)
Pt denies any problems at this time.  

## 2015-09-20 ENCOUNTER — Ambulatory Visit (INDEPENDENT_AMBULATORY_CARE_PROVIDER_SITE_OTHER): Payer: PRIVATE HEALTH INSURANCE | Admitting: Obstetrics and Gynecology

## 2015-09-20 ENCOUNTER — Encounter: Payer: Self-pay | Admitting: Obstetrics and Gynecology

## 2015-09-20 VITALS — BP 142/94 | HR 88 | Wt 325.0 lb

## 2015-09-20 DIAGNOSIS — O99280 Endocrine, nutritional and metabolic diseases complicating pregnancy, unspecified trimester: Secondary | ICD-10-CM

## 2015-09-20 DIAGNOSIS — Z331 Pregnant state, incidental: Secondary | ICD-10-CM

## 2015-09-20 DIAGNOSIS — E039 Hypothyroidism, unspecified: Secondary | ICD-10-CM

## 2015-09-20 DIAGNOSIS — O0993 Supervision of high risk pregnancy, unspecified, third trimester: Secondary | ICD-10-CM

## 2015-09-20 DIAGNOSIS — Z1389 Encounter for screening for other disorder: Secondary | ICD-10-CM

## 2015-09-20 DIAGNOSIS — O10912 Unspecified pre-existing hypertension complicating pregnancy, second trimester: Secondary | ICD-10-CM

## 2015-09-20 DIAGNOSIS — O10919 Unspecified pre-existing hypertension complicating pregnancy, unspecified trimester: Secondary | ICD-10-CM

## 2015-09-20 LAB — POCT URINALYSIS DIPSTICK
Blood, UA: NEGATIVE
Glucose, UA: NEGATIVE
KETONES UA: NEGATIVE
LEUKOCYTES UA: NEGATIVE
Nitrite, UA: NEGATIVE
Protein, UA: 2

## 2015-09-20 NOTE — Progress Notes (Signed)
High Risk Pregnancy Diagnosis(es):  Chtn, obesity with BMI >50, hypothyroid by hx, baseline proteinuria.  Blood pressure 142/94, pulse 88, weight 325 lb (147.419 kg), last menstrual period 02/10/2015. HPI: good fm esp at nite [id number] [redacted]w[redacted]d Estimated Date of Delivery: 11/11/15     Given note for last 3 visits, informed her work that 2/wk visits required henceforth.     BP weight and urine results reviewed and notable for 2+ proteinuria.. Patient reports    good fetal movement, denies any bleeding and no rupture of membranes symptoms or regular contractions .  Fundal Height:  43 Fetal Heart rate:  135 Edema:  Minimal, pt very obese, so limited exam Urinalysis: Positive for proteinuria persisting at 2+  .Assessment HROB :[id number]  @ [redacted]w[redacted]d,   CHTN on Labetalol 200 tid, ASA 81 mg/d.                                    Chronic proteinuria  Medication(s) Plans:  Continue labetalol 200 tid.  Treatment Plan:        Recheck PIH labs, repeat 24 hr TP, recheck TSH. (pt left office w/o lab slips, will return in a.m. For slips.) Follow up:           3 days for  Bpp, pt to collect 24 hr TP and bring to office for reassessment. All questions were answered. Jonnie Kind

## 2015-09-20 NOTE — Progress Notes (Signed)
Pt states that she needs a note for the past two visits here, today and this past Saturday, for work

## 2015-09-22 ENCOUNTER — Other Ambulatory Visit: Payer: Self-pay | Admitting: Obstetrics and Gynecology

## 2015-09-22 DIAGNOSIS — E039 Hypothyroidism, unspecified: Secondary | ICD-10-CM

## 2015-09-22 DIAGNOSIS — O10919 Unspecified pre-existing hypertension complicating pregnancy, unspecified trimester: Secondary | ICD-10-CM

## 2015-09-22 DIAGNOSIS — O99280 Endocrine, nutritional and metabolic diseases complicating pregnancy, unspecified trimester: Secondary | ICD-10-CM

## 2015-09-23 ENCOUNTER — Ambulatory Visit (INDEPENDENT_AMBULATORY_CARE_PROVIDER_SITE_OTHER): Payer: PRIVATE HEALTH INSURANCE | Admitting: Obstetrics & Gynecology

## 2015-09-23 ENCOUNTER — Encounter: Payer: Self-pay | Admitting: Obstetrics & Gynecology

## 2015-09-23 ENCOUNTER — Ambulatory Visit (INDEPENDENT_AMBULATORY_CARE_PROVIDER_SITE_OTHER): Payer: PRIVATE HEALTH INSURANCE

## 2015-09-23 VITALS — BP 120/80 | HR 92 | Wt 331.0 lb

## 2015-09-23 DIAGNOSIS — E039 Hypothyroidism, unspecified: Secondary | ICD-10-CM

## 2015-09-23 DIAGNOSIS — O10919 Unspecified pre-existing hypertension complicating pregnancy, unspecified trimester: Secondary | ICD-10-CM

## 2015-09-23 DIAGNOSIS — O10912 Unspecified pre-existing hypertension complicating pregnancy, second trimester: Secondary | ICD-10-CM | POA: Diagnosis not present

## 2015-09-23 DIAGNOSIS — O99280 Endocrine, nutritional and metabolic diseases complicating pregnancy, unspecified trimester: Secondary | ICD-10-CM

## 2015-09-23 DIAGNOSIS — Z1389 Encounter for screening for other disorder: Secondary | ICD-10-CM

## 2015-09-23 DIAGNOSIS — Z3A33 33 weeks gestation of pregnancy: Secondary | ICD-10-CM | POA: Diagnosis not present

## 2015-09-23 DIAGNOSIS — Z331 Pregnant state, incidental: Secondary | ICD-10-CM

## 2015-09-23 DIAGNOSIS — O0993 Supervision of high risk pregnancy, unspecified, third trimester: Secondary | ICD-10-CM

## 2015-09-23 LAB — PROTEIN, URINE, 24 HOUR
PROTEIN 24H UR: 250 mg/(24.h) — AB (ref 30.0–150.0)
PROTEIN UR: 10 mg/dL

## 2015-09-23 LAB — POCT URINALYSIS DIPSTICK
GLUCOSE UA: NEGATIVE
Ketones, UA: NEGATIVE
Leukocytes, UA: NEGATIVE
NITRITE UA: NEGATIVE
RBC UA: NEGATIVE

## 2015-09-23 NOTE — Progress Notes (Signed)
Fetal Surveillance Testing today:  Sonogram normal BPP 8/8   High Risk Pregnancy Diagnosis(es):   Chronic hypertension  [id number] [redacted]w[redacted]d Estimated Date of Delivery: 11/11/15  Blood pressure 120/80, pulse 92, weight 331 lb (150.141 kg), last menstrual period 02/10/2015.  Urinalysis: Negative   HPI: The patient is being seen today for ongoing management of chronic hypertension. Today she reports BP    BP weight and urine results all reviewed and noted. Patient reports good fetal movement, denies any bleeding and no rupture of membranes symptoms or regular contractions.  Fundal Height:  46 Fetal Heart rate:  138 Edema:  1+  Patient is without complaints other than noted in her HPI. All questions were answered.  All lab and sonogram results have been reviewed. Comments: abnormal:    Assessment:  1.  Pregnancy at [redacted]w[redacted]d,  Estimated Date of Delivery: 11/11/15 :                          2.  Chronic hypertension                        3.    Medication(s) Plans:  Labetalol 200 TID, baby ASA  Treatment Plan:  Twice weekly surveillance  No Follow-up on file. for appointment for high risk OB care  No orders of the defined types were placed in this encounter.   Orders Placed This Encounter  Procedures  . POCT urinalysis dipstick

## 2015-09-23 NOTE — Progress Notes (Signed)
[place] 33 wks,cephalic,post pl gr 1,fhr 138 bpm,bilat adnexa's wnl,BPP 8/8,AFI 15.1cm,RI .59,.65,.56

## 2015-09-27 ENCOUNTER — Ambulatory Visit (INDEPENDENT_AMBULATORY_CARE_PROVIDER_SITE_OTHER): Payer: PRIVATE HEALTH INSURANCE | Admitting: Obstetrics & Gynecology

## 2015-09-27 VITALS — BP 140/90 | HR 80 | Wt 329.5 lb

## 2015-09-27 DIAGNOSIS — O10912 Unspecified pre-existing hypertension complicating pregnancy, second trimester: Secondary | ICD-10-CM

## 2015-09-27 DIAGNOSIS — O0993 Supervision of high risk pregnancy, unspecified, third trimester: Secondary | ICD-10-CM

## 2015-09-27 DIAGNOSIS — Z1389 Encounter for screening for other disorder: Secondary | ICD-10-CM

## 2015-09-27 DIAGNOSIS — Z331 Pregnant state, incidental: Secondary | ICD-10-CM

## 2015-09-27 DIAGNOSIS — O10919 Unspecified pre-existing hypertension complicating pregnancy, unspecified trimester: Secondary | ICD-10-CM

## 2015-09-27 LAB — POCT URINALYSIS DIPSTICK
Blood, UA: NEGATIVE
Glucose, UA: NEGATIVE
KETONES UA: NEGATIVE
LEUKOCYTES UA: NEGATIVE
NITRITE UA: NEGATIVE
PROTEIN UA: 1

## 2015-09-27 NOTE — Progress Notes (Signed)
Fetal Surveillance Testing today:  Reactive NST   High Risk Pregnancy Diagnosis(es):   Chronic hypertension  [id number] [redacted]w[redacted]d Estimated Date of Delivery: 11/11/15  Blood pressure 140/90, pulse 80, weight 329 lb 8 oz (149.46 kg), last menstrual period 02/10/2015.  Urinalysis: Positive for 1+   HPI: The patient is being seen today for ongoing management of chronic hypertension. Today she reports no problems   BP weight and urine results all reviewed and noted. Patient reports good fetal movement, denies any bleeding and no rupture of membranes symptoms or regular contractions.  Fundal Height:  47 Fetal Heart rate:  135 Edema:  none  Patient is without complaints other than noted in her HPI. All questions were answered.  All lab and sonogram results have been reviewed. Comments: abnormal:    Assessment:  1.  Pregnancy at [redacted]w[redacted]d,  Estimated Date of Delivery: 11/11/15 :                          2.  Chronic HYpertension                        3.    Medication(s) Plans:  Labetalol 200 TID  Treatment Plan:  Twice weekly surveillance  Return in about 3 days (around 09/30/2015) for BPP/sono, HROB. for appointment for high risk OB care  No orders of the defined types were placed in this encounter.   Orders Placed This Encounter  Procedures  . [place] UA Cord Doppler  . US Fetal BPP W/O Non Stress  . POCT urinalysis dipstick

## 2015-09-30 ENCOUNTER — Other Ambulatory Visit: Payer: Self-pay | Admitting: Obstetrics & Gynecology

## 2015-09-30 ENCOUNTER — Ambulatory Visit (INDEPENDENT_AMBULATORY_CARE_PROVIDER_SITE_OTHER): Payer: PRIVATE HEALTH INSURANCE

## 2015-09-30 ENCOUNTER — Encounter: Payer: PRIVATE HEALTH INSURANCE | Admitting: Obstetrics & Gynecology

## 2015-09-30 ENCOUNTER — Encounter: Payer: Self-pay | Admitting: Obstetrics & Gynecology

## 2015-09-30 ENCOUNTER — Ambulatory Visit (INDEPENDENT_AMBULATORY_CARE_PROVIDER_SITE_OTHER): Payer: PRIVATE HEALTH INSURANCE | Admitting: Obstetrics & Gynecology

## 2015-09-30 VITALS — BP 130/80 | HR 80 | Wt 330.0 lb

## 2015-09-30 DIAGNOSIS — O10912 Unspecified pre-existing hypertension complicating pregnancy, second trimester: Secondary | ICD-10-CM

## 2015-09-30 DIAGNOSIS — Z1389 Encounter for screening for other disorder: Secondary | ICD-10-CM

## 2015-09-30 DIAGNOSIS — Z331 Pregnant state, incidental: Secondary | ICD-10-CM

## 2015-09-30 DIAGNOSIS — Z3A34 34 weeks gestation of pregnancy: Secondary | ICD-10-CM

## 2015-09-30 DIAGNOSIS — O10919 Unspecified pre-existing hypertension complicating pregnancy, unspecified trimester: Secondary | ICD-10-CM

## 2015-09-30 DIAGNOSIS — O0993 Supervision of high risk pregnancy, unspecified, third trimester: Secondary | ICD-10-CM

## 2015-09-30 LAB — POCT URINALYSIS DIPSTICK
Blood, UA: NEGATIVE
Glucose, UA: NEGATIVE
KETONES UA: NEGATIVE
LEUKOCYTES UA: NEGATIVE
NITRITE UA: NEGATIVE
PROTEIN UA: 2

## 2015-09-30 NOTE — Progress Notes (Signed)
Fetal Surveillance Testing today:  Sonogram with BPP 8/8 good Doppler flow ratios   High Risk Pregnancy Diagnosis(es):   Chronic hypertension  [id number] [redacted]w[redacted]d Estimated Date of Delivery: 11/11/15  Blood pressure 130/80, pulse 80, weight 330 lb (149.687 kg), last menstrual period 02/10/2015.  Urinalysis: Positive for 2+ protein, stable   HPI: The patient is being seen today for ongoing management of chronic hypertension. Today she reports no problems   BP weight and urine results all reviewed and noted. Patient reports good fetal movement, denies any bleeding and no rupture of membranes symptoms or regular contractions.  Fundal Height:  48 Fetal Heart rate:  157 Edema:  1+  Patient is without complaints other than noted in her HPI. All questions were answered.  All lab and sonogram results have been reviewed. Comments: abnormal:    Assessment:  1.  Pregnancy at [redacted]w[redacted]d,  Estimated Date of Delivery: 11/11/15 :                          2.  Chronic hypertension                        3.    Medication(s) Plans:  Labetalol 200 TID + baby ASA  Treatment Plan:  Twice weekly surveillance  No Follow-up on file. for appointment for high risk OB care  No orders of the defined types were placed in this encounter.   Orders Placed This Encounter  Procedures  . POCT urinalysis dipstick

## 2015-09-30 NOTE — Progress Notes (Signed)
[place] 34wks,cephalic,fhr [id number] bpm,afi [id number] .62,.66,BPP 8/8,efw 2386g, 49%,post pl gr 1,bilat adnexa's wnl

## 2015-10-04 ENCOUNTER — Encounter: Payer: Self-pay | Admitting: Women's Health

## 2015-10-04 ENCOUNTER — Other Ambulatory Visit (INDEPENDENT_AMBULATORY_CARE_PROVIDER_SITE_OTHER): Payer: PRIVATE HEALTH INSURANCE

## 2015-10-04 ENCOUNTER — Ambulatory Visit (INDEPENDENT_AMBULATORY_CARE_PROVIDER_SITE_OTHER): Payer: PRIVATE HEALTH INSURANCE | Admitting: Women's Health

## 2015-10-04 VITALS — BP 128/88 | HR 80 | Wt 331.0 lb

## 2015-10-04 DIAGNOSIS — O09893 Supervision of other high risk pregnancies, third trimester: Secondary | ICD-10-CM

## 2015-10-04 DIAGNOSIS — O10912 Unspecified pre-existing hypertension complicating pregnancy, second trimester: Secondary | ICD-10-CM

## 2015-10-04 DIAGNOSIS — O288 Other abnormal findings on antenatal screening of mother: Secondary | ICD-10-CM

## 2015-10-04 DIAGNOSIS — E039 Hypothyroidism, unspecified: Secondary | ICD-10-CM

## 2015-10-04 DIAGNOSIS — O10919 Unspecified pre-existing hypertension complicating pregnancy, unspecified trimester: Secondary | ICD-10-CM

## 2015-10-04 DIAGNOSIS — Z1389 Encounter for screening for other disorder: Secondary | ICD-10-CM

## 2015-10-04 DIAGNOSIS — O99283 Endocrine, nutritional and metabolic diseases complicating pregnancy, third trimester: Secondary | ICD-10-CM

## 2015-10-04 DIAGNOSIS — O289 Unspecified abnormal findings on antenatal screening of mother: Secondary | ICD-10-CM | POA: Diagnosis not present

## 2015-10-04 DIAGNOSIS — R768 Other specified abnormal immunological findings in serum: Secondary | ICD-10-CM

## 2015-10-04 DIAGNOSIS — O0993 Supervision of high risk pregnancy, unspecified, third trimester: Secondary | ICD-10-CM

## 2015-10-04 DIAGNOSIS — Z331 Pregnant state, incidental: Secondary | ICD-10-CM

## 2015-10-04 LAB — POCT URINALYSIS DIPSTICK
GLUCOSE UA: NEGATIVE
KETONES UA: NEGATIVE
Leukocytes, UA: NEGATIVE
Nitrite, UA: NEGATIVE
RBC UA: NEGATIVE

## 2015-10-04 MED ORDER — ACYCLOVIR 400 MG PO TABS: 400.0000 mg | ORAL_TABLET | Freq: Three times a day (TID) | ORAL | Status: DC

## 2015-10-04 NOTE — Progress Notes (Signed)
High Risk Pregnancy Diagnosis(es): CHTN [id number] [redacted]w[redacted]d Estimated Date of Delivery: 11/11/15 BP 128/88 mmHg  Pulse 80  Wt 331 lb (150.141 kg)  LMP 02/10/2015  Urinalysis: Positive for 1+ proteinuria HPI:  Doing well, no complaints BP, weight, and urine reviewed.  Reports good fm. Denies regular uc's, lof, vb, uti s/s.   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

## 2015-10-04 NOTE — Patient Instructions (Signed)
Call the office ([phone]) or go to [facility] if:  You begin to have strong, frequent contractions  Your water breaks.  Sometimes it is a big gush of fluid, sometimes it is just a trickle that keeps getting your panties wet or running down your legs  You have vaginal bleeding.  It is normal to have a small amount of spotting if your cervix was checked.   You don't feel your baby moving like normal.  If you don't, get you something to eat and drink and lay down and focus on feeling your baby move.  You should feel at least 10 movements in 2 hours.  If you don't, you should call the office or go to [facility].    Preterm Labor Information Preterm labor is when labor starts at less than 37 weeks of pregnancy. The normal length of a pregnancy is 39 to 41 weeks. CAUSES Often, there is no identifiable underlying cause as to why a woman goes into preterm labor. One of the most common known causes of preterm labor is infection. Infections of the uterus, cervix, vagina, amniotic sac, bladder, kidney, or even the lungs (pneumonia) can cause labor to start. Other suspected causes of preterm labor include:   Urogenital infections, such as yeast infections and bacterial vaginosis.   Uterine abnormalities (uterine shape, uterine septum, fibroids, or bleeding from the placenta).   A cervix that has been operated on (it may fail to stay closed).   Malformations in the fetus.   Multiple gestations (twins, triplets, and so on).   Breakage of the amniotic sac.  RISK FACTORS  Having a previous history of preterm labor.   Having premature rupture of membranes (PROM).   Having a placenta that covers the opening of the cervix (placenta previa).   Having a placenta that separates from the uterus (placental abruption).   Having a cervix that is too weak to hold the fetus in the uterus (incompetent cervix).   Having too much fluid in the amniotic sac (polyhydramnios).   Taking  illegal drugs or smoking while pregnant.   Not gaining enough weight while pregnant.   Being younger than 18 and older than 35 years old.   Having a low socioeconomic status.   Being African American. SYMPTOMS Signs and symptoms of preterm labor include:   Menstrual-like cramps, abdominal pain, or back pain.  Uterine contractions that are regular, as frequent as six in an hour, regardless of their intensity (may be mild or painful).  Contractions that start on the top of the uterus and spread down to the lower abdomen and back.   A sense of increased pelvic pressure.   A watery or bloody mucus discharge that comes from the vagina.  TREATMENT Depending on the length of the pregnancy and other circumstances, your health care provider may suggest bed rest. If necessary, there are medicines that can be given to stop contractions and to mature the fetal lungs. If labor happens before 34 weeks of pregnancy, a prolonged hospital stay may be recommended. Treatment depends on the condition of both you and the fetus.  WHAT SHOULD YOU DO IF YOU THINK YOU ARE IN PRETERM LABOR? Call your health care provider right away. You will need to go to the hospital to get checked immediately. HOW CAN YOU PREVENT PRETERM LABOR IN FUTURE PREGNANCIES? You should:   Stop smoking if you smoke.  Maintain healthy weight gain and avoid chemicals and drugs that are not necessary.  Be watchful for   any type of infection.  Inform your health care provider if you have a known history of preterm labor.   This information is not intended to replace advice given to you by your health care provider. Make sure you discuss any questions you have with your health care provider.   Document Released: 11/18/2003 Document Revised: 04/30/2013 Document Reviewed: 09/30/2012 Elsevier Interactive Patient Education 2016 [employer]  

## 2015-10-04 NOTE — Progress Notes (Signed)
[place] 34+4wks,cephalic,post pl gr 2,bilat adnexa's wnl,fhr 142 bpm,BPP 8/8,RI .58,.51,afi 14.9cm

## 2015-10-05 LAB — TSH: TSH: 2.88 u[IU]/mL (ref 0.450–4.500)

## 2015-10-07 ENCOUNTER — Encounter: Payer: Self-pay | Admitting: Obstetrics & Gynecology

## 2015-10-07 ENCOUNTER — Encounter: Payer: PRIVATE HEALTH INSURANCE | Admitting: Advanced Practice Midwife

## 2015-10-07 ENCOUNTER — Other Ambulatory Visit: Payer: PRIVATE HEALTH INSURANCE

## 2015-10-07 ENCOUNTER — Ambulatory Visit (INDEPENDENT_AMBULATORY_CARE_PROVIDER_SITE_OTHER): Payer: PRIVATE HEALTH INSURANCE | Admitting: Obstetrics & Gynecology

## 2015-10-07 ENCOUNTER — Other Ambulatory Visit: Payer: Self-pay | Admitting: Women's Health

## 2015-10-07 VITALS — BP 130/80 | HR 74 | Wt 328.0 lb

## 2015-10-07 DIAGNOSIS — O10919 Unspecified pre-existing hypertension complicating pregnancy, unspecified trimester: Secondary | ICD-10-CM

## 2015-10-07 DIAGNOSIS — O09893 Supervision of other high risk pregnancies, third trimester: Secondary | ICD-10-CM

## 2015-10-07 DIAGNOSIS — Z331 Pregnant state, incidental: Secondary | ICD-10-CM

## 2015-10-07 DIAGNOSIS — O10912 Unspecified pre-existing hypertension complicating pregnancy, second trimester: Secondary | ICD-10-CM | POA: Diagnosis not present

## 2015-10-07 DIAGNOSIS — Z1389 Encounter for screening for other disorder: Secondary | ICD-10-CM

## 2015-10-07 LAB — POCT URINALYSIS DIPSTICK
Blood, UA: NEGATIVE
GLUCOSE UA: NEGATIVE
Ketones, UA: NEGATIVE
LEUKOCYTES UA: NEGATIVE
NITRITE UA: NEGATIVE

## 2015-10-11 ENCOUNTER — Ambulatory Visit (INDEPENDENT_AMBULATORY_CARE_PROVIDER_SITE_OTHER): Payer: PRIVATE HEALTH INSURANCE

## 2015-10-11 ENCOUNTER — Ambulatory Visit (INDEPENDENT_AMBULATORY_CARE_PROVIDER_SITE_OTHER): Payer: PRIVATE HEALTH INSURANCE | Admitting: Women's Health

## 2015-10-11 ENCOUNTER — Encounter: Payer: Self-pay | Admitting: Women's Health

## 2015-10-11 ENCOUNTER — Other Ambulatory Visit: Payer: PRIVATE HEALTH INSURANCE | Admitting: Obstetrics & Gynecology

## 2015-10-11 VITALS — BP 138/82 | HR 84 | Wt 331.0 lb

## 2015-10-11 DIAGNOSIS — O10919 Unspecified pre-existing hypertension complicating pregnancy, unspecified trimester: Secondary | ICD-10-CM

## 2015-10-11 DIAGNOSIS — O10912 Unspecified pre-existing hypertension complicating pregnancy, second trimester: Secondary | ICD-10-CM

## 2015-10-11 DIAGNOSIS — Z1389 Encounter for screening for other disorder: Secondary | ICD-10-CM

## 2015-10-11 DIAGNOSIS — Z331 Pregnant state, incidental: Secondary | ICD-10-CM

## 2015-10-11 DIAGNOSIS — O0993 Supervision of high risk pregnancy, unspecified, third trimester: Secondary | ICD-10-CM

## 2015-10-11 DIAGNOSIS — Z3A36 36 weeks gestation of pregnancy: Secondary | ICD-10-CM

## 2015-10-11 LAB — POCT URINALYSIS DIPSTICK
GLUCOSE UA: NEGATIVE
Ketones, UA: NEGATIVE
Leukocytes, UA: NEGATIVE
NITRITE UA: NEGATIVE
RBC UA: NEGATIVE

## 2015-10-11 NOTE — Patient Instructions (Signed)
Call the office ([phone]) or go to [facility] if:  You begin to have strong, frequent contractions  Your water breaks.  Sometimes it is a big gush of fluid, sometimes it is just a trickle that keeps getting your panties wet or running down your legs  You have vaginal bleeding.  It is normal to have a small amount of spotting if your cervix was checked.   You don't feel your baby moving like normal.  If you don't, get you something to eat and drink and lay down and focus on feeling your baby move.  You should feel at least 10 movements in 2 hours.  If you don't, you should call the office or go to [facility].    Preterm Labor Information Preterm labor is when labor starts at less than 37 weeks of pregnancy. The normal length of a pregnancy is 39 to 41 weeks. CAUSES Often, there is no identifiable underlying cause as to why a woman goes into preterm labor. One of the most common known causes of preterm labor is infection. Infections of the uterus, cervix, vagina, amniotic sac, bladder, kidney, or even the lungs (pneumonia) can cause labor to start. Other suspected causes of preterm labor include:   Urogenital infections, such as yeast infections and bacterial vaginosis.   Uterine abnormalities (uterine shape, uterine septum, fibroids, or bleeding from the placenta).   A cervix that has been operated on (it may fail to stay closed).   Malformations in the fetus.   Multiple gestations (twins, triplets, and so on).   Breakage of the amniotic sac.  RISK FACTORS  Having a previous history of preterm labor.   Having premature rupture of membranes (PROM).   Having a placenta that covers the opening of the cervix (placenta previa).   Having a placenta that separates from the uterus (placental abruption).   Having a cervix that is too weak to hold the fetus in the uterus (incompetent cervix).   Having too much fluid in the amniotic sac (polyhydramnios).   Taking  illegal drugs or smoking while pregnant.   Not gaining enough weight while pregnant.   Being younger than 18 and older than 35 years old.   Having a low socioeconomic status.   Being African American. SYMPTOMS Signs and symptoms of preterm labor include:   Menstrual-like cramps, abdominal pain, or back pain.  Uterine contractions that are regular, as frequent as six in an hour, regardless of their intensity (may be mild or painful).  Contractions that start on the top of the uterus and spread down to the lower abdomen and back.   A sense of increased pelvic pressure.   A watery or bloody mucus discharge that comes from the vagina.  TREATMENT Depending on the length of the pregnancy and other circumstances, your health care provider may suggest bed rest. If necessary, there are medicines that can be given to stop contractions and to mature the fetal lungs. If labor happens before 34 weeks of pregnancy, a prolonged hospital stay may be recommended. Treatment depends on the condition of both you and the fetus.  WHAT SHOULD YOU DO IF YOU THINK YOU ARE IN PRETERM LABOR? Call your health care provider right away. You will need to go to the hospital to get checked immediately. HOW CAN YOU PREVENT PRETERM LABOR IN FUTURE PREGNANCIES? You should:   Stop smoking if you smoke.  Maintain healthy weight gain and avoid chemicals and drugs that are not necessary.  Be watchful for   any type of infection.  Inform your health care provider if you have a known history of preterm labor.   This information is not intended to replace advice given to you by your health care provider. Make sure you discuss any questions you have with your health care provider.   Document Released: 11/18/2003 Document Revised: 04/30/2013 Document Reviewed: 09/30/2012 Elsevier Interactive Patient Education 2016 [employer]  

## 2015-10-11 NOTE — Progress Notes (Signed)
[place] 35+4wks,cephalic,post pl gr 3,bilat adnexa's wnl,afi 15.4cm,fhr 149 bpm,BPP 8/8,RI .51,.52,EFW 2884g 60%,limited ultrasound because of pt body habitus

## 2015-10-11 NOTE — Progress Notes (Signed)
High Risk Pregnancy Diagnosis(es): CHTN [id number] [redacted]w[redacted]d Estimated Date of Delivery: 11/11/15 BP 138/82 mmHg  Pulse 84  Wt 331 lb (150.141 kg)  LMP 02/10/2015  Urinalysis: Positive for tr protein HPI:  Doing well, no complaints, unsure if she's had flu shot at work this season- will check on it when she goes back tomorrow BP, weight, and urine reviewed.  Reports good fm. Denies regular uc's, lof, vb, uti s/s.   Fundal Height:  49 Fetal Heart rate:  149 u/s Edema:  trace  Reviewed today's normal u/s: bpp 8/8, dopp .51&.52, efw 60%, afi 15.4cm. Discussed ptl s/s, fkc All questions were answered Assessment: [redacted]w[redacted]d CHTN Medication(s) Plans:  Continue labetalol 200mg  TID, baby asa, acyclovir, synthroid Treatment Plan:  Continue 2x/wk testing, deliver @ 39wks Follow up in 3d for high-risk OB appt and nst

## 2015-10-14 ENCOUNTER — Encounter: Payer: PRIVATE HEALTH INSURANCE | Admitting: Obstetrics & Gynecology

## 2015-10-14 ENCOUNTER — Ambulatory Visit (INDEPENDENT_AMBULATORY_CARE_PROVIDER_SITE_OTHER): Payer: PRIVATE HEALTH INSURANCE | Admitting: Obstetrics & Gynecology

## 2015-10-14 ENCOUNTER — Other Ambulatory Visit: Payer: PRIVATE HEALTH INSURANCE

## 2015-10-14 ENCOUNTER — Encounter: Payer: Self-pay | Admitting: Obstetrics & Gynecology

## 2015-10-14 VITALS — BP 130/80 | HR 80 | Wt 330.0 lb

## 2015-10-14 DIAGNOSIS — O10913 Unspecified pre-existing hypertension complicating pregnancy, third trimester: Secondary | ICD-10-CM

## 2015-10-14 DIAGNOSIS — Z3A36 36 weeks gestation of pregnancy: Secondary | ICD-10-CM

## 2015-10-14 DIAGNOSIS — O10919 Unspecified pre-existing hypertension complicating pregnancy, unspecified trimester: Secondary | ICD-10-CM

## 2015-10-14 DIAGNOSIS — Z1389 Encounter for screening for other disorder: Secondary | ICD-10-CM

## 2015-10-14 DIAGNOSIS — O0993 Supervision of high risk pregnancy, unspecified, third trimester: Secondary | ICD-10-CM

## 2015-10-14 DIAGNOSIS — Z331 Pregnant state, incidental: Secondary | ICD-10-CM

## 2015-10-14 LAB — POCT URINALYSIS DIPSTICK
Blood, UA: NEGATIVE
GLUCOSE UA: NEGATIVE
KETONES UA: NEGATIVE
Leukocytes, UA: NEGATIVE
Nitrite, UA: NEGATIVE

## 2015-10-14 NOTE — Progress Notes (Signed)
Fetal Surveillance Testing today:  Reactive NST   High Risk Pregnancy Diagnosis(es):   Chronic hypertension  [id number] [redacted]w[redacted]d Estimated Date of Delivery: 11/11/15  Blood pressure 130/80, pulse 80, weight 330 lb (149.687 kg), last menstrual period 02/10/2015.  Urinalysis: Negative   HPI: The patient is being seen today for ongoing management of chronic hypertension. 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 Patient reports good fetal movement, denies any bleeding and no rupture of membranes symptoms or regular contractions.  Fundal Height:  U+22 Fetal Heart rate:  130 Edema:  2+  Patient is without complaints other than noted in her HPI. All questions were answered.  All lab and sonogram results have been reviewed. Comments: abnormal:    Assessment:  1.  Pregnancy at [redacted]w[redacted]d,  Estimated Date of Delivery: 11/11/15 :                          2.  C HTN                        3.  Hypothyroid  Medication(s) Plans:  Labetalol 200 TID  Treatment Plan:  Twice weekly sonogram alt NST, induction at 39 weeks  No Follow-up on file. for appointment for high risk OB care  No orders of the defined types were placed in this encounter.   Orders Placed This Encounter  Procedures  . POCT urinalysis dipstick

## 2015-10-17 NOTE — Progress Notes (Signed)
.   Fetal Surveillance Testing today:  Reactive NST   High Risk Pregnancy Diagnosis(es):   Chronic hypertension  [id number] [redacted]w[redacted]d Estimated Date of Delivery: 11/11/15  Blood pressure 130/80, pulse 74, weight 328 lb (148.78 kg), last menstrual period 02/10/2015.  Urinalysis: Positive for trace   HPI: The patient is being seen today for ongoing management of chronic hypertension. Today she reports no problems   BP weight and urine results all reviewed and noted. Patient reports good fetal movement, denies any bleeding and no rupture of membranes symptoms or regular contractions.  Fundal Height:  47 Fetal Heart rate:  135 Edema:  none  Patient is without complaints other than noted in her HPI. All questions were answered.  All lab and sonogram results have been reviewed. Comments: abnormal:    Assessment:  1.  Pregnancy at51w0d ,  Estimated Date of Delivery: 11/11/15 :                          2.  Chronic HYpertension                        3.    Medication(s) Plans:  Labetalol 200 TID  Treatment Plan:  Twice weekly surveillance  Return for already scheduled. for appointment for high risk OB care  No orders of the defined types were placed in this encounter.   Orders Placed This Encounter  Procedures  . POCT urinalysis dipstick

## 2015-10-18 ENCOUNTER — Encounter: Payer: Self-pay | Admitting: Obstetrics & Gynecology

## 2015-10-18 ENCOUNTER — Ambulatory Visit (INDEPENDENT_AMBULATORY_CARE_PROVIDER_SITE_OTHER): Payer: PRIVATE HEALTH INSURANCE | Admitting: Obstetrics & Gynecology

## 2015-10-18 ENCOUNTER — Ambulatory Visit (INDEPENDENT_AMBULATORY_CARE_PROVIDER_SITE_OTHER): Payer: PRIVATE HEALTH INSURANCE

## 2015-10-18 VITALS — BP 110/70 | HR 80 | Wt 330.0 lb

## 2015-10-18 DIAGNOSIS — O10912 Unspecified pre-existing hypertension complicating pregnancy, second trimester: Secondary | ICD-10-CM

## 2015-10-18 DIAGNOSIS — O0993 Supervision of high risk pregnancy, unspecified, third trimester: Secondary | ICD-10-CM | POA: Diagnosis not present

## 2015-10-18 DIAGNOSIS — Z118 Encounter for screening for other infectious and parasitic diseases: Secondary | ICD-10-CM

## 2015-10-18 DIAGNOSIS — O10919 Unspecified pre-existing hypertension complicating pregnancy, unspecified trimester: Secondary | ICD-10-CM

## 2015-10-18 DIAGNOSIS — Z3685 Encounter for antenatal screening for Streptococcus B: Secondary | ICD-10-CM

## 2015-10-18 DIAGNOSIS — Z1159 Encounter for screening for other viral diseases: Secondary | ICD-10-CM

## 2015-10-18 DIAGNOSIS — O09893 Supervision of other high risk pregnancies, third trimester: Secondary | ICD-10-CM

## 2015-10-18 DIAGNOSIS — Z331 Pregnant state, incidental: Secondary | ICD-10-CM

## 2015-10-18 DIAGNOSIS — Z1389 Encounter for screening for other disorder: Secondary | ICD-10-CM

## 2015-10-18 LAB — POCT URINALYSIS DIPSTICK
GLUCOSE UA: NEGATIVE
Ketones, UA: NEGATIVE
Leukocytes, UA: NEGATIVE
NITRITE UA: NEGATIVE
Protein, UA: 1
RBC UA: NEGATIVE

## 2015-10-18 LAB — OB RESULTS CONSOLE GBS: STREP GROUP B AG: NEGATIVE

## 2015-10-18 NOTE — Progress Notes (Signed)
[place] 33+5wks,BPP 8/8,FHR  144 bpm,bilat adnexa wnl,post pl gr 3,afi [id number] .53,.60,cephalic

## 2015-10-18 NOTE — Progress Notes (Signed)
Fetal Surveillance Testing today:  BPP 8/8 RI good   High Risk Pregnancy Diagnosis(es):   [facility]  [id number] [redacted]w[redacted]d Estimated Date of Delivery: 11/11/15  Blood pressure 110/70, pulse 80, weight 330 lb (149.687 kg), last menstrual period 02/10/2015.  Urinalysis: Positive for 1+ protein   HPI: The patient is being seen today for ongoing management of CHTN. Today she reports no problems, feels better now that she is not working   BP weight and urine results all reviewed and noted. Patient reports good fetal movement, denies any bleeding and no rupture of membranes symptoms or regular contractions.  Fundal Height:  144 Fetal Heart rate:  U+24 Edema:  1+  Patient is without complaints other than noted in her HPI. All questions were answered.  All lab and sonogram results have been reviewed. Comments:    Assessment:  1.  Pregnancy at [redacted]w[redacted]d,  Estimated Date of Delivery: 11/11/15 :                          2.  CHTN                        3.    Medication(s) Plans:  Labetalol 200 TID + baby ASA  Treatment Plan:  Twice weekly induce 39 weeks  No Follow-up on file. for appointment for high risk OB care  No orders of the defined types were placed in this encounter.   Orders Placed This Encounter  Procedures  . GC/Chlamydia Probe Amp  . Strep Gp B NAA  . POCT urinalysis dipstick

## 2015-10-19 LAB — GC/CHLAMYDIA PROBE AMP
Chlamydia trachomatis, NAA: NEGATIVE
Neisseria gonorrhoeae by PCR: NEGATIVE

## 2015-10-20 LAB — STREP GP B NAA: STREP GROUP B AG: NEGATIVE

## 2015-10-21 ENCOUNTER — Ambulatory Visit (INDEPENDENT_AMBULATORY_CARE_PROVIDER_SITE_OTHER): Payer: PRIVATE HEALTH INSURANCE | Admitting: Obstetrics & Gynecology

## 2015-10-21 ENCOUNTER — Encounter: Payer: Self-pay | Admitting: Obstetrics & Gynecology

## 2015-10-21 VITALS — BP 150/98 | HR 80 | Wt 327.4 lb

## 2015-10-21 DIAGNOSIS — Z331 Pregnant state, incidental: Secondary | ICD-10-CM

## 2015-10-21 DIAGNOSIS — O10913 Unspecified pre-existing hypertension complicating pregnancy, third trimester: Secondary | ICD-10-CM | POA: Diagnosis not present

## 2015-10-21 DIAGNOSIS — Z1389 Encounter for screening for other disorder: Secondary | ICD-10-CM

## 2015-10-21 DIAGNOSIS — Z3A37 37 weeks gestation of pregnancy: Secondary | ICD-10-CM

## 2015-10-21 DIAGNOSIS — O09893 Supervision of other high risk pregnancies, third trimester: Secondary | ICD-10-CM

## 2015-10-21 DIAGNOSIS — O10912 Unspecified pre-existing hypertension complicating pregnancy, second trimester: Secondary | ICD-10-CM

## 2015-10-21 DIAGNOSIS — O10919 Unspecified pre-existing hypertension complicating pregnancy, unspecified trimester: Secondary | ICD-10-CM

## 2015-10-21 LAB — POCT URINALYSIS DIPSTICK
Blood, UA: NEGATIVE
GLUCOSE UA: NEGATIVE
KETONES UA: NEGATIVE
LEUKOCYTES UA: NEGATIVE
NITRITE UA: NEGATIVE
Protein, UA: 1

## 2015-10-21 NOTE — Progress Notes (Signed)
Fetal Surveillance Testing today:  Reactive NST   High Risk Pregnancy Diagnosis(es):   Chronic hypertension  [id number] [redacted]w[redacted]d Estimated Date of Delivery: 11/11/15  Blood pressure 150/98, pulse 80, weight 327 lb 6.4 oz (148.508 kg), last menstrual period 02/10/2015.  Urinalysis: Negative   HPI: The patient is being seen today for ongoing management of CHTN. Today she reports no problmes   BP weight and urine results all reviewed and noted. Patient reports good fetal movement, denies any bleeding and no rupture of membranes symptoms or regular contractions.  Fundal Height:  U+26 Fetal Heart rate:  135 Edema:  1+  Patient is without complaints other than noted in her HPI. All questions were answered.  All lab and sonogram results have been reviewed. Comments:    Assessment:  1.  Pregnancy at [redacted]w[redacted]d,  Estimated Date of Delivery: 11/11/15 :                          2.  CHTN                        3.    Medication(s) Plans:  Labetalol 200 TID Treatment Plan:  Twice weekly surveillance induction at 39 weeks  No Follow-up on file. for appointment for high risk OB care  No orders of the defined types were placed in this encounter.   Orders Placed This Encounter  Procedures  . POCT urinalysis dipstick

## 2015-10-22 ENCOUNTER — Other Ambulatory Visit: Payer: Self-pay | Admitting: Obstetrics & Gynecology

## 2015-10-22 DIAGNOSIS — O10919 Unspecified pre-existing hypertension complicating pregnancy, unspecified trimester: Secondary | ICD-10-CM

## 2015-10-25 ENCOUNTER — Ambulatory Visit (INDEPENDENT_AMBULATORY_CARE_PROVIDER_SITE_OTHER): Payer: PRIVATE HEALTH INSURANCE | Admitting: Obstetrics & Gynecology

## 2015-10-25 ENCOUNTER — Ambulatory Visit (INDEPENDENT_AMBULATORY_CARE_PROVIDER_SITE_OTHER): Payer: PRIVATE HEALTH INSURANCE

## 2015-10-25 ENCOUNTER — Encounter: Payer: Self-pay | Admitting: Obstetrics & Gynecology

## 2015-10-25 VITALS — BP 144/82 | HR 76 | Wt 330.0 lb

## 2015-10-25 DIAGNOSIS — O10913 Unspecified pre-existing hypertension complicating pregnancy, third trimester: Secondary | ICD-10-CM

## 2015-10-25 DIAGNOSIS — Z331 Pregnant state, incidental: Secondary | ICD-10-CM

## 2015-10-25 DIAGNOSIS — O09893 Supervision of other high risk pregnancies, third trimester: Secondary | ICD-10-CM

## 2015-10-25 DIAGNOSIS — Z1389 Encounter for screening for other disorder: Secondary | ICD-10-CM

## 2015-10-25 DIAGNOSIS — O10919 Unspecified pre-existing hypertension complicating pregnancy, unspecified trimester: Secondary | ICD-10-CM

## 2015-10-25 LAB — POCT URINALYSIS DIPSTICK
Blood, UA: NEGATIVE
GLUCOSE UA: NEGATIVE
Ketones, UA: NEGATIVE
LEUKOCYTES UA: NEGATIVE
NITRITE UA: NEGATIVE

## 2015-10-25 NOTE — Progress Notes (Signed)
[place] 37+4wks,cephalic,post pl gr 3,bilat adnexa's wnl,afi 15 cm,BPP 8/8,RI .53,.54,FHR 146 bpm,EFW 3498 g 75%

## 2015-10-25 NOTE — Progress Notes (Signed)
Fetal Surveillance Testing today:  BPP 8/8, Dopplers excellent   High Risk Pregnancy Diagnosis(es):   Chronic Hypertension  [id number] [redacted]w[redacted]d Estimated Date of Delivery: 11/11/15  Blood pressure 144/82, pulse 76, weight 330 lb (149.687 kg), last menstrual period 02/10/2015.  Urinalysis: Positive for 1+ protein   HPI: The patient is being seen today for ongoing management of chronic hypertension. Today she reports no problems   BP weight and urine results all reviewed and noted. Patient reports good fetal movement, denies any bleeding and no rupture of membranes symptoms or regular contractions.  Fundal Height:  28 Fetal Heart rate:  145 Edema:  none  Patient is without complaints other than noted in her HPI. All questions were answered.  All lab and sonogram results have been reviewed. Comments:    Assessment:  1.  Pregnancy at [redacted]w[redacted]d,  Estimated Date of Delivery: 11/11/15 :                          2.  Chronic Hypertension                        3.    Medication(s) Plans:  continuelabetalol 200 TID  Treatment Plan:  Twice weekly, induciton at 39 weeks  No Follow-up on file. for appointment for high risk OB care  No orders of the defined types were placed in this encounter.   Orders Placed This Encounter  Procedures  . POCT urinalysis dipstick

## 2015-10-28 ENCOUNTER — Encounter: Payer: Self-pay | Admitting: Obstetrics & Gynecology

## 2015-10-28 ENCOUNTER — Ambulatory Visit (INDEPENDENT_AMBULATORY_CARE_PROVIDER_SITE_OTHER): Payer: PRIVATE HEALTH INSURANCE | Admitting: Obstetrics & Gynecology

## 2015-10-28 VITALS — BP 140/90 | HR 76 | Wt 331.2 lb

## 2015-10-28 DIAGNOSIS — Z3A38 38 weeks gestation of pregnancy: Secondary | ICD-10-CM

## 2015-10-28 DIAGNOSIS — O09893 Supervision of other high risk pregnancies, third trimester: Secondary | ICD-10-CM

## 2015-10-28 DIAGNOSIS — Z331 Pregnant state, incidental: Secondary | ICD-10-CM

## 2015-10-28 DIAGNOSIS — Z1389 Encounter for screening for other disorder: Secondary | ICD-10-CM

## 2015-10-28 DIAGNOSIS — O10912 Unspecified pre-existing hypertension complicating pregnancy, second trimester: Secondary | ICD-10-CM

## 2015-10-28 DIAGNOSIS — O10919 Unspecified pre-existing hypertension complicating pregnancy, unspecified trimester: Secondary | ICD-10-CM

## 2015-10-28 DIAGNOSIS — O10913 Unspecified pre-existing hypertension complicating pregnancy, third trimester: Secondary | ICD-10-CM | POA: Diagnosis not present

## 2015-10-28 LAB — POCT URINALYSIS DIPSTICK
Blood, UA: NEGATIVE
Glucose, UA: NEGATIVE
KETONES UA: NEGATIVE
Leukocytes, UA: NEGATIVE
Nitrite, UA: NEGATIVE

## 2015-10-28 NOTE — Progress Notes (Signed)
Fetal Surveillance Testing today:  Reactive NST   High Risk Pregnancy Diagnosis(es):   Chronic hypertension  [id number] [redacted]w[redacted]d Estimated Date of Delivery: 11/11/15  Blood pressure 140/90, pulse 76, weight 331 lb 3.2 oz (150.231 kg), last menstrual period 02/10/2015.  Urinalysis: Negative   HPI: The patient is being seen today for ongoing management of chronic hypertension. Today she reports no problems   BP weight and urine results all reviewed and noted. Patient reports good fetal movement, denies any bleeding and no rupture of membranes symptoms or regular contractions.  Fundal Height:  U+26 Fetal Heart rate:  130 Edema:  none  Patient is without complaints other than noted in her HPI. All questions were answered.  All lab and sonogram results have been reviewed. Comments: abnormal:    Assessment:  1.  Pregnancy at [redacted]w[redacted]d,  Estimated Date of Delivery: 11/11/15 :                          2.  Chronic hypertension                        3.    Medication(s) Plans:  Labetalol 200 TID  Treatment Plan:  Sonogram Monday induction next Thursday scheduled  Return in about 4 days (around 11/01/2015) for BPP/sono, HROB. for appointment for high risk OB care  No orders of the defined types were placed in this encounter.   Orders Placed This Encounter  Procedures  . POCT urinalysis dipstick

## 2015-10-29 ENCOUNTER — Telehealth (HOSPITAL_COMMUNITY): Payer: Self-pay | Admitting: *Deleted

## 2015-10-29 ENCOUNTER — Encounter (HOSPITAL_COMMUNITY): Payer: Self-pay | Admitting: *Deleted

## 2015-10-29 NOTE — Telephone Encounter (Signed)
Preadmission screen  

## 2015-11-01 ENCOUNTER — Encounter: Payer: Self-pay | Admitting: Obstetrics & Gynecology

## 2015-11-01 ENCOUNTER — Ambulatory Visit (INDEPENDENT_AMBULATORY_CARE_PROVIDER_SITE_OTHER): Payer: PRIVATE HEALTH INSURANCE

## 2015-11-01 ENCOUNTER — Ambulatory Visit (INDEPENDENT_AMBULATORY_CARE_PROVIDER_SITE_OTHER): Payer: PRIVATE HEALTH INSURANCE | Admitting: Obstetrics & Gynecology

## 2015-11-01 VITALS — BP 150/90 | HR 80 | Wt 332.0 lb

## 2015-11-01 DIAGNOSIS — O09893 Supervision of other high risk pregnancies, third trimester: Secondary | ICD-10-CM | POA: Diagnosis not present

## 2015-11-01 DIAGNOSIS — Z3A39 39 weeks gestation of pregnancy: Secondary | ICD-10-CM

## 2015-11-01 DIAGNOSIS — Z1389 Encounter for screening for other disorder: Secondary | ICD-10-CM

## 2015-11-01 DIAGNOSIS — Z331 Pregnant state, incidental: Secondary | ICD-10-CM | POA: Diagnosis not present

## 2015-11-01 DIAGNOSIS — O10912 Unspecified pre-existing hypertension complicating pregnancy, second trimester: Secondary | ICD-10-CM

## 2015-11-01 DIAGNOSIS — O10919 Unspecified pre-existing hypertension complicating pregnancy, unspecified trimester: Secondary | ICD-10-CM

## 2015-11-01 LAB — POCT URINALYSIS DIPSTICK
Blood, UA: NEGATIVE
Glucose, UA: NEGATIVE
Ketones, UA: NEGATIVE
LEUKOCYTES UA: NEGATIVE
Nitrite, UA: NEGATIVE
PROTEIN UA: NEGATIVE

## 2015-11-01 NOTE — Progress Notes (Signed)
[place] 38+4wks,cephalic,BPP [id number] .51,.55,post pl gr 3,fhr 143 bpm

## 2015-11-01 NOTE — Progress Notes (Signed)
Fetal Surveillance Testing today:  BPP 8/8 with excellent Doppler flow studies   High Risk Pregnancy Diagnosis(es):   Chronic hypertension  [id number] [redacted]w[redacted]d Estimated Date of Delivery: 11/11/15  Blood pressure 150/90, pulse 80, weight 332 lb (150.594 kg), last menstrual period 02/10/2015.  Urinalysis: Positive for 1+   HPI: The patient is being seen today for ongoing management of chronic hypertension. Today she reports no problems   BP weight and urine results all reviewed and noted. Patient reports good fetal movement, denies any bleeding and no rupture of membranes symptoms or regular contractions.  Fundal Height:  U+26 Fetal Heart rate:  143 Edema:  1+  Patient is without complaints other than noted in her HPI. All questions were answered.  All lab and sonogram results have been reviewed. Comments: abnormal:    Assessment:  1.  Pregnancy at [redacted]w[redacted]d,  Estimated Date of Delivery: 11/11/15 :                          2.  Chronic hypertension                        3.    Medication(s) Plans:  Labetalol 200 TID + baby ASA  Treatment Plan:  Induction scheduled 2/23  No Follow-up on file. for appointment for high risk OB care  No orders of the defined types were placed in this encounter.   Orders Placed This Encounter  Procedures  . POCT urinalysis dipstick

## 2015-11-03 ENCOUNTER — Other Ambulatory Visit: Payer: Self-pay | Admitting: Advanced Practice Midwife

## 2015-11-04 ENCOUNTER — Inpatient Hospital Stay (HOSPITAL_COMMUNITY): Payer: Medicaid Other | Admitting: Anesthesiology

## 2015-11-04 ENCOUNTER — Inpatient Hospital Stay (HOSPITAL_COMMUNITY)
Admission: RE | Admit: 2015-11-04 | Discharge: 2015-11-07 | DRG: 774 | Disposition: A | Discharge status: Home / Self Care | Payer: Medicaid Other | Source: Ambulatory Visit | Attending: Obstetrics & Gynecology | Admitting: Obstetrics & Gynecology

## 2015-11-04 ENCOUNTER — Encounter (HOSPITAL_COMMUNITY): Payer: Self-pay

## 2015-11-04 DIAGNOSIS — Z6841 Body Mass Index (BMI) 40.0 and over, adult: Secondary | ICD-10-CM | POA: Diagnosis not present

## 2015-11-04 DIAGNOSIS — Z833 Family history of diabetes mellitus: Secondary | ICD-10-CM

## 2015-11-04 DIAGNOSIS — B009 Herpesviral infection, unspecified: Secondary | ICD-10-CM | POA: Diagnosis present

## 2015-11-04 DIAGNOSIS — O9852 Other viral diseases complicating childbirth: Secondary | ICD-10-CM | POA: Diagnosis present

## 2015-11-04 DIAGNOSIS — O99334 Smoking (tobacco) complicating childbirth: Secondary | ICD-10-CM | POA: Diagnosis present

## 2015-11-04 DIAGNOSIS — F17210 Nicotine dependence, cigarettes, uncomplicated: Secondary | ICD-10-CM | POA: Diagnosis present

## 2015-11-04 DIAGNOSIS — Z23 Encounter for immunization: Secondary | ICD-10-CM

## 2015-11-04 DIAGNOSIS — O10919 Unspecified pre-existing hypertension complicating pregnancy, unspecified trimester: Secondary | ICD-10-CM | POA: Diagnosis present

## 2015-11-04 DIAGNOSIS — O1002 Pre-existing essential hypertension complicating childbirth: Principal | ICD-10-CM | POA: Diagnosis present

## 2015-11-04 DIAGNOSIS — E039 Hypothyroidism, unspecified: Secondary | ICD-10-CM | POA: Diagnosis present

## 2015-11-04 DIAGNOSIS — O99214 Obesity complicating childbirth: Secondary | ICD-10-CM | POA: Diagnosis present

## 2015-11-04 DIAGNOSIS — Z8249 Family history of ischemic heart disease and other diseases of the circulatory system: Secondary | ICD-10-CM | POA: Diagnosis not present

## 2015-11-04 DIAGNOSIS — Z79899 Other long term (current) drug therapy: Secondary | ICD-10-CM

## 2015-11-04 DIAGNOSIS — Z3A39 39 weeks gestation of pregnancy: Secondary | ICD-10-CM | POA: Diagnosis not present

## 2015-11-04 DIAGNOSIS — O99284 Endocrine, nutritional and metabolic diseases complicating childbirth: Secondary | ICD-10-CM | POA: Diagnosis present

## 2015-11-04 HISTORY — DX: Nicotine dependence, unspecified, uncomplicated: F17.200

## 2015-11-04 HISTORY — DX: Herpesviral infection, unspecified: B00.9

## 2015-11-04 LAB — CBC
HEMATOCRIT: 34.8 % — AB (ref 36.0–46.0)
HEMATOCRIT: 34.5 % — AB (ref 36.0–46.0)
HEMOGLOBIN: 11.8 g/dL — AB (ref 12.0–15.0)
Hemoglobin: 12 g/dL (ref 12.0–15.0)
MCH: 30.3 pg (ref 26.0–34.0)
MCH: 31 pg (ref 26.0–34.0)
MCHC: 33.9 g/dL (ref 30.0–36.0)
MCHC: 34.8 g/dL (ref 30.0–36.0)
MCV: 89.5 fL (ref 78.0–100.0)
MCV: 89.1 fL (ref 78.0–100.0)
PLATELETS: 212 10*3/uL (ref 150–400)
Platelets: 222 10*3/uL (ref 150–400)
RBC: 3.89 MIL/uL (ref 3.87–5.11)
RBC: 3.87 MIL/uL (ref 3.87–5.11)
RDW: 16 % — AB (ref 11.5–15.5)
RDW: 16 % — AB (ref 11.5–15.5)
WBC: 11.5 10*3/uL — ABNORMAL HIGH (ref 4.0–10.5)
WBC: 10.4 10*3/uL (ref 4.0–10.5)

## 2015-11-04 LAB — TYPE AND SCREEN
ABO/RH(D): A POS
ANTIBODY SCREEN: NEGATIVE

## 2015-11-04 LAB — RPR: RPR: NONREACTIVE

## 2015-11-04 MED ORDER — MISOPROSTOL 25 MCG QUARTER TABLET
25.0000 ug | ORAL_TABLET | ORAL | Status: DC
  Administered 2015-11-05: 25 ug via VAGINAL
  Filled 2015-11-04: qty 0.25

## 2015-11-04 MED ORDER — PHENYLEPHRINE 40 MCG/ML (10ML) SYRINGE FOR IV PUSH (FOR BLOOD PRESSURE SUPPORT)
80.0000 ug | PREFILLED_SYRINGE | INTRAVENOUS | Status: DC | PRN
  Filled 2015-11-04: qty 2

## 2015-11-04 MED ORDER — FENTANYL 2.5 MCG/ML BUPIVACAINE 1/10 % EPIDURAL INFUSION (WH - ANES)
14.0000 mL/h | INTRAMUSCULAR | Status: DC | PRN
  Administered 2015-11-04 – 2015-11-05 (×5): 14 mL/h via EPIDURAL
  Filled 2015-11-04 (×4): qty 125

## 2015-11-04 MED ORDER — LEVOTHYROXINE SODIUM 75 MCG PO TABS
75.0000 ug | ORAL_TABLET | Freq: Every day | ORAL | Status: DC
  Administered 2015-11-04 – 2015-11-05 (×2): 75 ug via ORAL
  Filled 2015-11-04 (×3): qty 1

## 2015-11-04 MED ORDER — DEXTROSE 5 % IV SOLN
5.0000 10*6.[IU] | Freq: Once | INTRAVENOUS | Status: DC
  Filled 2015-11-04: qty 5

## 2015-11-04 MED ORDER — TERBUTALINE SULFATE 1 MG/ML IJ SOLN
0.2500 mg | Freq: Once | INTRAMUSCULAR | Status: DC | PRN
  Filled 2015-11-04: qty 1

## 2015-11-04 MED ORDER — ONDANSETRON HCL 4 MG/2ML IJ SOLN: 4.0000 mg | Freq: Four times a day (QID) | INTRAMUSCULAR | Status: DC | PRN

## 2015-11-04 MED ORDER — LACTATED RINGERS IV SOLN
500.0000 mL | Freq: Once | INTRAVENOUS | Status: AC
  Administered 2015-11-04: 500 mL via INTRAVENOUS

## 2015-11-04 MED ORDER — OXYTOCIN 10 UNIT/ML IJ SOLN
1.0000 m[IU]/min | INTRAMUSCULAR | Status: DC
  Administered 2015-11-05: 2 m[IU]/min via INTRAVENOUS
  Filled 2015-11-04: qty 4

## 2015-11-04 MED ORDER — ZOLPIDEM TARTRATE 5 MG PO TABS
5.0000 mg | ORAL_TABLET | Freq: Every evening | ORAL | Status: DC | PRN
  Administered 2015-11-04: 5 mg via ORAL
  Filled 2015-11-04: qty 1

## 2015-11-04 MED ORDER — LABETALOL HCL 200 MG PO TABS
200.0000 mg | ORAL_TABLET | Freq: Two times a day (BID) | ORAL | Status: DC
  Filled 2015-11-04: qty 1

## 2015-11-04 MED ORDER — MISOPROSTOL 25 MCG QUARTER TABLET: 25.0000 ug | ORAL_TABLET | ORAL | Status: DC

## 2015-11-04 MED ORDER — PHENYLEPHRINE 40 MCG/ML (10ML) SYRINGE FOR IV PUSH (FOR BLOOD PRESSURE SUPPORT)
80.0000 ug | PREFILLED_SYRINGE | INTRAVENOUS | Status: DC | PRN
  Filled 2015-11-04: qty 2
  Filled 2015-11-04: qty 20

## 2015-11-04 MED ORDER — OXYTOCIN BOLUS FROM INFUSION
500.0000 mL | INTRAVENOUS | Status: DC
  Administered 2015-11-05: 500 mL via INTRAVENOUS

## 2015-11-04 MED ORDER — EPHEDRINE 5 MG/ML INJ
10.0000 mg | INTRAVENOUS | Status: DC | PRN
  Filled 2015-11-04: qty 2

## 2015-11-04 MED ORDER — LACTATED RINGERS IV SOLN
500.0000 mL | INTRAVENOUS | Status: DC | PRN
  Administered 2015-11-05: 300 mL via INTRAVENOUS

## 2015-11-04 MED ORDER — LACTATED RINGERS IV SOLN
INTRAVENOUS | Status: DC
  Administered 2015-11-04 – 2015-11-05 (×3): via INTRAVENOUS

## 2015-11-04 MED ORDER — ACYCLOVIR 400 MG PO TABS
400.0000 mg | ORAL_TABLET | Freq: Three times a day (TID) | ORAL | Status: DC
  Administered 2015-11-04 – 2015-11-05 (×4): 400 mg via ORAL
  Filled 2015-11-04 (×6): qty 1

## 2015-11-04 MED ORDER — OXYTOCIN 10 UNIT/ML IJ SOLN: 2.5000 [IU]/h | INTRAMUSCULAR | Status: DC

## 2015-11-04 MED ORDER — MISOPROSTOL 25 MCG QUARTER TABLET
25.0000 ug | ORAL_TABLET | ORAL | Status: DC
  Administered 2015-11-04 (×2): 25 ug via VAGINAL
  Filled 2015-11-04 (×3): qty 0.25

## 2015-11-04 MED ORDER — LIDOCAINE HCL (PF) 1 % IJ SOLN
30.0000 mL | INTRAMUSCULAR | Status: AC | PRN
  Administered 2015-11-04 (×2): 7 mL via SUBCUTANEOUS

## 2015-11-04 MED ORDER — PENICILLIN G POTASSIUM 5000000 UNITS IJ SOLR
2.5000 10*6.[IU] | INTRAVENOUS | Status: DC
  Filled 2015-11-04 (×3): qty 2.5

## 2015-11-04 MED ORDER — ACETAMINOPHEN 325 MG PO TABS
650.0000 mg | ORAL_TABLET | ORAL | Status: DC | PRN
  Administered 2015-11-04: 650 mg via ORAL
  Filled 2015-11-04: qty 2

## 2015-11-04 MED ORDER — OXYTOCIN 10 UNIT/ML IJ SOLN
1.0000 m[IU]/min | INTRAVENOUS | Status: DC
  Administered 2015-11-04: 2 m[IU]/min via INTRAVENOUS
  Filled 2015-11-04: qty 4

## 2015-11-04 MED ORDER — DIPHENHYDRAMINE HCL 50 MG/ML IJ SOLN: 12.5000 mg | INTRAMUSCULAR | Status: DC | PRN

## 2015-11-04 MED ORDER — LABETALOL HCL 200 MG PO TABS
200.0000 mg | ORAL_TABLET | Freq: Three times a day (TID) | ORAL | Status: DC
  Administered 2015-11-04 – 2015-11-06 (×9): 200 mg via ORAL
  Filled 2015-11-04 (×10): qty 1

## 2015-11-04 MED ORDER — FENTANYL CITRATE (PF) 100 MCG/2ML IJ SOLN: 100.0000 ug | INTRAMUSCULAR | Status: DC | PRN

## 2015-11-04 MED ORDER — ACYCLOVIR 200 MG PO CAPS
400.0000 mg | ORAL_CAPSULE | Freq: Three times a day (TID) | ORAL | Status: DC
  Administered 2015-11-04: 400 mg via ORAL
  Filled 2015-11-04 (×4): qty 2

## 2015-11-04 MED ORDER — CITRIC ACID-SODIUM CITRATE 334-500 MG/5ML PO SOLN: 30.0000 mL | ORAL | Status: DC | PRN

## 2015-11-04 MED ORDER — OXYTOCIN 10 UNIT/ML IJ SOLN: 1.0000 m[IU]/min | INTRAVENOUS | Status: DC

## 2015-11-04 NOTE — Progress Notes (Signed)
   [name] is a 35 y.o. [id number] at [redacted]w[redacted]d  admitted for induction of labor due to Hypertension.  Subjective: Comfortable with epidural  Objective: Filed Vitals:   11/04/15 2032 11/04/15 2033 11/04/15 2102 11/04/15 2132  BP: 124/61  138/80 126/71  Pulse: 73 73 72 76  Temp:      TempSrc:      Resp:      Height:      Weight:          FHT:  FHR: 140 bpm, variability: moderate,  accelerations:  Present,  decelerations:  Absent UC:   none, irregular, every 1-3 minutes SVE:   Dilation: 4 Effacement (%): 50 Station: -3, -2 Exam by:: Ria Bush, CNM Pitocin @ 21 mu/min  Labs: Lab Results  Component Value Date   WBC 10.4 11/04/2015   HGB 12.0 11/04/2015   HCT 34.5* 11/04/2015   MCV 89.1 11/04/2015   PLT 212 11/04/2015    Assessment / Plan: IOL for CHTN, not working.   Will stop pitocin for now and try a few more doses of cytotec Labor: no Fetal Wellbeing:  Category I Pain Control:  Epidural Anticipated MOD:  NSVD  CRESENZO-DISHMAN,Ivar Domangue 11/04/2015, 10:07 PM

## 2015-11-04 NOTE — H&P (Signed)
[name] is a 35 y.o. female [phone] with IUP at [redacted]w[redacted]d by 8wk U/S presenting for IOL due to chronic hypertension. No contractions, vaginal bleeding, vaginal discharge, or LOF. Good fetal movement. No chest pain, SOB, RUQ pain, swelling, headaches, change in vision.  [facility] [redacted]w[redacted]d wks  Prenatal History/Complications: Chronic hypertension, initially on HCTZ-lisinopril and transitioned to labetalol once diagnosed with pregnancy.  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 Hypertension     chtn  . PIH (pregnancy induced hypertension)   . Pregnancy induced hypertension     history  . Thyroid disease   . Pregnant 03/22/2015    Past Surgical History: Past Surgical History  Procedure Laterality Date  . Dilation and curettage of uterus    . Cholecystectomy  2013    Obstetrical History: OB History    Gravida Para Term Preterm AB TAB SAB Ectopic Multiple Living   6 3 3  0 2 1  0  3      Social History: Social History   Social History  . Marital Status: Married    Spouse Name: N/A  . Number of Children: N/A  . Years of Education: N/A   Social History Main Topics  . Smoking status: Current Some Day Smoker -- 0.25 packs/day    Types: Cigarettes    Start date: 06/15/2012  . Smokeless tobacco: Never Used  . Alcohol Use: No     Comment: very rare; not now  . Drug Use: No  . Sexual Activity: Yes    Birth Control/ Protection: None   Other Topics Concern  . Not on file   Social History Narrative    Family History: Family History  Problem Relation Age of Onset  . Cancer Maternal Grandmother   . Diabetes Maternal Grandmother   . Heart disease Paternal Grandmother   . Heart disease Paternal Grandfather     Allergies: Allergies  Allergen Reactions  . Morphine And Related Itching     Prescriptions prior to admission  Medication Sig Dispense Refill Last Dose  . acyclovir (ZOVIRAX) 400 MG tablet Take 1 tablet (400 mg total) by mouth 3 (three) times daily. 90 tablet 3 Taking  . aspirin 81 MG tablet Take 81 mg by mouth daily.   Taking  . labetalol (NORMODYNE) 200 MG tablet Take 1 tablet (200 mg total) by mouth 3 (three) times daily. 90 tablet 2 Taking  . levothyroxine (SYNTHROID, LEVOTHROID) 75 MCG tablet Take 1 tablet (75 mcg total) by mouth daily. 30 tablet 3 Taking  . prenatal vitamin w/FE, FA (PRENATAL 1 + 1) 27-1 MG TABS tablet Take 1 tablet by mouth daily at 12 noon. 30 each 11 Taking     Review of Systems   Constitutional: No fevers, chills, fatigue, myalgias, chest pain, SOB.   Last menstrual period 02/10/2015. General appearance: alert, cooperative and morbidly obese Lungs: clear to auscultation bilaterally Heart: regular rate and rhythm Abdomen: soft, non-tender; bowel sounds normal Pelvic: adequate  Extremities: Homans sign is negative, no sign of DVT DTR's 1+ Presentation: cephalic Fetal monitoringBaseline: 140 bpm, Variability: Good {> 6 bpm), Accelerations: Reactive and Decelerations: Absent Uterine activity: None   Close, thick, high per RN check  Prenatal labs: ABO, Rh: A/Positive/-- (08/19 0848) Antibody: Negative (12/01 0844) Rubella: !Error! RPR: Non Reactive (12/01 0844)  HBsAg: Negative (08/19 0848)  HIV: Non Reactive (12/01 0844)  GBS: Negative (02/06 1545)  2 hr Glucola 79/106/97 Genetic screening  Normal  Anatomy US normal female    [place]  Initiated Care at  [place], [place], 3rd baby, married  Dating By 8wk u/s  Pap 06/15/14: neg  @ [place]  GC/CT Initial:   -/-             36+wks:  Genetic Screen NT/IT: normal  CF screen neg  Anatomic [place] Normal  Female '[name]' EICF not seen at 28 weeks  Flu vaccine   Tdap Recommended ~ 28wks  Glucose Screen  Early 2 hr normal   Repeat:79/106/97  GBS negative   Feed Preference breast  Contraception BTL  Consent: 09/16/15  Circumcision n/a  Childbirth Classes declined  Pediatrician [place]   Prenatal Transfer Tool  Maternal Diabetes: No Genetic Screening: Normal Maternal Ultrasounds/Referrals: Normal Fetal Ultrasounds or other Referrals:  None Maternal Substance Abuse:  No Significant Maternal Medications:  Meds include: Syntroid Significant Maternal Lab Results: Lab values include: Group B Strep negative     No results found for this or any previous visit (from the past 24 hour(s)).  Assessment: [name] is a 35 y.o. [id number] at [redacted]w[redacted]d by 8 wk U/S presenting for IOL for chronic hypertension.  #Labor: will start with Cytotec 25mg  vaginally q4hrs. #Pain: Would like an epidural eventually.  #FWB: Cat 1 #ID:  GBS negative, h/o HSV-2 (without outbreaks) on suppression with acyclovir  #MOF: breast  #MOC:BTL papers scanned in on 10/04/15  #Circ:  N/A, girl  #cHTN: continue home labetalol 200mg  BID, was on ASA 81mg . Last 24hr protein in 09/22/15 revealed 250mg .  #Hypothyroidism: pre-pregnancy dose synthroid 83mcg daily, currently on 76mcg.    Crystal Dorsey 11/04/2015, 12:51 AM  CNM attestation:  I have seen and examined this patient; I agree with above documentation in the resident's note.   [name] is a 35 y.o. [id number] here for IOL due to cHTN  PE: BP 118/76 mmHg  Pulse 84  Temp(Src) 98.9 F (37.2 C) (Axillary)  Resp 18  Ht 5\' 5"  (1.651 m)  Wt 150.594 kg (332 lb)  BMI 55.25 kg/m2  LMP 02/10/2015 Gen: calm comfortable, NAD Resp: normal effort, no distress Abd: gravid  ROS, labs, PMH reviewed  Plan: Admit to [facility] w/ cytotec to start with, then progress to other measures as needed Continue Labetalol & Synthroid  SHAW, KIMBERLY CNM 11/05/2015, 7:37 PM

## 2015-11-04 NOTE — Progress Notes (Signed)
   [name] is a 35 y.o. [id number] at [redacted]w[redacted]d  admitted for induction of labor due to Hypertension.  Subjective:  Comfortable with epidural  Objective: Filed Vitals:   11/04/15 1702 11/04/15 1732 11/04/15 1802 11/04/15 1832  BP: 128/69 131/78 129/67 134/70  Pulse: 76 77 75 72  Temp:   98.3 F (36.8 C)   TempSrc:   Oral   Resp: 20 18 16    Height:      Weight:          FHT:  FHR: 130 bpm, variability: moderate,  accelerations:  Present,  decelerations:  Absent UC:   irregular, every 1-3 minutes SVE:  4/thick/-2 Pitocin @ 19 mu/min  Labs: Lab Results  Component Value Date   WBC 10.4 11/04/2015   HGB 12.0 11/04/2015   HCT 34.5* 11/04/2015   MCV 89.1 11/04/2015   PLT 212 11/04/2015    Assessment / Plan: IOL for CHTN, latent labor with stalled progress Will continue to increase pitocin.  Hold off on AROM for now d/t thick cervix Labor: slow Fetal Wellbeing:  Category I Pain Control:  Epidural Anticipated MOD:  NSVD  CRESENZO-DISHMAN,[name] 11/04/2015, 6:51 PM

## 2015-11-04 NOTE — Progress Notes (Signed)
Labor Progress Note  [name] is a 35 y.o. [id number] at [redacted]w[redacted]d  admitted for induction of labor due to chronic hypertension.  S: Patient is laying comfortably in bed, no complaints at this time. Is not feeling contractions regularly, she will feel one "here and there". Denies any leakage of fluids  O:  BP 129/80 mmHg  Pulse 74  Temp(Src) 98.2 F (36.8 C) (Oral)  Resp 20  Ht 5\' 5"  (1.651 m)  Wt 150.594 kg (332 lb)  BMI 55.25 kg/m2  LMP 02/10/2015     FHT:  FHR: 130 bpm, variability: moderate,  accelerations:  Present,  decelerations:  Absent UC:   regular, every 2-5  Minutes on monitor but is not feeling them SVE:   Dilation: 4 Effacement (%): 50 Station: -3, -2 Exam by:: L. Mcdaniel rN  Pitocin @ 7.5  mu/min  Labs: Lab Results  Component Value Date   WBC 10.4 11/04/2015   HGB 12.0 11/04/2015   HCT 34.5* 11/04/2015   MCV 89.1 11/04/2015   PLT 212 11/04/2015    Assessment / Plan: 35 y.o. [id number] [redacted]w[redacted]d here for induction of labor due to chronic hypertension, on pitocin  Labor: Progressing on Pitocin, will continue to increase then AROM Fetal Wellbeing:  Category I Pain Control:  Labor support without medications at this time Anticipated MOD:  NSVD  Expectant management   Smitty Cords, MD [place], PGY-1

## 2015-11-04 NOTE — Anesthesia Procedure Notes (Signed)
Epidural Patient location during procedure: OB Start time: 11/04/2015 11:34 AM End time: 11/04/2015 11:40 AM  Staffing Anesthesiologist: Lyn Hollingshead Performed by: anesthesiologist   Preanesthetic Checklist Completed: patient identified, surgical consent, pre-op evaluation, timeout performed, IV checked, risks and benefits discussed and monitors and equipment checked  Epidural Patient position: sitting Prep: site prepped and draped and DuraPrep Patient monitoring: continuous pulse ox and blood pressure Approach: midline Location: L3-L4 Injection technique: LOR air  Needle:  Needle type: Tuohy  Needle gauge: 17 G Needle length: 9 cm and 9 Needle insertion depth: 9 cm Catheter type: closed end flexible Catheter size: 19 Gauge Catheter at skin depth: 15 cm Test dose: negative and Other  Assessment Sensory level: T9 Events: blood not aspirated, injection not painful, no injection resistance, negative IV test and no paresthesia  Additional Notes Reason for block:procedure for pain

## 2015-11-04 NOTE — Progress Notes (Signed)
Patient ID: [name], female   DOB: 17-Aug-1981, 35 y.o.   MRN: [id number] Nautica A Hofmann is a 35 y.o. [id number] at [redacted]w[redacted]d.  Subjective: Comfortable w/ epidural.   Objective: BP 130/62 mmHg  Pulse 76  Temp(Src) 98.4 F (36.9 C) (Oral)  Resp 18  Ht 5\' 5"  (1.651 m)  Wt 332 lb (150.594 kg)  BMI 55.25 kg/m2  LMP 02/10/2015   FHT:  FHR: 130 bpm, variability: mod,  accelerations:  15x15,  decelerations:  none UC:   Q 2-4 minutes, moderate  Dilation: 4 Effacement (%): 50 Cervical Position: Posterior Station: -3, -2 Presentation: Vertex Exam by:: [employer]: Results for orders placed or performed during the hospital encounter of 11/04/15 (from the past 24 hour(s))  CBC     Status: Abnormal   Collection Time: 11/04/15  1:51 AM  Result Value Ref Range   WBC 11.5 (H) 4.0 - 10.5 K/uL   RBC 3.89 3.87 - 5.11 MIL/uL   Hemoglobin 11.8 (L) 12.0 - 15.0 g/dL   HCT 34.8 (L) 36.0 - 46.0 %   MCV 89.5 78.0 - 100.0 fL   MCH 30.3 26.0 - 34.0 pg   MCHC 33.9 30.0 - 36.0 g/dL   RDW 16.0 (H) 11.5 - 15.5 %   Platelets 222 150 - 400 K/uL  RPR     Status: None   Collection Time: 11/04/15  1:51 AM  Result Value Ref Range   RPR Ser Ql Non Reactive Non Reactive  Type and screen     Status: None   Collection Time: 11/04/15  1:51 AM  Result Value Ref Range   ABO/RH(D) A POS    Antibody Screen NEG    Sample Expiration 11/07/2015   CBC     Status: Abnormal   Collection Time: 11/04/15 10:52 AM  Result Value Ref Range   WBC 10.4 4.0 - 10.5 K/uL   RBC 3.87 3.87 - 5.11 MIL/uL   Hemoglobin 12.0 12.0 - 15.0 g/dL   HCT 34.5 (L) 36.0 - 46.0 %   MCV 89.1 78.0 - 100.0 fL   MCH 31.0 26.0 - 34.0 pg   MCHC 34.8 30.0 - 36.0 g/dL   RDW 16.0 (H) 11.5 - 15.5 %   Platelets 212 150 - 400 K/uL    Assessment / Plan: [redacted]w[redacted]d week IUP Labor: Early Fetal Wellbeing:  Category I Pain Control:  Epidural Anticipated MOD:  SVD Continue increasing pitocin. Increase to 2x2 dosing.   [place],  [place] 11/04/2015 5:05 PM

## 2015-11-04 NOTE — Consults (Signed)
  Anesthesia Pain Consult Note  Patient: [name], 35 y.o., female  Consult Requested by: [name], MD  Reason for Consult: CRNA Pain Management  Level of Consciousness: alert  Pain 0 Goal 7

## 2015-11-04 NOTE — Anesthesia Preprocedure Evaluation (Addendum)
Anesthesia Evaluation  Patient identified by MRN, date of birth, ID band Patient awake    Reviewed: Allergy & Precautions, H&P , NPO status , Patient's Chart, lab work & pertinent test results  Airway Mallampati: III  TM Distance: >3 FB Neck ROM: full    Dental no notable dental hx.    Pulmonary Current Smoker,    Pulmonary exam normal        Cardiovascular hypertension, Pt. on home beta blockers Normal cardiovascular exam     Neuro/Psych negative neurological ROS  negative psych ROS   GI/Hepatic negative GI ROS, Neg liver ROS,   Endo/Other  Morbid obesity  Renal/GU negative Renal ROS     Musculoskeletal   Abdominal (+) + obese,   Peds  Hematology negative hematology ROS (+)   Anesthesia Other Findings   Reproductive/Obstetrics (+) Pregnancy                            Anesthesia Physical Anesthesia Plan  ASA: III  Anesthesia Plan: Epidural   Post-op Pain Management:    Induction:   Airway Management Planned:   Additional Equipment:   Intra-op Plan:   Post-operative Plan:   Informed Consent: I have reviewed the patients History and Physical, chart, labs and discussed the procedure including the risks, benefits and alternatives for the proposed anesthesia with the patient or authorized representative who has indicated his/her understanding and acceptance.     Plan Discussed with:   Anesthesia Plan Comments:         Anesthesia Quick Evaluation                                  Anesthesia Evaluation    History of Anesthesia Complications (+) history of anesthetic complications  Airway        Dental   Pulmonary Current Smoker,           Cardiovascular hypertension,      Neuro/Psych    GI/Hepatic   Endo/Other    Renal/GU      Musculoskeletal   Abdominal   Peds  Hematology   Anesthesia Other Findings   Reproductive/Obstetrics                              Anesthesia Physical Anesthesia Plan Anesthesia Quick Evaluation

## 2015-11-04 NOTE — Anesthesia Preprocedure Evaluation (Signed)
Anesthesia Evaluation    History of Anesthesia Complications (+) history of anesthetic complications  Airway        Dental   Pulmonary Current Smoker,           Cardiovascular hypertension,      Neuro/Psych    GI/Hepatic   Endo/Other    Renal/GU      Musculoskeletal   Abdominal   Peds  Hematology   Anesthesia Other Findings   Reproductive/Obstetrics                             Anesthesia Physical Anesthesia Plan Anesthesia Quick Evaluation

## 2015-11-05 ENCOUNTER — Encounter (HOSPITAL_COMMUNITY): Payer: Self-pay

## 2015-11-05 DIAGNOSIS — O99284 Endocrine, nutritional and metabolic diseases complicating childbirth: Secondary | ICD-10-CM

## 2015-11-05 DIAGNOSIS — O99214 Obesity complicating childbirth: Secondary | ICD-10-CM

## 2015-11-05 DIAGNOSIS — E039 Hypothyroidism, unspecified: Secondary | ICD-10-CM

## 2015-11-05 DIAGNOSIS — Z79899 Other long term (current) drug therapy: Secondary | ICD-10-CM

## 2015-11-05 DIAGNOSIS — O9852 Other viral diseases complicating childbirth: Secondary | ICD-10-CM

## 2015-11-05 DIAGNOSIS — O1002 Pre-existing essential hypertension complicating childbirth: Secondary | ICD-10-CM

## 2015-11-05 DIAGNOSIS — O99334 Smoking (tobacco) complicating childbirth: Secondary | ICD-10-CM

## 2015-11-05 DIAGNOSIS — Z3A39 39 weeks gestation of pregnancy: Secondary | ICD-10-CM

## 2015-11-05 LAB — COMPREHENSIVE METABOLIC PANEL
ALBUMIN: 2.4 g/dL — AB (ref 3.5–5.0)
ALK PHOS: 107 U/L (ref 38–126)
ALT: 27 U/L (ref 14–54)
AST: 37 U/L (ref 15–41)
Anion gap: 7 (ref 5–15)
BILIRUBIN TOTAL: 0.7 mg/dL (ref 0.3–1.2)
BUN: 9 mg/dL (ref 6–20)
CALCIUM: 8.5 mg/dL — AB (ref 8.9–10.3)
CO2: 22 mmol/L (ref 22–32)
Chloride: 105 mmol/L (ref 101–111)
Creatinine, Ser: 0.64 mg/dL (ref 0.44–1.00)
GFR calc Af Amer: >60 mL/min (ref >60)
GLUCOSE: 133 mg/dL — AB (ref 65–99)
POTASSIUM: 4.2 mmol/L (ref 3.5–5.1)
Sodium: 134 mmol/L — ABNORMAL LOW (ref 135–145)
TOTAL PROTEIN: 6.1 g/dL — AB (ref 6.5–8.1)

## 2015-11-05 LAB — CBC
HEMATOCRIT: 34.7 % — AB (ref 36.0–46.0)
HEMOGLOBIN: 11.6 g/dL — AB (ref 12.0–15.0)
MCH: 29.8 pg (ref 26.0–34.0)
MCHC: 33.4 g/dL (ref 30.0–36.0)
MCV: 89.2 fL (ref 78.0–100.0)
Platelets: 218 10*3/uL (ref 150–400)
RBC: 3.89 MIL/uL (ref 3.87–5.11)
RDW: 15.8 % — AB (ref 11.5–15.5)
WBC: 18.8 10*3/uL — AB (ref 4.0–10.5)

## 2015-11-05 MED ORDER — TETANUS-DIPHTH-ACELL PERTUSSIS 5-2.5-18.5 LF-MCG/0.5 IM SUSP
0.5000 mL | Freq: Once | INTRAMUSCULAR | Status: AC
  Administered 2015-11-06: 0.5 mL via INTRAMUSCULAR
  Filled 2015-11-05: qty 0.5

## 2015-11-05 MED ORDER — ONDANSETRON HCL 4 MG PO TABS: 4.0000 mg | ORAL_TABLET | ORAL | Status: DC | PRN

## 2015-11-05 MED ORDER — LEVOTHYROXINE SODIUM 75 MCG PO TABS: 75.0000 ug | ORAL_TABLET | Freq: Every day | ORAL | Status: DC

## 2015-11-05 MED ORDER — MISOPROSTOL 200 MCG PO TABS
800.0000 ug | ORAL_TABLET | Freq: Once | ORAL | Status: AC
  Administered 2015-11-05: 800 ug via ORAL

## 2015-11-05 MED ORDER — LIDOCAINE HCL (PF) 1 % IJ SOLN
INTRAMUSCULAR | Status: AC
  Filled 2015-11-05: qty 30

## 2015-11-05 MED ORDER — LIDOCAINE HCL (PF) 1 % IJ SOLN
INTRAMUSCULAR | Status: DC | PRN
  Administered 2015-11-04 (×2): 8 mL via EPIDURAL

## 2015-11-05 MED ORDER — LEVOTHYROXINE SODIUM 50 MCG PO TABS
50.0000 ug | ORAL_TABLET | Freq: Every day | ORAL | Status: DC
  Administered 2015-11-06 – 2015-11-07 (×2): 50 ug via ORAL
  Filled 2015-11-05 (×3): qty 1

## 2015-11-05 MED ORDER — SENNOSIDES-DOCUSATE SODIUM 8.6-50 MG PO TABS
2.0000 | ORAL_TABLET | ORAL | Status: DC
  Administered 2015-11-05: 2 via ORAL
  Filled 2015-11-05 (×2): qty 2

## 2015-11-05 MED ORDER — ONDANSETRON HCL 4 MG/2ML IJ SOLN: 4.0000 mg | INTRAMUSCULAR | Status: DC | PRN

## 2015-11-05 MED ORDER — DIBUCAINE 1 % RE OINT: 1.0000 "application " | TOPICAL_OINTMENT | RECTAL | Status: DC | PRN

## 2015-11-05 MED ORDER — LIDOCAINE HCL (PF) 1 % IJ SOLN: 30.0000 mL | INTRAMUSCULAR | Status: DC | PRN

## 2015-11-05 MED ORDER — ZOLPIDEM TARTRATE 5 MG PO TABS
5.0000 mg | ORAL_TABLET | Freq: Every evening | ORAL | Status: DC | PRN
Start: 2015-11-05 — End: 2015-11-07

## 2015-11-05 MED ORDER — ACETAMINOPHEN 325 MG PO TABS
650.0000 mg | ORAL_TABLET | ORAL | Status: DC | PRN
  Administered 2015-11-05 – 2015-11-06 (×2): 650 mg via ORAL
  Filled 2015-11-05 (×3): qty 2

## 2015-11-05 MED ORDER — PRENATAL MULTIVITAMIN CH
1.0000 | ORAL_TABLET | Freq: Every day | ORAL | Status: DC
  Administered 2015-11-06: 1 via ORAL
  Filled 2015-11-05: qty 1

## 2015-11-05 MED ORDER — WITCH HAZEL-GLYCERIN EX PADS: 1.0000 "application " | MEDICATED_PAD | CUTANEOUS | Status: DC | PRN

## 2015-11-05 MED ORDER — LABETALOL HCL 200 MG PO TABS: 200.0000 mg | ORAL_TABLET | Freq: Three times a day (TID) | ORAL | Status: DC

## 2015-11-05 MED ORDER — IBUPROFEN 600 MG PO TABS
600.0000 mg | ORAL_TABLET | Freq: Four times a day (QID) | ORAL | Status: DC
Start: 2015-11-05 — End: 2015-11-07
  Administered 2015-11-05 – 2015-11-07 (×6): 600 mg via ORAL
  Filled 2015-11-05 (×6): qty 1

## 2015-11-05 MED ORDER — MISOPROSTOL 200 MCG PO TABS
ORAL_TABLET | ORAL | Status: AC
  Administered 2015-11-05: 800 ug via ORAL
  Filled 2015-11-05: qty 4

## 2015-11-05 MED ORDER — LACTATED RINGERS IV SOLN: 500.0000 mL | INTRAVENOUS | Status: DC | PRN

## 2015-11-05 MED ORDER — LACTATED RINGERS IV SOLN: INTRAVENOUS | Status: DC

## 2015-11-05 MED ORDER — LANOLIN HYDROUS EX OINT: TOPICAL_OINTMENT | CUTANEOUS | Status: DC | PRN

## 2015-11-05 MED ORDER — ACYCLOVIR 400 MG PO TABS
400.0000 mg | ORAL_TABLET | Freq: Three times a day (TID) | ORAL | Status: DC
  Filled 2015-11-05 (×2): qty 1

## 2015-11-05 MED ORDER — BENZOCAINE-MENTHOL 20-0.5 % EX AERO
1.0000 "application " | INHALATION_SPRAY | CUTANEOUS | Status: DC | PRN
  Filled 2015-11-05: qty 56

## 2015-11-05 MED ORDER — ACETAMINOPHEN 325 MG PO TABS: 650.0000 mg | ORAL_TABLET | ORAL | Status: DC | PRN

## 2015-11-05 MED ORDER — ONDANSETRON HCL 4 MG/2ML IJ SOLN: 4.0000 mg | Freq: Four times a day (QID) | INTRAMUSCULAR | Status: DC | PRN

## 2015-11-05 MED ORDER — CITRIC ACID-SODIUM CITRATE 334-500 MG/5ML PO SOLN: 30.0000 mL | ORAL | Status: DC | PRN

## 2015-11-05 MED ORDER — OXYTOCIN BOLUS FROM INFUSION: 500.0000 mL | INTRAVENOUS | Status: DC

## 2015-11-05 MED ORDER — OXYTOCIN 10 UNIT/ML IJ SOLN: 2.5000 [IU]/h | INTRAVENOUS | Status: DC

## 2015-11-05 MED ORDER — SIMETHICONE 80 MG PO CHEW: 80.0000 mg | CHEWABLE_TABLET | ORAL | Status: DC | PRN

## 2015-11-05 MED ORDER — DIPHENHYDRAMINE HCL 25 MG PO CAPS: 25.0000 mg | ORAL_CAPSULE | Freq: Four times a day (QID) | ORAL | Status: DC | PRN

## 2015-11-05 NOTE — Progress Notes (Signed)
   Jerald A Dannelly is a 35 y.o. [id number] at [redacted]w[redacted]d  admitted for induction of labor due to Hypertension.  Subjective: Comfortable with epidural  Objective: Filed Vitals:   11/05/15 0102 11/05/15 0132 11/05/15 0202 11/05/15 0232  BP: 135/88 154/95 142/99 133/79  Pulse: 69 67 72 70  Temp:   98.5 F (36.9 C)   TempSrc:      Resp:   18   Height:      Weight:       Total I/O In: -  Out: 300 [Urine:300]  FHT:  FHR: 140 bpm, variability: moderate,  accelerations:  Present,  decelerations:  Absent UC:   none, irregular, every 2-4 minutes SVE:   Dilation: 4 Effacement (%): 50 Station: -3, -2 Exam by:: fran cresenzo-dishmon,cnm  Labs: Lab Results  Component Value Date   WBC 10.4 11/04/2015   HGB 12.0 11/04/2015   HCT 34.5* 11/04/2015   MCV 89.1 11/04/2015   PLT 212 11/04/2015    Assessment / Plan:  Had been contracting too often for cytotec. Cytotec 66mcg place in post vaginal fornix  Will restart pit in 4 hours Fetal Wellbeing:  Category I Pain Control:  Epidural Anticipated MOD:  NSVD  CRESENZO-DISHMAN,Reesa Gotschall 11/05/2015, 2:45 AM

## 2015-11-05 NOTE — Progress Notes (Addendum)
   [name] is a 35 y.o. [id number] at [redacted]w[redacted]d  admitted for induction of labor due to Hypertension.  Subjective: Comfortable with epidural  Objective: Filed Vitals:   11/05/15 0502 11/05/15 0532 11/05/15 0602 11/05/15 0632  BP: 140/95 145/80 133/74 135/90  Pulse: 70 72 72 71  Temp:    98.5 F (36.9 C)  TempSrc:      Resp:  18  18  Height:      Weight:          FHT:  FHR: 135 bpm, variability: moderate,  accelerations:  Present,  decelerations:  Absent UC:   irregular, every 2-5 minutes SVE:   Dilation: 4 Effacement (%): 50 Station: -3, -2 Exam by:: Aletha Halim RN Pitocin @ 4 mu/min  Labs: Lab Results  Component Value Date   WBC 10.4 11/04/2015   HGB 12.0 11/04/2015   HCT 34.5* 11/04/2015   MCV 89.1 11/04/2015   PLT 212 11/04/2015    Assessment / Plan: IOL, slow Increase pitocin until adequate labor  Labor: slow Fetal Wellbeing:  Category I Pain Control:  Epidural Anticipated MOD:  NSVD  CRESENZO-DISHMAN,Janee Ureste 11/05/2015, 7:49 AM

## 2015-11-05 NOTE — Anesthesia Procedure Notes (Signed)
Epidural Patient location during procedure: OB Start time: 11/04/2015 11:34 AM End time: 11/04/2015 11:40 AM  Staffing Anesthesiologist: Lyn Hollingshead Performed by: anesthesiologist   Preanesthetic Checklist Completed: patient identified, surgical consent, pre-op evaluation, timeout performed, IV checked, risks and benefits discussed and monitors and equipment checked  Epidural Patient position: sitting Prep: site prepped and draped and DuraPrep Patient monitoring: continuous pulse ox and blood pressure Approach: midline Location: L3-L4 Injection technique: LOR air  Needle:  Needle type: Tuohy  Needle gauge: 17 G Needle length: 9 cm and 9 Needle insertion depth: 9 cm Catheter type: closed end flexible Catheter size: 19 Gauge Catheter at skin depth: 15 cm Test dose: negative and Other  Assessment Sensory level: T9 Events: blood not aspirated, injection not painful, no injection resistance, negative IV test and no paresthesia  Additional Notes Reason for block:procedure for pain

## 2015-11-05 NOTE — Progress Notes (Signed)
Labor Progress Note Maciel A Klauer is a 35 y.o. [id number] at [redacted]w[redacted]d presented for IOL due to cHTN  S: s/p epidural. Feeling well, no pain. Feels tightening  O:  BP 156/87 mmHg  Pulse 72  Temp(Src) 99 F (37.2 C) (Oral)  Resp 17  Ht 5\' 5"  (1.651 m)  Wt 332 lb (150.594 kg)  BMI 55.25 kg/m2  LMP 02/10/2015 EFM: 120/mod/+accels, no decels  CVE: Dilation: 5 Effacement (%): 50 Cervical Position: Posterior Station: -3 Presentation: Vertex Exam by:: MD Ernestina Patches  Very challenging exam, attempted ROM but was no successful given position of cervical opening and a near right angle to reach the bag of water.  Swept membranes virgorously  A&P: 35 y.o. [id number] [redacted]w[redacted]d here for IOL due to cHTN #Labor: progressing slowly, still in latent labor.  Pit@10 , increase per protocol. Is s/p pitocin, pit break, cytotec x2, this round of pit was started at [phone] #Pain: epidural #FWB: Cat 1 #GBS neg  Caren Macadam, MD 10:18 AM

## 2015-11-05 NOTE — Progress Notes (Signed)
[name] is a 35 y.o. [id number] at [redacted]w[redacted]d admitted for induction of labor due to Hypertension.  Subjective:   Objective: BP 120/71 mmHg  Pulse 70  Temp(Src) 98.5 F (36.9 C) (Oral)  Resp 17  Ht 5\' 5"  (1.651 m)  Wt 150.594 kg (332 lb)  BMI 55.25 kg/m2  LMP 02/10/2015 I/O last 3 completed shifts: In: -  Out: [id number] [Urine:1750] Total I/O In: -  Out: 900 [Urine:900]  FHT:  FHR: 130s bpm, variability: moderate,  accelerations:  Present,  decelerations:  Absent UC:   irregular, every 1-4 minutes, some coupling/tripling- Pit @ 73mu/min SVE:   Dilation: 5 Effacement (%): 50 Station: -2, -1 Exam by:: Shelly Majestic, RN w/additional check by Shelly Churn RN- AROM- copious clear fluid  Labs: Lab Results  Component Value Date   WBC 10.4 11/04/2015   HGB 12.0 11/04/2015   HCT 34.5* 11/04/2015   MCV 89.1 11/04/2015   PLT 212 11/04/2015    Assessment / Plan: IUP@39 .1wks cHTN Hypothyroidism IOL process  Now that membranes are ruptured, hopeful that ctx strength will increase and labor will progress [name] CNM 11/05/2015, 3:29 PM

## 2015-11-06 ENCOUNTER — Encounter (HOSPITAL_COMMUNITY)
Admission: RE | Disposition: A | Discharge status: Home / Self Care | Payer: Self-pay | Source: Ambulatory Visit | Attending: Obstetrics & Gynecology

## 2015-11-06 LAB — CBC
HEMATOCRIT: 32.1 % — AB (ref 36.0–46.0)
HEMOGLOBIN: 10.8 g/dL — AB (ref 12.0–15.0)
MCH: 29.8 pg (ref 26.0–34.0)
MCHC: 33.6 g/dL (ref 30.0–36.0)
MCV: 88.4 fL (ref 78.0–100.0)
Platelets: 213 10*3/uL (ref 150–400)
RBC: 3.63 MIL/uL — ABNORMAL LOW (ref 3.87–5.11)
RDW: 15.7 % — AB (ref 11.5–15.5)
WBC: 15.5 10*3/uL — ABNORMAL HIGH (ref 4.0–10.5)

## 2015-11-06 SURGERY — LIGATION, FALLOPIAN TUBE, POSTPARTUM
Anesthesia: Epidural | Site: Abdomen | Laterality: Bilateral

## 2015-11-06 MED ORDER — FAMOTIDINE 20 MG PO TABS
40.0000 mg | ORAL_TABLET | Freq: Once | ORAL | Status: AC
  Administered 2015-11-06: 40 mg via ORAL
  Filled 2015-11-06: qty 2

## 2015-11-06 MED ORDER — LACTATED RINGERS IV SOLN
INTRAVENOUS | Status: DC
  Administered 2015-11-06: 08:00:00 via INTRAVENOUS

## 2015-11-06 MED ORDER — LISINOPRIL 20 MG PO TABS
20.0000 mg | ORAL_TABLET | Freq: Every day | ORAL | Status: DC
  Administered 2015-11-06 – 2015-11-07 (×2): 20 mg via ORAL
  Filled 2015-11-06 (×2): qty 1

## 2015-11-06 MED ORDER — HYDROCHLOROTHIAZIDE 25 MG PO TABS
25.0000 mg | ORAL_TABLET | Freq: Every day | ORAL | Status: DC
  Administered 2015-11-06 – 2015-11-07 (×2): 25 mg via ORAL
  Filled 2015-11-06 (×2): qty 1

## 2015-11-06 MED ORDER — METOCLOPRAMIDE HCL 10 MG PO TABS
10.0000 mg | ORAL_TABLET | Freq: Once | ORAL | Status: AC
  Administered 2015-11-06: 10 mg via ORAL
  Filled 2015-11-06: qty 1

## 2015-11-06 MED ORDER — INFLUENZA VAC SPLIT QUAD 0.5 ML IM SUSY
0.5000 mL | PREFILLED_SYRINGE | INTRAMUSCULAR | Status: AC
  Administered 2015-11-07: 0.5 mL via INTRAMUSCULAR
  Filled 2015-11-06: qty 0.5

## 2015-11-06 NOTE — Progress Notes (Signed)
Post Partum Day 1 Subjective: no complaints  Objective: Blood pressure 150/78, pulse 74, temperature 98.9 F (37.2 C), temperature source Oral, resp. rate 20, height 5\' 5"  (1.651 m), weight 150.594 kg (332 lb), last menstrual period 02/10/2015, SpO2 98 %, unknown if currently breastfeeding.  Physical Exam:  General: alert, cooperative and no distress Lochia: appropriate Uterine Fundus: firm Incision: n/a DVT Evaluation: No evidence of DVT seen on physical exam.   Recent Labs  11/05/15 2114 11/06/15 0520  HGB 11.6* 10.8*  HCT 34.7* 32.1*    Assessment/Plan: Contraception plans BTL  Due to morbid obesity recommend interval tubal as OP, she agrees with this plan.   LOS: 2 days   Rajohn Henery 11/06/2015, 9:10 AM

## 2015-11-06 NOTE — Lactation Note (Signed)
This note was copied from a baby's chart. Lactation Consultation Note  Initial visit made.  Breastfeeding consultation services and support information given to patient.  Mom chooses to pump and bottlefeed.  She initially expressed 28 mls but none recently.  Reassured and instructed to continue to pump and hand express every 3 hours.  Mom also giving formula supplementation per bottle.  Patient Name: [name] Today's Date: 11/06/2015 Reason for consult: Initial assessment   Maternal Data Formula Feeding for Exclusion: Yes Reason for exclusion: Mother's choice to formula and breast feed on admission  Feeding Feeding Type: Bottle Fed - Formula  LATCH Score/Interventions                      Lactation Tools Discussed/Used Pump Review: Setup, frequency, and cleaning;Milk Storage Initiated by:: RN Date initiated:: 11/05/15   Consult Status Consult Status: PRN    Ave Filter 11/06/2015, 2:18 PM

## 2015-11-06 NOTE — Anesthesia Postprocedure Evaluation (Signed)
Anesthesia Post Note  Patient: [name]  Procedure(s) Performed: * No procedures listed *  Patient location during evaluation: Mother Baby Anesthesia Type: Epidural Level of consciousness: awake and alert and oriented Pain management: satisfactory to patient Vital Signs Assessment: post-procedure vital signs reviewed and stable Respiratory status: spontaneous breathing and nonlabored ventilation Cardiovascular status: stable Postop Assessment: no headache, no backache, no signs of nausea or vomiting, adequate PO intake and patient able to bend at knees (patient up walking) Anesthetic complications: no    Last Vitals:  Filed Vitals:   11/06/15 0218 11/06/15 0505  BP: 132/70 150/78  Pulse: 78 74  Temp: 37.3 C 37.2 C  Resp: 20 20    Last Pain:  Filed Vitals:   11/06/15 0900  PainSc: 0-No pain                 Jayanth Szczesniak

## 2015-11-07 MED ORDER — LISINOPRIL 20 MG PO TABS: 20.0000 mg | ORAL_TABLET | Freq: Every day | ORAL | Status: DC

## 2015-11-07 NOTE — Progress Notes (Signed)
Patient has new order for HCTZ/lisinopril. Called and spoke with Dr.Arnold for verification of order per patients request. Was instructed to give HCTZ/lisinopril/labetalol at this time.

## 2015-11-07 NOTE — Discharge Summary (Signed)
OB Discharge Summary  Patient Name: [name] DOB: October 29, 1980 MRN: [id number]  Date of admission: 11/04/2015 Delivering MD: [name] D   Date of discharge: 11/07/2015  Admitting diagnosis: INDUCTION desires sterilization Intrauterine pregnancy: [redacted]w[redacted]d     Secondary diagnosis:Active Problems:   Chronic hypertension in pregnancy   Chronic hypertension  Additional problems:none     Discharge diagnosis: Term Pregnancy Delivered, CHTN and PPH                                                                     Post partum procedures:none  Augmentation: Pitocin  Complications: None  Hospital course:  Induction of Labor With Vaginal Delivery   35 y.o. yo [id number] at [redacted]w[redacted]d was admitted to the hospital 11/04/2015 for induction of labor.  Indication for induction: CHTN.  Patient had an uncomplicated labor course as follows: Membrane Rupture Time/Date: 3:12 PM ,11/05/2015   Intrapartum Procedures: Episiotomy: None [1]                                         Lacerations:  1st degree [2];Perineal [11]  Patient had delivery of a Viable infant.  Information for the patient's newborn:  Kati, Shafer [id number]  Delivery Method: Vag-Spont   11/05/2015  Details of delivery can be found in separate delivery note.  Patient had a routine postpartum course. Patient is discharged home 11/07/2015.   Physical exam  Filed Vitals:   11/06/15 1850 11/06/15 2250 11/06/15 2303 11/07/15 0512  BP: 137/90 167/88 167/88 130/68  Pulse: 87 76  74  Temp: 98.4 F (36.9 C)   98.3 F (36.8 C)  TempSrc: Oral   Oral  Resp: 18   18  Height:      Weight:      SpO2:       General: alert, cooperative and no distress Lochia: appropriate Uterine Fundus: firm Incision: N/A DVT Evaluation: No evidence of DVT seen on physical exam. Negative Homan's sign. No cords or calf tenderness. Labs: Lab Results  Component Value Date   WBC 15.5* 11/06/2015   HGB 10.8* 11/06/2015   HCT 32.1*  11/06/2015   MCV 88.4 11/06/2015   PLT 213 11/06/2015   CMP Latest Ref Rng 11/05/2015  Glucose 65 - 99 mg/dL 133(H)  BUN 6 - 20 mg/dL 9  Creatinine 0.44 - 1.00 mg/dL 0.64  Sodium 135 - 145 mmol/L 134(L)  Potassium 3.5 - 5.1 mmol/L 4.2  Chloride 101 - 111 mmol/L 105  CO2 22 - 32 mmol/L 22  Calcium 8.9 - 10.3 mg/dL 8.5(L)  Total Protein 6.5 - 8.1 g/dL 6.1(L)  Total Bilirubin 0.3 - 1.2 mg/dL 0.7  Alkaline Phos 38 - 126 U/L 107  AST 15 - 41 U/L 37  ALT 14 - 54 U/L 27    Discharge instruction: per After Visit Summary and "Baby and Me Booklet".  After Visit Meds:    Medication List    ASK your doctor about these medications        acyclovir 400 MG tablet  Commonly known as:  ZOVIRAX  Take 1 tablet (400 mg total) by mouth 3 (three)  times daily.     aspirin 81 MG tablet  Take 81 mg by mouth daily.     labetalol 200 MG tablet  Commonly known as:  NORMODYNE  Take 1 tablet (200 mg total) by mouth 3 (three) times daily.     levothyroxine 75 MCG tablet  Commonly known as:  SYNTHROID, LEVOTHROID  Take 1 tablet (75 mcg total) by mouth daily.     prenatal vitamin w/FE, FA 27-1 MG Tabs tablet  Take 1 tablet by mouth daily at 12 noon.        Diet: routine diet  Activity: Advance as tolerated. Pelvic rest for 6 weeks.   Outpatient follow up:6 weeks Follow up Appt:Future Appointments Date Time Provider [place]  11/15/2015 1:30 PM Jonnie Kind, MD [facility]   Follow up visit: No Follow-up on file.  Postpartum contraception: IUD Mirena  Newborn Data: Live born female  Birth Weight: 7 lb 14.1 oz (3575 g) APGAR: 9, 10  Baby Feeding: Bottle and Breast Disposition:home with mother   11/07/2015 Abbott Pao, CNM

## 2015-11-10 ENCOUNTER — Telehealth: Payer: Self-pay | Admitting: *Deleted

## 2015-11-10 NOTE — Telephone Encounter (Signed)
Pt states was seen at [facility] c/o nausea and vomiting, chills was given Zofran and IV fluids. Pt c/o breast hardness and soreness. Pt states she thinks she may have mastitis. Pt given an appt for evaluation.

## 2015-11-11 ENCOUNTER — Encounter: Payer: Self-pay | Admitting: Adult Health

## 2015-11-11 ENCOUNTER — Ambulatory Visit (INDEPENDENT_AMBULATORY_CARE_PROVIDER_SITE_OTHER): Payer: PRIVATE HEALTH INSURANCE | Admitting: Adult Health

## 2015-11-11 VITALS — BP 158/80 | HR 78 | Ht 65.0 in | Wt 306.0 lb

## 2015-11-11 DIAGNOSIS — F53 Postpartum depression: Secondary | ICD-10-CM

## 2015-11-11 DIAGNOSIS — R112 Nausea with vomiting, unspecified: Secondary | ICD-10-CM

## 2015-11-11 DIAGNOSIS — O99345 Other mental disorders complicating the puerperium: Secondary | ICD-10-CM

## 2015-11-11 DIAGNOSIS — N61 Mastitis without abscess: Secondary | ICD-10-CM | POA: Insufficient documentation

## 2015-11-11 HISTORY — DX: Postpartum depression: F53.0

## 2015-11-11 HISTORY — DX: Mastitis without abscess: N61.0

## 2015-11-11 HISTORY — DX: Nausea with vomiting, unspecified: R11.2

## 2015-11-11 MED ORDER — AMOXICILLIN-POT CLAVULANATE 875-125 MG PO TABS: 1.0000 | ORAL_TABLET | Freq: Two times a day (BID) | ORAL | Status: DC

## 2015-11-11 MED ORDER — ESCITALOPRAM OXALATE 10 MG PO TABS: 10.0000 mg | ORAL_TABLET | Freq: Every day | ORAL | Status: DC

## 2015-11-11 MED ORDER — HYDROCODONE-ACETAMINOPHEN 5-325 MG PO TABS: 1.0000 | ORAL_TABLET | Freq: Four times a day (QID) | ORAL | Status: DC | PRN

## 2015-11-11 NOTE — Progress Notes (Signed)
Subjective:     Patient ID: [name], female   DOB: 09-06-1981, 35 y.o.   MRN: [id number]  HPI [name] is a 35 year old black female, married, who just had a vaginal delivery 2/24 and is in today, with pain and tenderness right breast, says it is red and hard.She went to ER at [facility] 2/28 for nausea and vomiting and had IV fluids an was given phenergan, has not taken any yet and still has nausea and vomiting at times.She is teary and feels overwhelmed. She husband not helping much.  Review of Systems + breast pain and tenderness +teary and overwhelmed+ nausea and vomiting Reviewed past medical,surgical, social and family history. Reviewed medications and allergies.     Objective:   Physical Exam BP 158/80 mmHg  Pulse 78  Ht 5\' 5"  (1.651 m)  Wt 306 lb (138.801 kg)  BMI 50.92 kg/m2  LMP 02/10/2015  Breastfeeding? No  Temp 98.4, Skin warm and dry,  Breasts: right has milk from nipples, no retraction, has area ar 3-5 o'clock that is red and warm and tender, and firm, on left has milk from nipples, no redness or heat, but is lumpy through out,Abdomen soft, tender RUQ, has had GB removed.No HSM noted. .EPDS score 21.Discussed taking time for self and asking for help.She denies suicidial ideations.If has any changes in feeling, call or go to ER.   Face time 15 minutes with 50 % counseling.  Assessment:     Mastitis Postpartum depression Nausea and vomiting     Plan:    Use cabbage leaves, in bra,wear tight bra and ice packs, do not pump Rx Augmentin 875-125 mg take 1 bid x 10 days, #20 Rx norco 5-325 mg #30 take 1 every 6 hours prn pain, no refill Rx lexapro 10 mg #30 take 1 daily with 6 refills Follow up in 5 days

## 2015-11-11 NOTE — Patient Instructions (Signed)
Take lexapro daily Push fluids Eat every 2 hours Take augmentin bid Use cabbage leaves and ice wear tight bra Follow up in 5 days

## 2015-11-15 ENCOUNTER — Telehealth: Payer: Self-pay | Admitting: *Deleted

## 2015-11-15 ENCOUNTER — Encounter: Payer: PRIVATE HEALTH INSURANCE | Admitting: Obstetrics and Gynecology

## 2015-11-15 NOTE — Telephone Encounter (Signed)
[name] from the [facility] states pt blood pressures today are 138/100, 130/90. Also, states pt stated she is taking Lisinopril for her B/P but forgot to take it this am. Attempted to contact pt to remind of appt tomorrow with Shelly Monaco, NP but no answer and voicemail not set up.

## 2015-11-16 ENCOUNTER — Encounter: Payer: Self-pay | Admitting: Adult Health

## 2015-11-16 ENCOUNTER — Ambulatory Visit (INDEPENDENT_AMBULATORY_CARE_PROVIDER_SITE_OTHER): Payer: PRIVATE HEALTH INSURANCE | Admitting: Adult Health

## 2015-11-16 VITALS — BP 128/92 | HR 64 | Ht 65.0 in | Wt 298.0 lb

## 2015-11-16 DIAGNOSIS — F53 Postpartum depression: Secondary | ICD-10-CM

## 2015-11-16 DIAGNOSIS — N61 Mastitis without abscess: Secondary | ICD-10-CM | POA: Diagnosis not present

## 2015-11-16 DIAGNOSIS — I10 Essential (primary) hypertension: Secondary | ICD-10-CM | POA: Diagnosis not present

## 2015-11-16 DIAGNOSIS — O99345 Other mental disorders complicating the puerperium: Secondary | ICD-10-CM

## 2015-11-16 NOTE — Progress Notes (Signed)
Subjective:     Patient ID: [name], female   DOB: 1981-02-20, 35 y.o.   MRN: [id number]  HPI Cheryel is a 35 year old black female back in follow up of having mastitis and postpartum depression and HHN said BP was up yesterday.She had not taken lisinopril.She is no longer teary, and feels better.She still wants tubal.  Review of Systems Patient denies any headaches, hearing loss, fatigue, blurred vision, shortness of breath, chest pain, abdominal pain, problems with bowel movements, urination, or intercourse(not having sex). No joint pain or mood swings. Reviewed past medical,surgical, social and family history. Reviewed medications and allergies.     Objective:   Physical Exam BP 128/92 mmHg  Pulse 64  Ht 5\' 5"  (1.651 m)  Wt 298 lb (135.172 kg)  BMI 49.59 kg/m2  LMP 02/10/2015  Breastfeeding? No Skin warm and dry. Lungs: clear to ausculation bilaterally. Cardiovascular: regular rate and rhythm.Breasts not hard or red and she says they fell fine now.Encouraged to take lisinopril everyday.    Assessment:     Hypertension  Mastitis,resolved Postpartum depression    Plan:     Take lisinopril daily Continue lexapro Finish augmentin Follow up in 4 weeks for postpartum visit

## 2015-11-16 NOTE — Patient Instructions (Signed)
Take lisinopril every day Take lexapro Finish augmentin Follow up in 4 weeks

## 2015-12-02 ENCOUNTER — Telehealth: Payer: Self-pay | Admitting: Advanced Practice Midwife

## 2015-12-02 NOTE — Telephone Encounter (Signed)
Spoke with pt. Pt has had no bleeding x 1 week. Tuesday, she started passing blood clots. It didn't last the whole day. Wednesday, she started bleeding again, passing clots. She is not having any pain. Pt concerned. I advised she would need to be seen. Pt voiced understanding. Call transferred to front desk for appt. [place]

## 2015-12-03 ENCOUNTER — Ambulatory Visit (INDEPENDENT_AMBULATORY_CARE_PROVIDER_SITE_OTHER): Payer: PRIVATE HEALTH INSURANCE | Admitting: Obstetrics & Gynecology

## 2015-12-03 ENCOUNTER — Encounter: Payer: Self-pay | Admitting: Obstetrics & Gynecology

## 2015-12-03 VITALS — BP 120/90 | HR 72 | Wt 314.0 lb

## 2015-12-03 DIAGNOSIS — N939 Abnormal uterine and vaginal bleeding, unspecified: Secondary | ICD-10-CM

## 2015-12-03 DIAGNOSIS — N853 Subinvolution of uterus: Secondary | ICD-10-CM

## 2015-12-03 MED ORDER — METHYLERGONOVINE MALEATE 0.2 MG PO TABS: ORAL_TABLET | ORAL | Status: DC

## 2015-12-03 NOTE — Progress Notes (Signed)
Patient ID: [name], female   DOB: 11-28-1980, 35 y.o.   MRN: [id number] Chief Complaint  Patient presents with  . gyn visit    vaginal bleeding/ clots not consistent    Blood pressure 120/90, pulse 72, weight 314 lb (142.429 kg), last menstrual period 02/10/2015, not currently breastfeeding.  Pt with episodic periods of heavy bleeding, 3 episodes over the past 1 week, clots then stops No real pain or fever No sex  Pt showed me a photo(thanks), no tissue just blood clot  Imp  Sub involution of uterus  Plan Methergine 0.2 mg TID x 3 days, follow up 1 week

## 2015-12-09 ENCOUNTER — Telehealth: Payer: Self-pay | Admitting: *Deleted

## 2015-12-09 NOTE — Telephone Encounter (Signed)
Opened in error

## 2015-12-09 NOTE — Telephone Encounter (Signed)
Pt states unable to get her Rx for Methergine due to being on back order per [place]. Per Dr.Eure, will prescribe to [facility]. Pt informed.   [name] unable to fill Methergine due to back order. Verbal order for Cytotec 800 mcg PO TID x 3 days given by Dr. Elonda Husky, prescription called to [facility]. Pt informed.

## 2015-12-10 ENCOUNTER — Ambulatory Visit: Payer: PRIVATE HEALTH INSURANCE | Admitting: Obstetrics & Gynecology

## 2015-12-20 ENCOUNTER — Ambulatory Visit (INDEPENDENT_AMBULATORY_CARE_PROVIDER_SITE_OTHER): Payer: PRIVATE HEALTH INSURANCE | Admitting: Women's Health

## 2015-12-20 ENCOUNTER — Encounter: Payer: Self-pay | Admitting: Women's Health

## 2015-12-20 ENCOUNTER — Other Ambulatory Visit (INDEPENDENT_AMBULATORY_CARE_PROVIDER_SITE_OTHER): Payer: PRIVATE HEALTH INSURANCE

## 2015-12-20 DIAGNOSIS — D649 Anemia, unspecified: Secondary | ICD-10-CM

## 2015-12-20 DIAGNOSIS — N854 Malposition of uterus: Secondary | ICD-10-CM | POA: Diagnosis not present

## 2015-12-20 DIAGNOSIS — N939 Abnormal uterine and vaginal bleeding, unspecified: Secondary | ICD-10-CM

## 2015-12-20 DIAGNOSIS — D500 Iron deficiency anemia secondary to blood loss (chronic): Secondary | ICD-10-CM | POA: Diagnosis not present

## 2015-12-20 DIAGNOSIS — E039 Hypothyroidism, unspecified: Secondary | ICD-10-CM | POA: Insufficient documentation

## 2015-12-20 DIAGNOSIS — N888 Other specified noninflammatory disorders of cervix uteri: Secondary | ICD-10-CM | POA: Diagnosis not present

## 2015-12-20 DIAGNOSIS — O9081 Anemia of the puerperium: Secondary | ICD-10-CM

## 2015-12-20 DIAGNOSIS — N83201 Unspecified ovarian cyst, right side: Secondary | ICD-10-CM

## 2015-12-20 LAB — POCT HEMOGLOBIN: HEMOGLOBIN: 5.9 g/dL — AB (ref 12.2–16.2)

## 2015-12-20 MED ORDER — MEGESTROL ACETATE 40 MG PO TABS: ORAL_TABLET | ORAL | Status: DC

## 2015-12-20 MED ORDER — FERROUS SULFATE 325 (65 FE) MG PO TABS: 325.0000 mg | ORAL_TABLET | Freq: Three times a day (TID) | ORAL | Status: DC

## 2015-12-20 NOTE — Progress Notes (Signed)
PELVIC US TA/TV: homogenous anteverted uterus,thickened EEC 18 mm w/color flow seen,normal rt ov,lt ov simple cyst 4.3 x 4.3 x 4.3cm,no cul de sac fluid,ov's appear to be mobile,mult small nabothian cysts,no pain during ultrasound

## 2015-12-20 NOTE — Progress Notes (Signed)
Patient ID: [name], female   DOB: 30-Dec-1980, 35 y.o.   MRN: [id number] Subjective:    [name] is a 35 y.o. [id number] African American female who presents for a postpartum visit. She is 6 weeks postpartum following a spontaneous vaginal delivery at 39.1 gestational weeks after IOL for [facility]. Anesthesia: epidural. I have fully reviewed the prenatal and intrapartum course. Required cytotec 89mcg at delivery d/t 719ml EBL- most EBL prior to placental detachment per note. Postpartum course has been complicated by mastitis- rx'd augmentin and norco 11/11/15, PPD- rx'd lexapro 10mg  daily 11/11/15. States she has stopped the lexapro, and feeling fine off of it- just had 'the baby blues'. Had episodic periods of heavy bleeding/clots dx w/ subinvolution of uterus and rx'd methergine 0.2mg  TID x 3d on 12/03/15- however there was a back order at 2 pharmacies, so was rx'd cytotec 866mcg po tid x 3d- took at the end of last week- states bleeding slowed while taking, but has resumed having periodic episodes of large clots/bleeding. No real cramping. Has been fatigued. Denies dizziness/lightheadedness/chest pain. No longer taking pnv. Denies fever/chills/abd/pelvic pain. States she had to have D&C ~1wk after one of her babies for retained products/infection. Baby's course has been uncomplicated. Baby is feeding by bottle. Bleeding as noted above. Bowel function is normal. Bladder function is normal. Patient is not sexually active. Last sexual activity: prior to birth of baby. Contraception method is originally wanted BTL- was told 'abd too large' to do immediately postpartum- and has since changed her mind and is contemplating another pregnancy. Postpartum depression screening: negative. Score 4.  Last pap 06/15/14 and was neg at [facility].  The following portions of the patient's history were reviewed and updated as appropriate: allergies, current medications, past medical history, past surgical history and  problem list.  Review of Systems Pertinent items are noted in HPI.   Filed Vitals:   12/20/15 1055  BP: 120/80  Pulse: 74  Weight: 306 lb (138.801 kg)   No LMP recorded.  Objective:   General:  alert, cooperative and no distress   Breasts:  deferred, no complaints  Lungs: clear to auscultation bilaterally  Heart:  regular rate and rhythm  Abdomen: soft, nontender   Vulva: normal  Vagina: normal vagina, no foreign objects  Cervix:  Spec exam: visually closed, small clot in os  Corpus: Well-involuted  Adnexa:  Non-palpable  Rectal Exam: No hemorrhoids        Today's pelvic u/s (worked in): [name] is a 35 y.o. [id number] for a pelvic sonogram for heavy postpartum bleeding. Uterus 10.9 x 7.7 x 5.4 cm, homogenous anteverted uterus Endometrium 18 mm, symmetrical, thickened w/color flow  Right ovary 3.1 x 2.8 x 1.6 cm, wnl Left ovary 4.4 x 4.3 x 4.1 cm, simple cyst 4.3 x 4.3 x 4.3cm No free fluid,mult.small nabothian cysts,no pain during ultrasound   Technician Comments: PELVIC US TA/TV: homogenous anteverted uterus,thickened EEC 18 mm w/color flow seen,normal rt ov,lt ov simple cyst 4.3 x 4.3 x 4.3cm,no cul de sac fluid,ov's appear to be mobile,mult small nabothian cysts,no pain during ultrasound  [employer] 12/20/2015 12:33 PM  Fingerstick Hgb: 5.9  Assessment:   Postpartum exam 6 wks s/p SVB after IOL for CHTN CHTN Severe anemia, asymptomatic Likely retained placental fragments Resolved ppd/blues Bottlefeeding Depression screening Contraception counseling   Plan:  Discussed all w/ J[facility]- will get cbc, anemia panel, hcg. Rx Fe and megace algorithm Contraception: abstinence for now, if desires pregnancy recommended  at least 40mth inbetween births Follow up in: 1 day for visit w/ JVF to discuss poc/possible D&C, or earlier if needed  Tawnya Crook CNM, [facility] 12/20/2015 12:54 PM

## 2015-12-21 ENCOUNTER — Ambulatory Visit (INDEPENDENT_AMBULATORY_CARE_PROVIDER_SITE_OTHER): Payer: PRIVATE HEALTH INSURANCE | Admitting: Obstetrics and Gynecology

## 2015-12-21 ENCOUNTER — Other Ambulatory Visit: Payer: Self-pay | Admitting: Obstetrics and Gynecology

## 2015-12-21 ENCOUNTER — Encounter: Payer: Self-pay | Admitting: Obstetrics and Gynecology

## 2015-12-21 LAB — BETA HCG QUANT (REF LAB): hCG Quant: 2 m[IU]/mL

## 2015-12-21 LAB — CBC
HEMATOCRIT: 20.7 % — AB (ref 34.0–46.6)
HEMOGLOBIN: 6.5 g/dL — AB (ref 11.1–15.9)
MCH: 26.6 pg (ref 26.6–33.0)
MCHC: 31.4 g/dL — AB (ref 31.5–35.7)
MCV: 85 fL (ref 79–97)
Platelets: 572 10*3/uL — ABNORMAL HIGH (ref 150–379)
RBC: 2.44 x10E6/uL — AB (ref 3.77–5.28)
RDW: 16.4 % — ABNORMAL HIGH (ref 12.3–15.4)
WBC: 7.1 10*3/uL (ref 3.4–10.8)

## 2015-12-21 LAB — IRON AND TIBC
Iron Saturation: 3 % — CL (ref 15–55)
Iron: 11 ug/dL — ABNORMAL LOW (ref 27–159)
TIBC: 354 ug/dL (ref 250–450)
UIBC: 343 ug/dL (ref 131–425)

## 2015-12-21 LAB — FOLATE: Folate: 18.2 ng/mL (ref >3.0)

## 2015-12-21 LAB — VITAMIN B12: Vitamin B-12: 539 pg/mL (ref 211–946)

## 2015-12-21 LAB — FERRITIN: FERRITIN: 4 ng/mL — AB (ref 15–150)

## 2015-12-21 MED ORDER — MISOPROSTOL 200 MCG PO TABS: 800.0000 ug | ORAL_TABLET | Freq: Every day | ORAL | Status: DC

## 2015-12-21 MED ORDER — SODIUM CHLORIDE 0.9 % IV SOLN: Freq: Once | INTRAVENOUS | Status: DC

## 2015-12-21 MED ORDER — HYDROCODONE-ACETAMINOPHEN 5-325 MG PO TABS: 1.0000 | ORAL_TABLET | Freq: Four times a day (QID) | ORAL | Status: DC | PRN

## 2015-12-21 NOTE — Progress Notes (Signed)
Patient ID: [name], female   DOB: 1980/12/30, 35 y.o.   MRN: [id number]   [place] [facility] Visit  Patient name: [name] MRN [id number]  Date of birth: August 29, 1981  CC & HPI:  [name] is a 35 y.o. female presenting today for late postpartum hemorrhage, with u/s suggesting a small fragment of retained tissue. Pt is having intermittent gushes of heavy bleeding, sometimes with clear liquid. , and anemia to Hgb 6.2 on central stick. Qhcg is returned to negative. The u/s suggests blood flow in the retained tissue, so a polyp is a possibilty.  Plans will be for proceeding with cytotec tonight and tomorrow, in hopes of expelling tissue, but proceeding with plans for Hysteroscopy and dilation and curettage on Thursday. Due to severity of anemia, will require transfusion prior to [facility]. Will arrange transfusion Thursday morning .   ROS:  Review of Systems  Constitutional: Negative for fever and chills.   .ros  Pertinent History Reviewed:   Reviewed: Significant for  Prior postpartum hemorrhage required d&C after 2008 delivery 3 days after delivery . Medical         Past Medical History  Diagnosis Date  . Hypertension     chtn  . PIH (pregnancy induced hypertension)   . Pregnancy induced hypertension     history  . Thyroid disease   . Pregnant 03/22/2015  . HSV infection   . Smoker   . Mastitis 11/11/2015  . 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 Nausea and vomiting 11/11/2015                              Surgical Hx:    Past Surgical History  Procedure Laterality Date  . Dilation and curettage of uterus    . Cholecystectomy  2013   Medications: Reviewed & Updated - see associated section                       Current outpatient prescriptions:  .  escitalopram (LEXAPRO) 10 MG tablet, Take 1 tablet (10 mg total) by mouth daily., Disp: 30 tablet, Rfl: 6 .  lisinopril (PRINIVIL,ZESTRIL) 20 MG tablet, Take 20 mg by mouth daily., Disp: , Rfl:  .  ferrous sulfate 325  (65 FE) MG tablet, Take 1 tablet (325 mg total) by mouth 3 (three) times daily with meals., Disp: 90 tablet, Rfl: 3 .  misoprostol (CYTOTEC) 200 MCG tablet, Take 4 tablets (800 mcg total) by mouth daily., Disp: 8 tablet, Rfl: 0   Social History: Reviewed -  reports that she has quit smoking. Her smoking use included Cigarettes. She started smoking about 3 years ago. She smoked 0.25 packs per day. She has never used smokeless tobacco.  Objective Findings:  Vitals: Blood pressure 142/80, pulse 100, height 5\' 5"  (1.651 m), weight 309 lb (140.161 kg), not currently breastfeeding.  Physical Examination: General appearance - alert, well appearing, and in no distress, oriented to person, place, and time and overweight Mental status - alert, oriented to person, place, and time, normal mood, behavior, speech, dress, motor activity, and thought processes Eyes - pupils equal and reactive, extraocular eye movements intact Chest - clear to auscultation, no wheezes, rales or rhonchi, symmetric air entry Heart - normal rate and regular rhythm Abdomen - soft, nontender, nondistended, no masses or organomegaly Exam limited by habitus. Pelvic - normal external genitalia, vulva, vagina, cervix, uterus and adnexa, VAGINA: normal appearing  vagina with normal color and discharge, no lesions, PELVIC FLOOR EXAM: no cystocele, rectocele or prolapse noted Extremities - peripheral pulses normal, no pedal edema, no clubbing or cyanosis Skin - normal coloration and turgor, no rashes, no suspicious skin lesions noted Anemic , pale , tolerating it well CBC Latest Ref Rng 12/20/2015 11/06/2015 11/05/2015  WBC 3.4 - 10.8 x10E3/uL 7.1 15.5(H) 18.8(H)  Hemoglobin 12.2 - 16.2 g/dL 5.9(A) 10.8(L) 11.6(L)  Hematocrit 34.0 - 46.6 % 20.7(L) 32.1(L) 34.7(L)  Platelets 150 - 379 x10E3/uL 572(H) 213 218     See u/s report Assessment & Plan:   A:  1. Retained tissue fragment after delivery, late postpartum bleeding due to retained  tissue 2 anemia of chronic blood loss. P:  1. Cytotec tonight and tomorrow night to try to expel tissue 2  If unsuccessful will proceed with hysteroscopy ,Dilation and curettage on Thursday after transfusion 2 units prbc that morning.  3 NPO p mn Thursday. Will set up crossmatch tomorrow.

## 2015-12-21 NOTE — H&P (Signed)
Shelly Kind, MD at 12/21/2015 2:18 PM     Status: Signed       Expand All Collapse All   Patient ID: [name], female DOB: [birth year], 35 y.o. MRN: [id number]  [place] [facility] Visit  Patient name: Shelly Smilowitz DiggsMRN [id number] Date of birth: Jan 16, 1981  CC & HPI:  [name] is a 35 y.o. female presenting today for late postpartum hemorrhage, with u/s suggesting a small fragment of retained tissue. Pt is having intermittent gushes of heavy bleeding, sometimes with clear liquid. , and anemia to Hgb 6.2 on central stick. Qhcg is returned to negative. The u/s suggests blood flow in the retained tissue, so a polyp is a possibilty.  Plans will be for proceeding with cytotec tonight and tomorrow, in hopes of expelling tissue, but proceeding with plans for Hysteroscopy and dilation and curettage on Thursday. Due to severity of anemia, will require transfusion prior to [facility]. Will arrange transfusion Thursday morning .   ROS:  Review of Systems  Constitutional: Negative for fever and chills.   .ros  Pertinent History Reviewed:  Reviewed: Significant for Prior postpartum hemorrhage required d&C after 2008 delivery 3 days after delivery . Medical  Past Medical History  Diagnosis Date  . Hypertension     chtn  . PIH (pregnancy induced hypertension)   . Pregnancy induced hypertension     history  . Thyroid disease   . Pregnant 03/22/2015  . HSV infection   . Smoker   . Mastitis 11/11/2015  . 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 Nausea and vomiting 11/11/2015    Surgical Hx:  Past Surgical History  Procedure Laterality Date  . Dilation and curettage of uterus    . Cholecystectomy  2013   Medications: Reviewed & Updated - see associated section   Current outpatient prescriptions:  . escitalopram (LEXAPRO) 10 MG tablet, Take  1 tablet (10 mg total) by mouth daily., Disp: 30 tablet, Rfl: 6 . lisinopril (PRINIVIL,ZESTRIL) 20 MG tablet, Take 20 mg by mouth daily., Disp: , Rfl:  . ferrous sulfate 325 (65 FE) MG tablet, Take 1 tablet (325 mg total) by mouth 3 (three) times daily with meals., Disp: 90 tablet, Rfl: 3 . misoprostol (CYTOTEC) 200 MCG tablet, Take 4 tablets (800 mcg total) by mouth daily., Disp: 8 tablet, Rfl: 0   Social History: Reviewed -  reports that she has quit smoking. Her smoking use included Cigarettes. She started smoking about 3 years ago. She smoked 0.25 packs per day. She has never used smokeless tobacco.  Objective Findings:  Vitals: Blood pressure 142/80, pulse 100, height 5\' 5"  (1.651 m), weight 309 lb (140.161 kg), not currently breastfeeding.  Physical Examination: General appearance - alert, well appearing, and in no distress, oriented to person, place, and time and overweight Mental status - alert, oriented to person, place, and time, normal mood, behavior, speech, dress, motor activity, and thought processes Eyes - pupils equal and reactive, extraocular eye movements intact Chest - clear to auscultation, no wheezes, rales or rhonchi, symmetric air entry Heart - normal rate and regular rhythm Abdomen - soft, nontender, nondistended, no masses or organomegaly Exam limited by habitus. Pelvic - normal external genitalia, vulva, vagina, cervix, uterus and adnexa, VAGINA: normal appearing vagina with normal color and discharge, no lesions, PELVIC FLOOR EXAM: no cystocele, rectocele or prolapse noted Extremities - peripheral pulses normal, no pedal edema, no clubbing or cyanosis Skin - normal coloration and turgor, no rashes, no suspicious skin lesions noted  Anemic , pale , tolerating it well CBC Latest Ref Rng 12/20/2015 11/06/2015 11/05/2015  WBC 3.4 - 10.8 x10E3/uL 7.1 15.5(H) 18.8(H)  Hemoglobin 12.2 - 16.2 g/dL 5.9(A) 10.8(L) 11.6(L)  Hematocrit 34.0 - 46.6 %  20.7(L) 32.1(L) 34.7(L)  Platelets 150 - 379 x10E3/uL 572(H) 213 218     See u/s report Assessment & Plan:   A:  1. Retained tissue fragment after delivery, late postpartum bleeding due to retained tissue 2 anemia of chronic blood loss. P:  1. Cytotec tonight and tomorrow night to try to expel tissue 2 If unsuccessful will proceed with hysteroscopy ,Dilation and curettage on Thursday after transfusion 2 units prbc that morning.  3 NPO p mn Thursday. Will set up crossmatch tomorrow.

## 2015-12-22 ENCOUNTER — Observation Stay (HOSPITAL_COMMUNITY)
Admission: RE | Admit: 2015-12-22 | Discharge: 2015-12-23 | Disposition: A | Discharge status: Home / Self Care | Payer: Medicaid Other | Source: Ambulatory Visit | Attending: Obstetrics and Gynecology | Admitting: Obstetrics and Gynecology

## 2015-12-22 ENCOUNTER — Telehealth: Payer: Self-pay | Admitting: *Deleted

## 2015-12-22 ENCOUNTER — Encounter (HOSPITAL_COMMUNITY): Payer: Self-pay

## 2015-12-22 ENCOUNTER — Other Ambulatory Visit: Payer: Self-pay | Admitting: Obstetrics and Gynecology

## 2015-12-22 DIAGNOSIS — Z87891 Personal history of nicotine dependence: Secondary | ICD-10-CM | POA: Diagnosis not present

## 2015-12-22 DIAGNOSIS — I10 Essential (primary) hypertension: Secondary | ICD-10-CM | POA: Insufficient documentation

## 2015-12-22 DIAGNOSIS — Z79899 Other long term (current) drug therapy: Secondary | ICD-10-CM | POA: Insufficient documentation

## 2015-12-22 LAB — BASIC METABOLIC PANEL
Anion gap: 4 — ABNORMAL LOW (ref 5–15)
BUN: 17 mg/dL (ref 6–20)
CALCIUM: 8.4 mg/dL — AB (ref 8.9–10.3)
CO2: 26 mmol/L (ref 22–32)
CREATININE: 0.75 mg/dL (ref 0.44–1.00)
Chloride: 108 mmol/L (ref 101–111)
GFR calc Af Amer: >60 mL/min (ref >60)
GFR calc non Af Amer: >60 mL/min (ref >60)
GLUCOSE: 100 mg/dL — AB (ref 65–99)
Potassium: 4.2 mmol/L (ref 3.5–5.1)
Sodium: 138 mmol/L (ref 135–145)

## 2015-12-22 LAB — CBC
HEMATOCRIT: 17.8 % — AB (ref 36.0–46.0)
HEMATOCRIT: 26.1 % — AB (ref 36.0–46.0)
HEMOGLOBIN: 5.6 g/dL — AB (ref 12.0–15.0)
Hemoglobin: 8.5 g/dL — ABNORMAL LOW (ref 12.0–15.0)
MCH: 26.7 pg (ref 26.0–34.0)
MCH: 27.4 pg (ref 26.0–34.0)
MCHC: 31.5 g/dL (ref 30.0–36.0)
MCHC: 32.6 g/dL (ref 30.0–36.0)
MCV: 84.8 fL (ref 78.0–100.0)
MCV: 84.2 fL (ref 78.0–100.0)
PLATELETS: 481 10*3/uL — AB (ref 150–400)
Platelets: 467 10*3/uL — ABNORMAL HIGH (ref 150–400)
RBC: 2.1 MIL/uL — ABNORMAL LOW (ref 3.87–5.11)
RBC: 3.1 MIL/uL — ABNORMAL LOW (ref 3.87–5.11)
RDW: 16.7 % — AB (ref 11.5–15.5)
RDW: 15.6 % — AB (ref 11.5–15.5)
WBC: 7.8 10*3/uL (ref 4.0–10.5)
WBC: 10.3 10*3/uL (ref 4.0–10.5)

## 2015-12-22 LAB — ABO/RH: ABO/RH(D): A POS

## 2015-12-22 LAB — PREPARE RBC (CROSSMATCH)

## 2015-12-22 MED ORDER — CEFAZOLIN SODIUM 10 G IJ SOLR: 3.0000 g | INTRAMUSCULAR | Status: DC

## 2015-12-22 MED ORDER — SODIUM CHLORIDE 0.9 % IV SOLN
INTRAVENOUS | Status: DC
  Administered 2015-12-22: 22:00:00 via INTRAVENOUS

## 2015-12-22 MED ORDER — DEXTROSE 5 % IV SOLN
3.0000 g | INTRAVENOUS | Status: AC
  Filled 2015-12-22: qty 3000

## 2015-12-22 MED ORDER — SODIUM CHLORIDE 0.9 % IV SOLN
Freq: Once | INTRAVENOUS | Status: AC
  Administered 2015-12-23: 10:00:00 via INTRAVENOUS

## 2015-12-22 MED ORDER — SODIUM CHLORIDE 0.9 % IV SOLN: Freq: Once | INTRAVENOUS | Status: DC

## 2015-12-22 NOTE — Patient Instructions (Addendum)
Your procedure is scheduled on: 12/23/2015  Report to [facility] at   7:00  AM.  Call this number if you have problems the morning of surgery: [phone]   Remember:   Do not drink or eat food:After Midnight.  :  Take these medicines the morning of surgery with A SIP OF WATER: lisinopril and lexapro  Do not wear jewelry, make-up or nail polish.  Do not wear lotions, powders, or perfumes. You may wear deodorant.  Do not shave 48 hours prior to surgery. Men may shave face and neck.  Do not bring valuables to the hospital.  Contacts, dentures or bridgework may not be worn into surgery.  Leave suitcase in the car. After surgery it may be brought to your room.  For patients admitted to the hospital, checkout time is 11:00 AM the day of discharge.   Patients discharged the day of surgery will not be allowed to drive home.    Special Instructions: Shower using CHG night before surgery and shower the day of surgery use CHG.  Use special wash - you have one bottle of CHG for all showers.  You should use approximately 1/2 of the bottle for each shower. Hysteroscopy Hysteroscopy is a procedure used for looking inside the womb (uterus). It may be done for various reasons, including:  To evaluate abnormal bleeding, fibroid (benign, noncancerous) tumors, polyps, scar tissue (adhesions), and possibly cancer of the uterus.  To look for lumps (tumors) and other uterine growths.  To look for causes of why a woman cannot get pregnant (infertility), causes of recurrent loss of pregnancy (miscarriages), or a lost intrauterine device (IUD).  To perform a sterilization by blocking the fallopian tubes from inside the uterus. In this procedure, a thin, flexible tube with a tiny light and camera on the end of it (hysteroscope) is used to look inside the uterus. A hysteroscopy should be done right after a menstrual period to be sure you are not pregnant. LET [facility] CARE PROVIDER KNOW ABOUT:   Any  allergies you have.  All medicines you are taking, including vitamins, herbs, eye drops, creams, and over-the-counter medicines.  Previous problems you or members of your family have had with the use of anesthetics.  Any blood disorders you have.  Previous surgeries you have had.  Medical conditions you have. RISKS AND COMPLICATIONS  Generally, this is a safe procedure. However, as with any procedure, complications can occur. Possible complications include:  Putting a hole in the uterus.  Excessive bleeding.  Infection.  Damage to the cervix.  Injury to other organs.  Allergic reaction to medicines.  Too much fluid used in the uterus for the procedure. BEFORE THE PROCEDURE   Ask your health care provider about changing or stopping any regular medicines.  Do not take aspirin or blood thinners for 1 week before the procedure, or as directed by your health care provider. These can cause bleeding.  If you smoke, do not smoke for 2 weeks before the procedure.  In some cases, a medicine is placed in the cervix the day before the procedure. This medicine makes the cervix have a larger opening (dilate). This makes it easier for the instrument to be inserted into the uterus during the procedure.  Do not eat or drink anything for at least 8 hours before the surgery.  Arrange for someone to take you home after the procedure. PROCEDURE   You may be given a medicine to relax you (sedative). You may also  be given one of the following:  A medicine that numbs the area around the cervix (local anesthetic).  A medicine that makes you sleep through the procedure (general anesthetic).  The hysteroscope is inserted through the vagina into the uterus. The camera on the hysteroscope sends a picture to a TV screen. This gives the surgeon a good view inside the uterus.  During the procedure, air or a liquid is put into the uterus, which allows the surgeon to see better.  Sometimes, tissue  is gently scraped from inside the uterus. These tissue samples are sent to a lab for testing. AFTER THE PROCEDURE   If you had a general anesthetic, you may be groggy for a couple hours after the procedure.  If you had a local anesthetic, you will be able to go home as soon as you are stable and feel ready.  You may have some cramping. This normally lasts for a couple days.  You may have bleeding, which varies from light spotting for a few days to menstrual-like bleeding for 3-7 days. This is normal.  If your test results are not back during the visit, make an appointment with your health care provider to find out the results.   This information is not intended to replace advice given to you by your health care provider. Make sure you discuss any questions you have with your health care provider.   Document Released: 12/04/2000 Document Revised: 06/18/2013 Document Reviewed: 03/27/2013 Elsevier Interactive Patient Education 2016 Else[employer]  Dilation and Curettage, Care After These instructions give you information on caring for yourself after your procedure. Your doctor may also give you more specific instructions. Call your doctor if you have any problems or questions after your procedure. HOME CARE  Do not drive for 24 hours.  Wait 1 week before doing any activities that wear you out.  Take your temperature 2 times a day for 4 days. Write it down. Tell your doctor if you have a fever.  Do not stand for a long time.  Do not lift, push, or pull anything over 10 pounds (4.5 kilograms).  Limit stair climbing to once or twice a day.  Rest often.  Continue with your usual diet.  Drink enough fluids to keep your pee (urine) clear or pale yellow.  If you have a hard time pooping (constipation), you may:  Take a medicine to help you go poop (laxative) as told by your doctor.  Eat more fruit and bran.  Drink more fluids.  Take showers, not baths, for as long as told by  your doctor.  Do not swim or use a hot tub until your doctor says it is okay.  Have someone with you for 1-2 days after the procedure.  Do not douche, use tampons, or have sex (intercourse) for 2 weeks.  Only take medicines as told by your doctor. Do not take aspirin. It can cause bleeding.  Keep all doctor visits. GET HELP IF:  You have cramps or pain not helped by medicine.  You have new pain in the belly (abdomen).  You have a bad smelling fluid coming from your vagina.  You have a rash.  You have problems with any medicine. GET HELP RIGHT AWAY IF:   You start to bleed more than a regular period.  You have a fever.  You have chest pain.  You have trouble breathing.  You feel dizzy or feel like passing out (fainting).  You pass out.  You have pain  in the tops of your shoulders.  You have vaginal bleeding with or without clumps of blood (blood clots). MAKE SURE YOU:  Understand these instructions.  Will watch your condition.  Will get help right away if you are not doing well or get worse.   This information is not intended to replace advice given to you by your health care provider. Make sure you discuss any questions you have with your health care provider.   Document Released: 06/06/2008 Document Revised: 09/02/2013 Document Reviewed: 03/27/2013 Elsevier Interactive Patient Education [employer].

## 2015-12-22 NOTE — Progress Notes (Signed)
Dr. Glo Herring made aware of 5.6 Hgb.  MD was notified that 1 unit of pRBCs was transfused & just starting the 2nd unit.  MD stated that she will be needing a 3rd unit & needed to be admitted prior to surgery in the am of 12/23/15. Dr. Glo Herring spoke with pt. On the phone &  pt. Was ok with situation.  Orders given & carried out.  Pt. Taken to 302 & report was given to Fernande Bras, RN.

## 2015-12-22 NOTE — Telephone Encounter (Signed)
I spoke with Anderson Malta at [facility] blood bank, and she advised that the wrong order was placed for this pt. The order was put in for [facility] hospital and not for [facility] but it should be put in for [facility]. The order will be cancelled and Dr. Glo Herring will put in the correct order. Dr. Glo Herring is aware of this issue and aware of what needs to be done.

## 2015-12-23 ENCOUNTER — Encounter (HOSPITAL_COMMUNITY)
Admission: RE | Disposition: A | Discharge status: Home / Self Care | Payer: Self-pay | Source: Ambulatory Visit | Attending: Obstetrics and Gynecology

## 2015-12-23 ENCOUNTER — Encounter (HOSPITAL_COMMUNITY): Payer: Self-pay

## 2015-12-23 ENCOUNTER — Observation Stay (HOSPITAL_COMMUNITY): Payer: Medicaid Other | Admitting: Anesthesiology

## 2015-12-23 ENCOUNTER — Ambulatory Visit (HOSPITAL_COMMUNITY)
Admission: RE | Admit: 2015-12-23 | Payer: Medicaid Other | Source: Ambulatory Visit | Admitting: Obstetrics and Gynecology

## 2015-12-23 DIAGNOSIS — I10 Essential (primary) hypertension: Secondary | ICD-10-CM | POA: Diagnosis not present

## 2015-12-23 DIAGNOSIS — Z79899 Other long term (current) drug therapy: Secondary | ICD-10-CM | POA: Diagnosis not present

## 2015-12-23 DIAGNOSIS — D367 Benign neoplasm of other specified sites: Secondary | ICD-10-CM | POA: Diagnosis not present

## 2015-12-23 DIAGNOSIS — D500 Iron deficiency anemia secondary to blood loss (chronic): Secondary | ICD-10-CM | POA: Diagnosis not present

## 2015-12-23 DIAGNOSIS — Z87891 Personal history of nicotine dependence: Secondary | ICD-10-CM | POA: Diagnosis not present

## 2015-12-23 HISTORY — PX: HYSTEROSCOPY WITH D & C: SHX1775

## 2015-12-23 HISTORY — PX: EXCISION OF SKIN TAG: SHX6270

## 2015-12-23 LAB — HEMOGLOBIN AND HEMATOCRIT, BLOOD
HCT: 25.5 % — ABNORMAL LOW (ref 36.0–46.0)
HEMOGLOBIN: 8.3 g/dL — AB (ref 12.0–15.0)

## 2015-12-23 LAB — SURGICAL PCR SCREEN
MRSA, PCR: POSITIVE — AB
STAPHYLOCOCCUS AUREUS: POSITIVE — AB

## 2015-12-23 LAB — PREPARE RBC (CROSSMATCH)

## 2015-12-23 SURGERY — DILATATION AND CURETTAGE /HYSTEROSCOPY
Anesthesia: General

## 2015-12-23 MED ORDER — LACTATED RINGERS IV SOLN: INTRAVENOUS | Status: DC

## 2015-12-23 MED ORDER — DEXTROSE 5 % IV SOLN
3.0000 g | INTRAVENOUS | Status: DC | PRN
  Administered 2015-12-23: 3 g via INTRAVENOUS

## 2015-12-23 MED ORDER — GLYCOPYRROLATE 0.2 MG/ML IJ SOLN
INTRAMUSCULAR | Status: AC
  Filled 2015-12-23: qty 1

## 2015-12-23 MED ORDER — LIDOCAINE HCL (CARDIAC) 20 MG/ML IV SOLN
INTRAVENOUS | Status: DC | PRN
  Administered 2015-12-23: 25 mg via INTRAVENOUS

## 2015-12-23 MED ORDER — CEFAZOLIN SODIUM-DEXTROSE 2-4 GM/100ML-% IV SOLN
INTRAVENOUS | Status: AC
  Filled 2015-12-23: qty 100

## 2015-12-23 MED ORDER — BUTALBITAL-APAP-CAFFEINE 50-325-40 MG PO TABS
2.0000 | ORAL_TABLET | Freq: Once | ORAL | Status: AC
  Administered 2015-12-23: 2 via ORAL
  Filled 2015-12-23: qty 2

## 2015-12-23 MED ORDER — ONDANSETRON HCL 4 MG/2ML IJ SOLN: 4.0000 mg | Freq: Once | INTRAMUSCULAR | Status: DC | PRN

## 2015-12-23 MED ORDER — SODIUM CHLORIDE 0.9 % IR SOLN
Status: DC | PRN
  Administered 2015-12-23: 3000 mL

## 2015-12-23 MED ORDER — ONDANSETRON HCL 4 MG/2ML IJ SOLN
INTRAMUSCULAR | Status: AC
  Filled 2015-12-23: qty 2

## 2015-12-23 MED ORDER — FENTANYL CITRATE (PF) 100 MCG/2ML IJ SOLN
25.0000 ug | INTRAMUSCULAR | Status: DC | PRN
  Administered 2015-12-23 (×2): 50 ug via INTRAVENOUS

## 2015-12-23 MED ORDER — ONDANSETRON HCL 4 MG/2ML IJ SOLN
4.0000 mg | Freq: Once | INTRAMUSCULAR | Status: AC
  Administered 2015-12-23: 4 mg via INTRAVENOUS

## 2015-12-23 MED ORDER — FENTANYL CITRATE (PF) 100 MCG/2ML IJ SOLN
25.0000 ug | INTRAMUSCULAR | Status: AC
  Administered 2015-12-23: 25 ug via INTRAVENOUS

## 2015-12-23 MED ORDER — FENTANYL CITRATE (PF) 100 MCG/2ML IJ SOLN
INTRAMUSCULAR | Status: DC | PRN
  Administered 2015-12-23: 50 ug via INTRAVENOUS
  Administered 2015-12-23 (×2): 25 ug via INTRAVENOUS

## 2015-12-23 MED ORDER — FENTANYL CITRATE (PF) 100 MCG/2ML IJ SOLN
INTRAMUSCULAR | Status: AC
  Filled 2015-12-23: qty 2

## 2015-12-23 MED ORDER — PROPOFOL 10 MG/ML IV BOLUS
INTRAVENOUS | Status: DC | PRN
  Administered 2015-12-23: 180 mg via INTRAVENOUS

## 2015-12-23 MED ORDER — MUPIROCIN 2 % EX OINT
1.0000 "application " | TOPICAL_OINTMENT | Freq: Once | CUTANEOUS | Status: AC
  Administered 2015-12-23: 1 via TOPICAL

## 2015-12-23 MED ORDER — GLYCOPYRROLATE 0.2 MG/ML IJ SOLN
0.2000 mg | Freq: Once | INTRAMUSCULAR | Status: AC
Start: 2015-12-23 — End: 2015-12-23
  Administered 2015-12-23: 0.2 mg via INTRAVENOUS

## 2015-12-23 MED ORDER — MIDAZOLAM HCL 2 MG/2ML IJ SOLN
1.0000 mg | INTRAMUSCULAR | Status: DC | PRN
  Administered 2015-12-23 (×2): 2 mg via INTRAVENOUS

## 2015-12-23 MED ORDER — CEFAZOLIN SODIUM 1-5 GM-% IV SOLN
INTRAVENOUS | Status: AC
  Filled 2015-12-23: qty 50

## 2015-12-23 MED ORDER — MIDAZOLAM HCL 2 MG/2ML IJ SOLN
INTRAMUSCULAR | Status: AC
  Filled 2015-12-23: qty 2

## 2015-12-23 SURGICAL SUPPLY — 27 items
BAG HAMPER (MISCELLANEOUS) ×3 IMPLANT
CANISTER SUCT 3000ML PPV (MISCELLANEOUS) ×3 IMPLANT
CLOTH BEACON ORANGE TIMEOUT ST (SAFETY) ×3 IMPLANT
COVER LIGHT HANDLE STERIS (MISCELLANEOUS) ×6 IMPLANT
DECANTER SPIKE VIAL GLASS SM (MISCELLANEOUS) ×3 IMPLANT
FORMALIN 10 PREFIL 120ML (MISCELLANEOUS) ×5 IMPLANT
GLOVE BIOGEL PI IND STRL 7.0 (GLOVE) ×1 IMPLANT
GLOVE BIOGEL PI IND STRL 9 (GLOVE) ×1 IMPLANT
GLOVE BIOGEL PI INDICATOR 7.0 (GLOVE) ×2
GLOVE BIOGEL PI INDICATOR 9 (GLOVE) ×2
GLOVE ECLIPSE 6.5 STRL STRAW (GLOVE) ×2 IMPLANT
GLOVE ECLIPSE 9.0 STRL (GLOVE) ×3 IMPLANT
GLOVE EXAM NITRILE MD LF STRL (GLOVE) ×2 IMPLANT
GOWN SPEC L3 XXLG W/TWL (GOWN DISPOSABLE) ×6 IMPLANT
GOWN STRL REUS W/TWL LRG LVL3 (GOWN DISPOSABLE) ×3 IMPLANT
INST SET HYSTEROSCOPY (KITS) ×3 IMPLANT
IV NS 1000ML (IV SOLUTION)
IV NS 1000ML BAXH (IV SOLUTION) ×1 IMPLANT
IV NS IRRIG 3000ML ARTHROMATIC (IV SOLUTION) ×2 IMPLANT
KIT ROOM TURNOVER APOR (KITS) ×3 IMPLANT
MANIFOLD NEPTUNE II (INSTRUMENTS) ×3 IMPLANT
NS IRRIG 1000ML POUR BTL (IV SOLUTION) ×3 IMPLANT
PACK PERI GYN (CUSTOM PROCEDURE TRAY) ×3 IMPLANT
PAD ARMBOARD 7.5X6 YLW CONV (MISCELLANEOUS) ×3 IMPLANT
PAD TELFA 3X4 1S STER (GAUZE/BANDAGES/DRESSINGS) ×3 IMPLANT
SET BASIN LINEN APH (SET/KITS/TRAYS/PACK) ×3 IMPLANT
SET CYSTO W/LG BORE CLAMP LF (SET/KITS/TRAYS/PACK) ×3 IMPLANT

## 2015-12-23 NOTE — Progress Notes (Signed)
Down via wheelchair accompanied by staff for discharge in care of family.  Stable at discharge with no active bleeding or pain.

## 2015-12-23 NOTE — Discharge Instructions (Signed)
You must resume your thyroid medication. Not taking her thyroid medication slows your metabolism and leads to weight gain and other medical condition complications

## 2015-12-23 NOTE — Anesthesia Preprocedure Evaluation (Addendum)
Anesthesia Evaluation  Patient identified by MRN, date of birth, ID band Patient awake    Reviewed: Allergy & Precautions, NPO status , Patient's Chart, lab work & pertinent test results  Airway Mallampati: II  TM Distance: >3 FB     Dental  (+) Teeth Intact, Dental Advisory Given   Pulmonary former smoker,    breath sounds clear to auscultation       Cardiovascular hypertension, Pt. on medications  Rhythm:Regular Rate:Normal     Neuro/Psych PSYCHIATRIC DISORDERS Depression    GI/Hepatic   Endo/Other  Hypothyroidism (stopped taking meds) Morbid obesity  Renal/GU      Musculoskeletal   Abdominal   Peds  Hematology  (+) anemia , Severe anemia due to post partum bleeding, Hb=5.6 prior to transfusion.   Anesthesia Other Findings Transfused 2 PRBC prior to surgery.  Reproductive/Obstetrics                            Anesthesia Physical Anesthesia Plan  ASA: III  Anesthesia Plan: General   Post-op Pain Management:    Induction: Intravenous  Airway Management Planned: LMA  Additional Equipment:   Intra-op Plan:   Post-operative Plan: Extubation in OR  Informed Consent: I have reviewed the patients History and Physical, chart, labs and discussed the procedure including the risks, benefits and alternatives for the proposed anesthesia with the patient or authorized representative who has indicated his/her understanding and acceptance.     Plan Discussed with:   Anesthesia Plan Comments:         Anesthesia Quick Evaluation

## 2015-12-23 NOTE — H&P (Signed)
Progress Notes    Expand All Collapse All   Patient ID: [name], female DOB: [birth year], 35 y.o. MRN: [id number]  [facility] Visit  Patient name: Rondia Charley DiggsMRN [id number] Date of birth: 23-Jun-1981  CC & HPI:  Lorey A Rosenburg is a 35 y.o. female presenting today for late postpartum hemorrhage, with u/s suggesting a small fragment of retained tissue. Pt is having intermittent gushes of heavy bleeding, sometimes with clear liquid. , and anemia to Hgb 6.2 on central stick. Qhcg is returned to negative. The u/s suggests blood flow in the retained tissue, so a polyp is a possibilty.  Plans will be for proceeding with cytotec tonight and tomorrow, in hopes of expelling tissue, but proceeding with plans for Hysteroscopy and dilation and curettage on Thursday. Due to severity of anemia, will require transfusion prior to [facility]. Will arrange transfusion Thursday morning .   ROS:  Review of Systems  Constitutional: Negative for fever and chills.   .ros  Pertinent History Reviewed:  Reviewed: Significant for Prior postpartum hemorrhage required d&C after 2008 delivery 3 days after delivery . Medical  Past Medical History  Diagnosis Date  . Hypertension     chtn  . PIH (pregnancy induced hypertension)   . Pregnancy induced hypertension     history  . Thyroid disease   . Pregnant 03/22/2015  . HSV infection   . Smoker   . Mastitis 11/11/2015  . 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 Nausea and vomiting 11/11/2015    Surgical Hx:  Past Surgical History  Procedure Laterality Date  . Dilation and curettage of uterus    . Cholecystectomy  2013   Medications: Reviewed & Updated - see associated section   Current outpatient prescriptions:  . escitalopram (LEXAPRO) 10 MG tablet, Take 1 tablet (10 mg total) by mouth daily., Disp: 30  tablet, Rfl: 6 . lisinopril (PRINIVIL,ZESTRIL) 20 MG tablet, Take 20 mg by mouth daily., Disp: , Rfl:  . ferrous sulfate 325 (65 FE) MG tablet, Take 1 tablet (325 mg total) by mouth 3 (three) times daily with meals., Disp: 90 tablet, Rfl: 3 . misoprostol (CYTOTEC) 200 MCG tablet, Take 4 tablets (800 mcg total) by mouth daily., Disp: 8 tablet, Rfl: 0   Social History: Reviewed -  reports that she has quit smoking. Her smoking use included Cigarettes. She started smoking about 3 years ago. She smoked 0.25 packs per day. She has never used smokeless tobacco.  Objective Findings:  Vitals: Blood pressure 142/80, pulse 100, height 5\' 5"  (1.651 m), weight 309 lb (140.161 kg), not currently breastfeeding.  Physical Examination: General appearance - alert, well appearing, and in no distress, oriented to person, place, and time and overweight Mental status - alert, oriented to person, place, and time, normal mood, behavior, speech, dress, motor activity, and thought processes Eyes - pupils equal and reactive, extraocular eye movements intact Chest - clear to auscultation, no wheezes, rales or rhonchi, symmetric air entry Heart - normal rate and regular rhythm Abdomen - soft, nontender, nondistended, no masses or organomegaly Exam limited by habitus. Pelvic - normal external genitalia, vulva, vagina, cervix, uterus and adnexa, VAGINA: normal appearing vagina with normal color and discharge, no lesions, PELVIC FLOOR EXAM: no cystocele, rectocele or prolapse noted Extremities - peripheral pulses normal, no pedal edema, no clubbing or cyanosis Skin - normal coloration and turgor, no rashes, no suspicious skin lesions noted Anemic , pale , tolerating it well CBC Latest Ref Rng 12/20/2015 11/06/2015 11/05/2015  WBC 3.4 - 10.8 x10E3/uL 7.1 15.5(H) 18.8(H)  Hemoglobin 12.2 - 16.2 g/dL 5.9(A) 10.8(L) 11.6(L)  Hematocrit 34.0 - 46.6 % 20.7(L) 32.1(L) 34.7(L)  Platelets 150 - 379  x10E3/uL 572(H) 213 218     See u/s report Assessment & Plan:   A:  1. Retained tissue fragment after delivery, late postpartum bleeding due to retained tissue 2 anemia of chronic blood loss. P:  1. Cytotec tonight and tomorrow night to try to expel tissue 2 If unsuccessful will proceed with hysteroscopy ,Dilation and curettage on Thursday after transfusion 2 units prbc that morning.  3 NPO p mn Thursday. Will set up crossmatch tomorrow.

## 2015-12-23 NOTE — Anesthesia Procedure Notes (Signed)
Procedure Name: LMA Insertion Date/Time: 12/23/2015 11:31 AM Performed by: Andree Elk, AMY A Pre-anesthesia Checklist: Patient identified, Timeout performed, Emergency Drugs available, Suction available and Patient being monitored Patient Re-evaluated:Patient Re-evaluated prior to inductionOxygen Delivery Method: Circle system utilized Preoxygenation: Pre-oxygenation with 100% oxygen Intubation Type: IV induction Ventilation: Mask ventilation without difficulty LMA: LMA inserted LMA Size: 4.0 Number of attempts: 1 Tube secured with: Tape Dental Injury: Teeth and Oropharynx as per pre-operative assessment

## 2015-12-23 NOTE — OR Nursing (Signed)
Per becky in lab [place] has  2 units available.

## 2015-12-23 NOTE — Progress Notes (Signed)
Down to OR via wheelchair, IV fluids taken off pump for transport, patient called family and notified of procedure and time.

## 2015-12-23 NOTE — Anesthesia Postprocedure Evaluation (Signed)
Anesthesia Post Note  Patient: [name]  Procedure(s) Performed: Procedure(s) (LRB): DILATATION AND CURETTAGE /HYSTEROSCOPY (N/A)  Patient location during evaluation: PACU Anesthesia Type: General Level of consciousness: awake and alert and oriented Pain management: pain level controlled Vital Signs Assessment: post-procedure vital signs reviewed and stable Respiratory status: spontaneous breathing and patient connected to face mask oxygen Cardiovascular status: stable Postop Assessment: no signs of nausea or vomiting Anesthetic complications: no    Last Vitals:  Filed Vitals:   12/23/15 1115 12/23/15 1120  BP: 120/89   Pulse:    Temp:    Resp: 15 32    Last Pain: There were no vitals filed for this visit.               ADAMS, AMY A

## 2015-12-23 NOTE — Transfer of Care (Signed)
Immediate Anesthesia Transfer of Care Note  Patient: [name]  Procedure(s) Performed: Procedure(s): DILATATION AND CURETTAGE /HYSTEROSCOPY (N/A)  Patient Location: PACU  Anesthesia Type:General  Level of Consciousness: awake, alert , oriented and patient cooperative  Airway & Oxygen Therapy: Patient Spontanous Breathing and Patient connected to face mask oxygen  Post-op Assessment: Report given to RN and Post -op Vital signs reviewed and stable  Post vital signs: Reviewed and stable  Last Vitals:  Filed Vitals:   12/23/15 1115 12/23/15 1120  BP: 120/89   Pulse:    Temp:    Resp: 15 32    Complications: No apparent anesthesia complications

## 2015-12-23 NOTE — Progress Notes (Signed)
Patient complained of head ache, paged on call MD, will follow new orders received and continue to monitor the patient.

## 2015-12-23 NOTE — Progress Notes (Signed)
Received phone call from lab micro, patient's MRSA screen positive, also SA positive, pre-op called and notified, placed on contact precautions

## 2015-12-23 NOTE — Brief Op Note (Addendum)
12/22/2015 - 12/23/2015  12:13 PM  PATIENT:  [name]  35 y.o. female  PRE-OPERATIVE DIAGNOSIS:  retained products of pregnancy, late postpartum hemorrhage with anemia  POST-OPERATIVE DIAGNOSIS:  retained products of pregnancy, late postpartum hemorrhage with anemia, gluteal crease skin tag  PROCEDURE:  Procedure(s): DILATATION AND CURETTAGE /HYSTEROSCOPY (N/A) excision of gluteal crease skin tag   SURGEON:  Surgeon(s) and Role:    * Jonnie Kind, MD - Primary  PHYSICIAN ASSISTANT:   ASSISTANTS: None   ANESTHESIA:   general and LMA  EBL:  Total I/O In: 700 [I.V.:700] Out: 200 [Blood:200]  BLOOD ADMINISTERED:none  DRAINS: none   LOCAL MEDICATIONS USED:  NONE  SPECIMEN:  1 Endometrial curettings  2 gluteal crease skin tag  DISPOSITION OF SPECIMEN:  PATHOLOGY  COUNTS:  YES  TOURNIQUET:  * No tourniquets in log *  DICTATION: .Dragon Dictation  PLAN OF CARE: Discharge to home after PACU  PATIENT DISPOSITION:  PACU - hemodynamically stable.   Delay start of Pharmacological VTE agent (>24hrs) due to surgical blood loss or risk of bleeding: not applicable Indications ultrasound identified retained tissue fragment, with internal flow suggestive of viable tissue, with symptomatic anemia with hemoglobin of 5.6 down from greater than 11, recurring transfusion 3 units packed cells preop. Details of procedure patient was taken operating room prepped and draped for vaginal procedure with timeout conducted and procedure confirmed by operative team. Ancef was administered. 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 This was considered abnormal and warranting excision for diagnosis.  Speculum was inserted, cervix visualized and uterus sounded to 11 cm in the anteflexed position with dilation to 25 [place] Introduction of the 23 [place] operative hysteroscope allowed  visualization of some bloody tissue in the uterus and some apparently viable tissue on the patient's right side. Smooth sharp curettage and only got as a few tiny fragments of Randall stone forceps were used to probe the endometrial cavity and extracted and large finger-sized 2 cm long fresh living tissue from the endometrial cavity. The cervix was then dilated to 66 [place] allowing introduction of a larger curette and a smooth uniform tissue feel was achieved. Repeat hysteroscopy showed no residual tissue chunks Specimen was passed off the table. The skin tag was then sharply excised with the Bovie and sent as a specimen    patient was then allowed to awaken and go to recovery in stable condition

## 2015-12-23 NOTE — Progress Notes (Addendum)
Discharge instructions reviewed with patient and family, questions answered, understanding verbalized. Peripad given patient per request.  Getting dressed.  Transportation available with family in room.

## 2015-12-23 NOTE — Interval H&P Note (Signed)
History and Physical Interval Note:  12/23/2015 11:13 AM  [name]  has presented today for surgery, with the diagnosis of retained products of pregnancy, late postpartum hemorrhage with anemia  The various methods of treatment have been discussed with the patient and family. After consideration of risks, benefits and other options for treatment, the patient has consented to  Procedure(s): DILATATION AND CURETTAGE /HYSTEROSCOPY (N/A) as a surgical intervention .  The patient's history has been reviewed, patient examined, no change in status, stable for surgery.  I have reviewed the patient's chart and labs.  Questions were answered to the patient's satisfaction.   The patient has received 3 units prbc, with Hgb now 8.5 , satisfactory for surgery planne.d   [name]

## 2015-12-23 NOTE — Progress Notes (Signed)
Received report from PACU, tissue removed, did not require blood products, alert and tolerating ginger ale, no active bleeding, received 100 mcg Fentanyl total and dening pain, will be discharged later today.

## 2015-12-23 NOTE — Progress Notes (Signed)
Returned from PACU via wheelchair, assisted to bed, smear of blood on pad in wheelchair S/P removal skin tag from rectum, alert and oriented but states "I am sleepy".  Scheduled for discharge, called for family to transport home, resting at present, will discharge when family arrives.

## 2015-12-24 LAB — TYPE AND SCREEN
ABO/RH(D): A POS
Antibody Screen: NEGATIVE
UNIT DIVISION: 0
UNIT DIVISION: 0
UNIT DIVISION: 0
Unit division: 0
Unit division: 0

## 2015-12-24 NOTE — Op Note (Signed)
Please see brief op note for surgical details. 

## 2015-12-27 ENCOUNTER — Encounter (HOSPITAL_COMMUNITY): Payer: Self-pay | Admitting: Obstetrics and Gynecology

## 2015-12-28 ENCOUNTER — Other Ambulatory Visit: Payer: Self-pay | Admitting: Women's Health

## 2015-12-30 ENCOUNTER — Encounter: Payer: PRIVATE HEALTH INSURANCE | Admitting: Obstetrics and Gynecology

## 2015-12-31 ENCOUNTER — Encounter: Payer: Self-pay | Admitting: Obstetrics and Gynecology

## 2015-12-31 ENCOUNTER — Ambulatory Visit (INDEPENDENT_AMBULATORY_CARE_PROVIDER_SITE_OTHER): Payer: PRIVATE HEALTH INSURANCE | Admitting: Obstetrics and Gynecology

## 2015-12-31 DIAGNOSIS — D500 Iron deficiency anemia secondary to blood loss (chronic): Secondary | ICD-10-CM | POA: Diagnosis not present

## 2015-12-31 DIAGNOSIS — Z5189 Encounter for other specified aftercare: Secondary | ICD-10-CM

## 2015-12-31 NOTE — Progress Notes (Signed)
   Subjective:  [name] is a 35 y.o. female now 1 weeks status post D&C Hysteroscopy. For Retained POC's removing a 1.5 cm area of still viable tissue remnants. Pt had been bleeding since delivery. Pt required transfusion x 3 units prior to procedure due to anemia to HGB 5.9. Postop bleeding has been minimal. preoP preg test was negative.    Review of Systems Negative except    Diet:     Bowel movements : normal.  The patient is not having any pain.  Objective:  BP 146/98 mmHg  Pulse 80  Wt 313 lb 12.8 oz (142.339 kg)  LMP 12/22/2015  Breastfeeding? No General:Well developed, well nourished.  No acute distress. Abdomen: Bowel sounds normal, soft, non-tender. Pelvic Exam:not required; pt reports bleeding resolved.    External Genitalia:      Vagina:     Cervix:     Uterus:     Adnexa/Bimanual:    Assessment:  Post-Op 1 weeks s/p D&C hysteroscopy       Resolved retained POC's s/pvag delivery   Doing well postoperatively.   Plan:  1.Wound care discussed  2. . current medications. 3. Activity restrictions: none 4. return to work: not applicable. 5. Follow up in PRN .  I personally performed the services described in this documentation, which was SCRIBED in my presence. The recorded information has been reviewed and considered accurate. It has been edited as necessary during review. Shelly Kind, MD     By signing my name below, I, [name], attest that this documentation has been prepared under the direction and in the presence of Shelly Kind, MD. Electronically Signed: [name], ED Scribe. 12/31/2015. 12:56 PM.

## 2016-01-04 NOTE — Discharge Summary (Signed)
Physician Discharge Summary  Patient ID: [name] MRN: [id number] DOB/AGE: [birth year] 35 y.o.  Admit date: 12/22/2015 \ disch date 4/13 Admission Diagnoses:  Discharge Diagnoses:  Late postpartum hemorrhage, retained POC's/ Gluteal crease skin tag.,excised.  Discharged Condition: good  Hospital Course:  CC & HPI:  [name] is a 35 y.o. female presenting today for late postpartum hemorrhage, with u/s suggesting a small fragment of retained tissue. Pt is having intermittent gushes of heavy bleeding, sometimes with clear liquid. , and anemia to Hgb 6.2 on central stick. Qhcg is returned to negative. The u/s suggests blood flow in the retained tissue, so a polyp is a possibilty.  Plans will be for proceeding with cytotec tonight and tomorrow, in hopes of expelling tissue, but proceeding with plans for Hysteroscopy and dilation and curettage on Thursday. Due to severity of anemia, will require transfusion prior to [facility]. Will arrange transfusion over night the day before sugery.          The patient has received 3 units prbc,overninght the eve before surgery with Hgb now 8.5 , satisfactory for surgery planned  Consults: None  Significant Diagnostic Studies: labs:Marland Kitchen  CBC Latest Ref Rng 12/23/2015 12/22/2015 12/22/2015  WBC 4.0 - 10.5 K/uL - 10.3 7.8  Hemoglobin 12.0 - 15.0 g/dL 8.3(L) 8.5(L) 5.6(LL)  Hematocrit 36.0 - 46.0 % 25.5(L) 26.1(L) 17.8(L)  Platelets 150 - 400 K/uL - 481(H) 467(H)     and transfusion 3 units prbc  Treatments: surgery: hysteroscopy dilation and curettage, plus excision of gluteal creas3e skin tag.  Discharge Exam: Blood pressure 129/93, pulse 70, temperature 97.9 F (36.6 C), temperature source Oral, resp. rate 18, height 5\' 5"  (1.651 m), weight 312 lb (141.522 kg), last menstrual period 12/22/2015, SpO2 100 %, not currently breastfeeding. General appearance: alert, cooperative, appears stated age and mild distress Head: Normocephalic,  without obvious abnormality, atraumatic GI: soft, non-tender; bowel sounds normal; no masses,  no organomegaly Pelvic: cervix normal in appearance, external genitalia normal, no adnexal masses or tenderness, no cervical motion tenderness, uterus normal size, shape, and consistency and vagina normal without discharge  Disposition: 01-Home or Self Care  Discharge Instructions    Call MD for:  severe uncontrolled pain    Complete by:  As directed      Call MD for:  temperature >100.4    Complete by:  As directed      Call MD for:    Complete by:  As directed   Heavy bleeding of more than a period. IT is important that she resume taking her thyroid medication     Diet - low sodium heart healthy    Complete by:  As directed      Increase activity slowly    Complete by:  As directed             Medication List    STOP taking these medications        levothyroxine 75 MCG tablet  Commonly known as:  SYNTHROID, LEVOTHROID     misoprostol 200 MCG tablet  Commonly known as:  CYTOTEC      TAKE these medications        escitalopram 10 MG tablet  Commonly known as:  LEXAPRO  Take 1 tablet (10 mg total) by mouth daily.     ferrous sulfate 325 (65 FE) MG tablet  Take 1 tablet (325 mg total) by mouth 3 (three) times daily with meals.     HYDROcodone-acetaminophen 5-325 MG tablet  Commonly  known as:  NORCO/VICODIN  Take 1 tablet by mouth every 6 (six) hours as needed.     lisinopril 20 MG tablet  Commonly known as:  PRINIVIL,ZESTRIL  Take 20 mg by mouth daily.           Follow-up Information    Follow up with Jonnie Kind, MD In 1 week.   Specialties:  Obstetrics and Gynecology, Radiology   Why:  postpartum visit, Postoperative visit   Contact information:   [place] [id number] [phone]       Signed: Jonnie Kind 01/04/2016, 4:51 PM

## 2016-01-10 ENCOUNTER — Ambulatory Visit (INDEPENDENT_AMBULATORY_CARE_PROVIDER_SITE_OTHER): Payer: PRIVATE HEALTH INSURANCE | Admitting: Adult Health

## 2016-01-10 ENCOUNTER — Encounter: Payer: Self-pay | Admitting: Adult Health

## 2016-01-10 VITALS — BP 120/80 | HR 80 | Ht 65.0 in | Wt 312.0 lb

## 2016-01-10 DIAGNOSIS — Z3009 Encounter for other general counseling and advice on contraception: Secondary | ICD-10-CM | POA: Diagnosis not present

## 2016-01-10 HISTORY — DX: Encounter for other general counseling and advice on contraception: Z30.09

## 2016-01-10 NOTE — Patient Instructions (Signed)
No sex  Return in 1 week for stat QHCG in am and nexplanon  In pm with me

## 2016-01-10 NOTE — Progress Notes (Signed)
Subjective:     Patient ID: [name], female   DOB: 1981/01/23, 35 y.o.   MRN: [id number]  HPI [name] is a 35 year old black female in to discuss birth control.   Review of Systems Patient denies any headaches, hearing loss, fatigue, blurred vision, shortness of breath, chest pain, abdominal pain, problems with bowel movements, urination, or intercourse. No joint pain or mood swings. Reviewed past medical,surgical, social and family history. Reviewed medications and allergies.     Objective:   Physical Exam BP 120/80 mmHg  Pulse 80  Ht 5\' 5"  (1.651 m)  Wt 312 lb (141.522 kg)  BMI 51.92 kg/m2  LMP 12/22/2015  Breastfeeding? No Discussed mirena IUD and nexplanon and depo and nuva ring, and their pros and cons and she wants the nexplanon.Last sex 01/02/16.Since she smokes, progesterone only is good choice.  Face time 10 minutes, counseling and discussing options.     Assessment:     Contraceptive ed and counseling     Plan:     No sex Return in 1 week for stat QHCG in am and nexplanon insertion with me in pm Angie to check benefits

## 2016-01-20 ENCOUNTER — Other Ambulatory Visit: Payer: PRIVATE HEALTH INSURANCE

## 2016-01-20 ENCOUNTER — Ambulatory Visit (INDEPENDENT_AMBULATORY_CARE_PROVIDER_SITE_OTHER): Payer: PRIVATE HEALTH INSURANCE | Admitting: Adult Health

## 2016-01-20 ENCOUNTER — Encounter: Payer: Self-pay | Admitting: Adult Health

## 2016-01-20 VITALS — BP 140/70 | HR 86 | Ht 65.0 in | Wt 314.0 lb

## 2016-01-20 DIAGNOSIS — Z3046 Encounter for surveillance of implantable subdermal contraceptive: Secondary | ICD-10-CM

## 2016-01-20 DIAGNOSIS — R599 Enlarged lymph nodes, unspecified: Secondary | ICD-10-CM

## 2016-01-20 DIAGNOSIS — Z3009 Encounter for other general counseling and advice on contraception: Secondary | ICD-10-CM | POA: Insufficient documentation

## 2016-01-20 DIAGNOSIS — R591 Generalized enlarged lymph nodes: Secondary | ICD-10-CM | POA: Diagnosis not present

## 2016-01-20 DIAGNOSIS — Z3202 Encounter for pregnancy test, result negative: Secondary | ICD-10-CM

## 2016-01-20 DIAGNOSIS — Z30017 Encounter for initial prescription of implantable subdermal contraceptive: Secondary | ICD-10-CM

## 2016-01-20 HISTORY — DX: Encounter for initial prescription of implantable subdermal contraceptive: Z30.017

## 2016-01-20 HISTORY — DX: Enlarged lymph nodes, unspecified: R59.9

## 2016-01-20 LAB — POCT URINE PREGNANCY: Preg Test, Ur: NEGATIVE

## 2016-01-20 NOTE — Progress Notes (Signed)
Subjective:     Patient ID: [name], female   DOB: 15-Dec-1980, 35 y.o.   MRN: [id number]  HPI [name] is a 35 year old black female, married in for nexplanon insertion and has lump on neck since deliver, no pain, no known injury or infection.  Review of Systems For nexplanon insertion +lump on right side of neck, non painful  Reviewed past medical,surgical, social and family history. Reviewed medications and allergies.     Objective:   Physical Exam BP 140/70 mmHg  Pulse 86  Ht 5\' 5"  (1.651 m)  Wt 314 lb (142.429 kg)  BMI 52.25 kg/m2  LMP 01/15/2016  Breastfeeding? No UPT negative, Consent signed, time out called. Left arm cleansed with betadine, and injected with 1.5 cc 2% lidocaine and waited til numb. Nexplanon easily inserted and steri strips applied.Rod easily palpated by provider and pt. Pressure dressing applied.    Skin warm and dry, no rashes or lesions. Has 1 cm round, non tender swollen lymph node behind right ear, in hair line.  Assessment:     Nexplanon insertion lot # [id number] exp 5/19 Swollen lymph node in neck     Plan:     Use condoms x 2 weeks, keep clean and dry x 24 hours, no heavy lifting, keep steri strips on x 72 hours, Keep pressure dressing on x 24 hours. Follow up prn problems. Follow up in 4 weeks to recheck neck,for swollen node.

## 2016-01-20 NOTE — Patient Instructions (Signed)
Use condoms x 2 weeks, keep clean and dry x 24 hours, no heavy lifting, keep steri strips on x 72 hours, Keep pressure dressing on x 24 hours. Follow up prn problems. Recheck neck in 4 weeks

## 2016-02-14 ENCOUNTER — Other Ambulatory Visit: Payer: Self-pay | Admitting: Women's Health

## 2016-02-17 ENCOUNTER — Ambulatory Visit: Payer: PRIVATE HEALTH INSURANCE | Admitting: Adult Health

## 2016-02-17 ENCOUNTER — Encounter: Payer: Self-pay | Admitting: *Deleted

## 2016-03-02 ENCOUNTER — Ambulatory Visit: Payer: PRIVATE HEALTH INSURANCE | Admitting: Obstetrics & Gynecology

## 2016-06-12 ENCOUNTER — Other Ambulatory Visit (HOSPITAL_COMMUNITY)
Admission: RE | Admit: 2016-06-12 | Discharge: 2016-06-12 | Disposition: A | Discharge status: Home / Self Care | Payer: PRIVATE HEALTH INSURANCE | Source: Ambulatory Visit | Attending: Adult Health | Admitting: Adult Health

## 2016-06-12 ENCOUNTER — Encounter: Payer: Self-pay | Admitting: Adult Health

## 2016-06-12 ENCOUNTER — Ambulatory Visit (INDEPENDENT_AMBULATORY_CARE_PROVIDER_SITE_OTHER): Payer: PRIVATE HEALTH INSURANCE | Admitting: Adult Health

## 2016-06-12 VITALS — BP 170/100 | HR 74 | Ht 65.0 in | Wt 332.5 lb

## 2016-06-12 DIAGNOSIS — Z308 Encounter for other contraceptive management: Secondary | ICD-10-CM | POA: Diagnosis not present

## 2016-06-12 DIAGNOSIS — Z01419 Encounter for gynecological examination (general) (routine) without abnormal findings: Secondary | ICD-10-CM

## 2016-06-12 DIAGNOSIS — Z975 Presence of (intrauterine) contraceptive device: Secondary | ICD-10-CM

## 2016-06-12 DIAGNOSIS — Z01411 Encounter for gynecological examination (general) (routine) with abnormal findings: Secondary | ICD-10-CM | POA: Diagnosis not present

## 2016-06-12 DIAGNOSIS — R635 Abnormal weight gain: Secondary | ICD-10-CM | POA: Diagnosis not present

## 2016-06-12 DIAGNOSIS — Z1151 Encounter for screening for human papillomavirus (HPV): Secondary | ICD-10-CM | POA: Diagnosis present

## 2016-06-12 DIAGNOSIS — Z113 Encounter for screening for infections with a predominantly sexual mode of transmission: Secondary | ICD-10-CM | POA: Insufficient documentation

## 2016-06-12 DIAGNOSIS — R6882 Decreased libido: Secondary | ICD-10-CM | POA: Diagnosis not present

## 2016-06-12 DIAGNOSIS — I10 Essential (primary) hypertension: Secondary | ICD-10-CM | POA: Diagnosis not present

## 2016-06-12 DIAGNOSIS — Z3009 Encounter for other general counseling and advice on contraception: Secondary | ICD-10-CM

## 2016-06-12 DIAGNOSIS — T50905A Adverse effect of unspecified drugs, medicaments and biological substances, initial encounter: Secondary | ICD-10-CM

## 2016-06-12 MED ORDER — TRIAMTERENE-HCTZ 37.5-25 MG PO TABS: 1.0000 | ORAL_TABLET | Freq: Every day | ORAL | 6 refills | Status: DC

## 2016-06-12 NOTE — Progress Notes (Signed)
Patient ID: [name], female   DOB: Jul 13, 1981, 35 y.o.   MRN: [id number] History of Present Illness: Tzipora is a 35 year old black female, married in for well woman gyn exam and pap, has Family planning medicaid. Has been without BP meds for 2 weeks and has some swelling in feet at end of day. Has gained weight and has decreased sex drive, she wonders if its the nexplanon or her thyroid,she does not have PCP.   Current Medications, Allergies, Past Medical History, Past Surgical History, Family History and Social History were reviewed in [employer] record.     Review of Systems: Patient denies any headaches, hearing loss, fatigue, blurred vision, shortness of breath, chest pain, abdominal pain, problems with bowel movements, urination, or intercourse. No joint pain or mood swings. See HPI for positives.    Physical Exam: General:  Well developed, well nourished, no acute distress Skin:  Warm and dry Neck:  Midline trachea, normal thyroid, good ROM, no lymphadenopathy Lungs; Clear to auscultation bilaterally Breast:  No dominant palpable mass, retraction, or nipple discharge Cardiovascular: Regular rate and rhythm Abdomen:  Soft, non tender, no hepatosplenomegaly Pelvic:  External genitalia is normal in appearance, no lesions.  The vagina is normal in appearance. Urethra has no lesions or masses. The cervix is bulbous. Pap with HPV and GC/CHL performed. Uterus is felt to be normal size, shape, and contour.  No adnexal masses or tenderness noted.Bladder is non tender, no masses felt. Extremities/musculoskeletal:  No swelling or varicosities noted, no clubbing or cyanosis,nexplanon palpated left arm Psych:  No mood changes, alert and cooperative,seems happy PHQ 2 score 0  Impression: 1. Encounter for gynecological examination with Papanicolaou smear of cervix   2. Family planning   3. Essential hypertension   4. Weight gain due to medication   5. Decreased  libido   6. Nexplanon in place       Plan: Check CBC,CMP,TSH and lipids,A1c and vitamin D and HIV and RPR Meds ordered this encounter  Medications  . triamterene-hydrochlorothiazide (MAXZIDE-25) 37.5-25 MG tablet    Sig: Take 1 tablet by mouth daily.    Dispense:  30 tablet    Refill:  6    Order Specific Question:   Supervising Provider    Answer:   Tania Ade H [[id number]]   Check BP in 4 weeks Physical in 1 year, pap in 3 if normal  Decrease carbs Take BP MEDS Call Dr Meda Coffee as PCP

## 2016-06-12 NOTE — Patient Instructions (Signed)
Check BP in 4 weeks Physical in 1 year, pap in 3 if normal  Decrease carbs Take BP MEDS Call Dr Meda Coffee

## 2016-06-13 ENCOUNTER — Telehealth: Payer: Self-pay | Admitting: Adult Health

## 2016-06-13 ENCOUNTER — Encounter: Payer: Self-pay | Admitting: Adult Health

## 2016-06-13 DIAGNOSIS — E559 Vitamin D deficiency, unspecified: Secondary | ICD-10-CM

## 2016-06-13 HISTORY — DX: Vitamin D deficiency, unspecified: E55.9

## 2016-06-13 LAB — CBC
HEMOGLOBIN: 12.1 g/dL (ref 11.1–15.9)
Hematocrit: 38.4 % (ref 34.0–46.6)
MCH: 25.4 pg — AB (ref 26.6–33.0)
MCHC: 31.5 g/dL (ref 31.5–35.7)
MCV: 81 fL (ref 79–97)
Platelets: 310 10*3/uL (ref 150–379)
RBC: 4.77 x10E6/uL (ref 3.77–5.28)
RDW: 18 % — ABNORMAL HIGH (ref 12.3–15.4)
WBC: 8.5 10*3/uL (ref 3.4–10.8)

## 2016-06-13 LAB — COMPREHENSIVE METABOLIC PANEL
A/G RATIO: 1.2 (ref 1.2–2.2)
ALBUMIN: 3.5 g/dL (ref 3.5–5.5)
ALT: 19 IU/L (ref 0–32)
AST: 17 IU/L (ref 0–40)
Alkaline Phosphatase: 84 IU/L (ref 39–117)
BILIRUBIN TOTAL: 0.2 mg/dL (ref 0.0–1.2)
BUN / CREAT RATIO: 19 (ref 9–23)
BUN: 15 mg/dL (ref 6–20)
CHLORIDE: 105 mmol/L (ref 96–106)
CO2: 26 mmol/L (ref 18–29)
Calcium: 9.2 mg/dL (ref 8.7–10.2)
Creatinine, Ser: 0.77 mg/dL (ref 0.57–1.00)
GFR calc non Af Amer: 101 mL/min/{1.73_m2} (ref >59)
GFR, EST AFRICAN AMERICAN: 117 mL/min/{1.73_m2} (ref >59)
GLOBULIN, TOTAL: 2.9 g/dL (ref 1.5–4.5)
Glucose: 79 mg/dL (ref 65–99)
POTASSIUM: 4.2 mmol/L (ref 3.5–5.2)
SODIUM: 142 mmol/L (ref 134–144)
TOTAL PROTEIN: 6.4 g/dL (ref 6.0–8.5)

## 2016-06-13 LAB — CYTOLOGY - PAP

## 2016-06-13 LAB — LIPID PANEL
CHOL/HDL RATIO: 2.8 ratio (ref 0.0–4.4)
Cholesterol, Total: 153 mg/dL (ref 100–199)
HDL: 54 mg/dL (ref >39)
LDL Calculated: 75 mg/dL (ref 0–99)
Triglycerides: 121 mg/dL (ref 0–149)
VLDL CHOLESTEROL CAL: 24 mg/dL (ref 5–40)

## 2016-06-13 LAB — HEMOGLOBIN A1C
Est. average glucose Bld gHb Est-mCnc: 108 mg/dL
Hgb A1c MFr Bld: 5.4 % (ref 4.8–5.6)

## 2016-06-13 LAB — HIV ANTIBODY (ROUTINE TESTING W REFLEX): HIV Screen 4th Generation wRfx: NONREACTIVE

## 2016-06-13 LAB — RPR: RPR Ser Ql: NONREACTIVE

## 2016-06-13 LAB — TSH: TSH: 2.91 u[IU]/mL (ref 0.450–4.500)

## 2016-06-13 LAB — VITAMIN D 25 HYDROXY (VIT D DEFICIENCY, FRACTURES): VIT D 25 HYDROXY: 18.8 ng/mL — AB (ref 30.0–100.0)

## 2016-06-13 MED ORDER — METRONIDAZOLE 500 MG PO TABS: 500.0000 mg | ORAL_TABLET | Freq: Two times a day (BID) | ORAL | 0 refills | Status: DC

## 2016-06-13 MED ORDER — CHOLECALCIFEROL 125 MCG (5000 UT) PO CAPS: 5000.0000 [IU] | ORAL_CAPSULE | Freq: Every day | ORAL | Status: DC

## 2016-06-13 NOTE — Telephone Encounter (Signed)
Pt aware that labs are good, except vitamin D low at 18.8 take vitamin D3 5000 IUD daily

## 2016-06-13 NOTE — Telephone Encounter (Signed)
Not available

## 2016-06-13 NOTE — Telephone Encounter (Signed)
Pt aware pap normal but +BV will rx flagyl

## 2016-07-10 ENCOUNTER — Ambulatory Visit: Payer: PRIVATE HEALTH INSURANCE | Admitting: Adult Health

## 2016-07-12 ENCOUNTER — Ambulatory Visit (INDEPENDENT_AMBULATORY_CARE_PROVIDER_SITE_OTHER): Payer: PRIVATE HEALTH INSURANCE | Admitting: Adult Health

## 2016-07-12 ENCOUNTER — Encounter: Payer: Self-pay | Admitting: Adult Health

## 2016-07-12 VITALS — BP 138/100 | HR 80 | Ht 65.0 in | Wt 329.4 lb

## 2016-07-12 DIAGNOSIS — I10 Essential (primary) hypertension: Secondary | ICD-10-CM

## 2016-07-12 DIAGNOSIS — R51 Headache: Secondary | ICD-10-CM | POA: Diagnosis not present

## 2016-07-12 MED ORDER — AMLODIPINE BESYLATE 5 MG PO TABS: 5.0000 mg | ORAL_TABLET | Freq: Every day | ORAL | 3 refills | Status: DC

## 2016-07-12 NOTE — Progress Notes (Signed)
Subjective:     Patient ID: [name], female   DOB: [birth year], 35 y.o.   MRN: [id number]  HPI Beadie is a 35 year old black female in for BP check, occasional headache.She says husband gets on her nerves.   Review of Systems Patient denies any hearing loss, fatigue, blurred vision, shortness of breath, chest pain, abdominal pain, problems with bowel movements, urination, or intercourse. No joint pain or mood swings.+ headache at times Reviewed past medical,surgical, social and family history. Reviewed medications and allergies.     Objective:   Physical Exam BP (!) 138/100 (BP Location: Right Arm, Patient Position: Sitting, Cuff Size: Large)   Pulse 80   Ht 5\' 5"  (1.651 m)   Wt (!) 329 lb 6.4 oz (149.4 kg)   LMP 06/12/2016   Breastfeeding? No   BMI 54.82 kg/m  Skin warm and dry.Lungs: clear to ausculation bilaterally. Cardiovascular: regular rate and rhythm.Has lost about 3 lbs. PHQ 2 score 0. Will norvasc to losartan, and recheck in 4 weeks.    Assessment:     1. Essential hypertension       Plan:     Continue losartan  Rx norvasc 5 mg #30 take 1 daily with 3 refills Decrease salt and sugars Follow up in 4 weeks

## 2016-07-12 NOTE — Patient Instructions (Signed)
Take norvasc with other BP med Follow up in 4 weeks  Watch sugar and salts

## 2016-07-14 ENCOUNTER — Telehealth: Payer: Self-pay | Admitting: Adult Health

## 2016-07-14 NOTE — Telephone Encounter (Signed)
Pt thought Anderson Malta was increasing her dose of Maxzide as well as adding another medication but when she went to the pharmacy they only had the new med ready for her.  Informed pt that according to Jennifer's notes she did not change dose, just added the other medication to be taken as well as the Maxzide.  Pt verbalized understanding.

## 2016-08-09 ENCOUNTER — Ambulatory Visit: Payer: PRIVATE HEALTH INSURANCE | Admitting: Adult Health

## 2016-08-21 ENCOUNTER — Encounter: Payer: Self-pay | Admitting: Adult Health

## 2016-08-21 ENCOUNTER — Ambulatory Visit (INDEPENDENT_AMBULATORY_CARE_PROVIDER_SITE_OTHER): Payer: PRIVATE HEALTH INSURANCE | Admitting: Adult Health

## 2016-08-21 ENCOUNTER — Encounter (INDEPENDENT_AMBULATORY_CARE_PROVIDER_SITE_OTHER): Payer: Self-pay

## 2016-08-21 VITALS — BP 140/90 | HR 78 | Ht 65.0 in | Wt 339.0 lb

## 2016-08-21 DIAGNOSIS — I10 Essential (primary) hypertension: Secondary | ICD-10-CM

## 2016-08-21 MED ORDER — AMLODIPINE BESYLATE 10 MG PO TABS: 10.0000 mg | ORAL_TABLET | Freq: Every day | ORAL | 3 refills | Status: DC

## 2016-08-21 NOTE — Progress Notes (Signed)
Subjective:     Patient ID: [name], female   DOB: 1980-11-10, 35 y.o.   MRN: [id number]  HPI [name] is a 35 year old black female in for BP check, no complaints.  Review of Systems Patient denies any headaches, hearing loss, fatigue, blurred vision, shortness of breath, chest pain, abdominal pain, problems with bowel movements, urination, or intercourse. No joint pain or mood swings. Reviewed past medical,surgical, social and family history. Reviewed medications and allergies.     Objective:   Physical Exam BP 140/90 (BP Location: Right Arm, Patient Position: Sitting, Cuff Size: Large)   Pulse 78   Ht 5\' 5"  (1.651 m)   Wt (!) 339 lb (153.8 kg)   LMP 07/31/2016 (Exact Date)   Breastfeeding? No   BMI 56.41 kg/m  Skin warm and dry.  Lungs: clear to ausculation bilaterally. Cardiovascular: regular rate and rhythm.No swelling in ankles.PHQ 2 score 0.   She did gain 10 lbs, try to watch carbs.  Assessment:     1. Essential hypertension       Plan:    Decrease salt and sugar Increase norvasc to 10 mg, can take 2, 5 mg tabs then  Rx norvasc 10 mg #30 take 1 daily with 3 refills Follow up in 4 weeks

## 2016-08-21 NOTE — Patient Instructions (Signed)
Increase norvasc to 10 mg and continue losartan Follow up in 4 weeks

## 2016-09-18 ENCOUNTER — Encounter: Payer: Self-pay | Admitting: *Deleted

## 2016-09-18 ENCOUNTER — Ambulatory Visit: Payer: PRIVATE HEALTH INSURANCE | Admitting: Adult Health

## 2017-03-26 ENCOUNTER — Other Ambulatory Visit: Payer: Self-pay | Admitting: Adult Health

## 2017-05-09 ENCOUNTER — Encounter (INDEPENDENT_AMBULATORY_CARE_PROVIDER_SITE_OTHER): Payer: Self-pay

## 2017-05-09 ENCOUNTER — Encounter: Payer: Self-pay | Admitting: Adult Health

## 2017-05-09 ENCOUNTER — Ambulatory Visit (INDEPENDENT_AMBULATORY_CARE_PROVIDER_SITE_OTHER): Payer: PRIVATE HEALTH INSURANCE | Admitting: Adult Health

## 2017-05-09 VITALS — BP 140/108 | HR 78 | Ht 65.0 in | Wt 346.0 lb

## 2017-05-09 DIAGNOSIS — Z975 Presence of (intrauterine) contraceptive device: Secondary | ICD-10-CM | POA: Insufficient documentation

## 2017-05-09 DIAGNOSIS — T50905A Adverse effect of unspecified drugs, medicaments and biological substances, initial encounter: Secondary | ICD-10-CM | POA: Insufficient documentation

## 2017-05-09 DIAGNOSIS — R635 Abnormal weight gain: Secondary | ICD-10-CM

## 2017-05-09 DIAGNOSIS — I10 Essential (primary) hypertension: Secondary | ICD-10-CM | POA: Diagnosis not present

## 2017-05-09 DIAGNOSIS — Z6841 Body Mass Index (BMI) 40.0 and over, adult: Secondary | ICD-10-CM

## 2017-05-09 DIAGNOSIS — N923 Ovulation bleeding: Secondary | ICD-10-CM | POA: Diagnosis not present

## 2017-05-09 DIAGNOSIS — Z3009 Encounter for other general counseling and advice on contraception: Secondary | ICD-10-CM | POA: Diagnosis not present

## 2017-05-09 DIAGNOSIS — F419 Anxiety disorder, unspecified: Secondary | ICD-10-CM | POA: Insufficient documentation

## 2017-05-09 DIAGNOSIS — R6882 Decreased libido: Secondary | ICD-10-CM | POA: Insufficient documentation

## 2017-05-09 MED ORDER — LOSARTAN POTASSIUM 50 MG PO TABS: 50.0000 mg | ORAL_TABLET | Freq: Every day | ORAL | 6 refills | Status: DC

## 2017-05-09 MED ORDER — BUSPIRONE HCL 5 MG PO TABS: 5.0000 mg | ORAL_TABLET | Freq: Three times a day (TID) | ORAL | 1 refills | Status: DC

## 2017-05-09 NOTE — Progress Notes (Signed)
Subjective:     Patient ID: [name], female   DOB: 03-Apr-1981, 36 y.o.   MRN: [id number]  HPI [name] is a 36 year old black female, married in complaining of spotting on nexplanon and cramps, decreased libido,moody, anxiety, weight gain since getting nexplanon and can't lose weight, and just not happy with nexplanon, wants tubal.  Review of Systems Spotting on nexplanon/cramps Decreased libido Moody/anxiety Weight gain/can't lose weight Not happy with nexplanon  Reviewed past medical,surgical, social and family history. Reviewed medications and allergies.     Objective:   Physical Exam BP (!) 140/108 (BP Location: Right Arm, Cuff Size: Normal)   Pulse 78   Ht 5\' 5"  (1.651 m)   Wt (!) 346 lb (156.9 kg)   LMP 04/30/2017   BMI 57.58 kg/m  Skin warm and dry. Lungs: clear to ausculation bilaterally. Cardiovascular: regular rate and rhythm.No swelling in ankles.Wants tubal.  Tubal papers signed. Discussed adding another BP med and DASH diet.Will add buspar.    Assessment:     1. Essential hypertension   2. Family planning   3. Nexplanon in place   4. Anxiety   5. Weight gain due to medication   6. Body mass index 50.0-59.9, adult (HCC)   7. Spotting between menses   8. Decreased libido       Plan:     Meds ordered this encounter  Medications  . losartan (COZAAR) 50 MG tablet    Sig: Take 1 tablet (50 mg total) by mouth daily.    Dispense:  30 tablet    Refill:  6    Order Specific Question:   Supervising Provider    Answer:   Elonda Husky, LUTHER H [[id number]]  . busPIRone (BUSPAR) 5 MG tablet    Sig: Take 1 tablet (5 mg total) by mouth 3 (three) times daily.    Dispense:  90 tablet    Refill:  1    Order Specific Question:   Supervising Provider    Answer:   Elonda Husky, LUTHER H [[id number]]  Continue norvasc and maxzide Follow up in 2 weeks for BP and weight check Review DASH diet

## 2017-05-09 NOTE — Patient Instructions (Signed)
DASH Eating Plan DASH stands for "Dietary Approaches to Stop Hypertension." The DASH eating plan is a healthy eating plan that has been shown to reduce high blood pressure (hypertension). It may also reduce your risk for type 2 diabetes, heart disease, and stroke. The DASH eating plan may also help with weight loss. What are tips for following this plan? General guidelines  Avoid eating more than 2,300 mg (milligrams) of salt (sodium) a day. If you have hypertension, you may need to reduce your sodium intake to 1,500 mg a day.  Limit alcohol intake to no more than 1 drink a day for nonpregnant women and 2 drinks a day for men. One drink equals 12 oz of beer, 5 oz of wine, or 1 oz of hard liquor.  Work with your health care provider to maintain a healthy body weight or to lose weight. Ask what an ideal weight is for you.  Get at least 30 minutes of exercise that causes your heart to beat faster (aerobic exercise) most days of the week. Activities may include walking, swimming, or biking.  Work with your health care provider or diet and nutrition specialist (dietitian) to adjust your eating plan to your individual calorie needs. Reading food labels  Check food labels for the amount of sodium per serving. Choose foods with less than 5 percent of the Daily Value of sodium. Generally, foods with less than 300 mg of sodium per serving fit into this eating plan.  To find whole grains, look for the word "whole" as the first word in the ingredient list. Shopping  Buy products labeled as "low-sodium" or "no salt added."  Buy fresh foods. Avoid canned foods and premade or frozen meals. Cooking  Avoid adding salt when cooking. Use salt-free seasonings or herbs instead of table salt or sea salt. Check with your health care provider or pharmacist before using salt substitutes.  Do not fry foods. Cook foods using healthy methods such as baking, boiling, grilling, and broiling instead.  Cook with  heart-healthy oils, such as olive, canola, soybean, or sunflower oil. Meal planning   Eat a balanced diet that includes: ? 5 or more servings of fruits and vegetables each day. At each meal, try to fill half of your plate with fruits and vegetables. ? Up to 6-8 servings of whole grains each day. ? Less than 6 oz of lean meat, poultry, or fish each day. A 3-oz serving of meat is about the same size as a deck of cards. One egg equals 1 oz. ? 2 servings of low-fat dairy each day. ? A serving of nuts, seeds, or beans 5 times each week. ? Heart-healthy fats. Healthy fats called Omega-3 fatty acids are found in foods such as flaxseeds and coldwater fish, like sardines, salmon, and mackerel.  Limit how much you eat of the following: ? Canned or prepackaged foods. ? Food that is high in trans fat, such as fried foods. ? Food that is high in saturated fat, such as fatty meat. ? Sweets, desserts, sugary drinks, and other foods with added sugar. ? Full-fat dairy products.  Do not salt foods before eating.  Try to eat at least 2 vegetarian meals each week.  Eat more home-cooked food and less restaurant, buffet, and fast food.  When eating at a restaurant, ask that your food be prepared with less salt or no salt, if possible. What foods are recommended? The items listed may not be a complete list. Talk with your dietitian about what   dietary choices are best for you. Grains Whole-grain or whole-wheat bread. Whole-grain or whole-wheat pasta. Brown rice. Oatmeal. Quinoa. Bulgur. Whole-grain and low-sodium cereals. Pita bread. Low-fat, low-sodium crackers. Whole-wheat flour tortillas. Vegetables Fresh or frozen vegetables (raw, steamed, roasted, or grilled). Low-sodium or reduced-sodium tomato and vegetable juice. Low-sodium or reduced-sodium tomato sauce and tomato paste. Low-sodium or reduced-sodium canned vegetables. Fruits All fresh, dried, or frozen fruit. Canned fruit in natural juice (without  added sugar). Meat and other protein foods Skinless chicken or turkey. Ground chicken or turkey. Pork with fat trimmed off. Fish and seafood. Egg whites. Dried beans, peas, or lentils. Unsalted nuts, nut butters, and seeds. Unsalted canned beans. Lean cuts of beef with fat trimmed off. Low-sodium, lean deli meat. Dairy Low-fat (1%) or fat-free (skim) milk. Fat-free, low-fat, or reduced-fat cheeses. Nonfat, low-sodium ricotta or cottage cheese. Low-fat or nonfat yogurt. Low-fat, low-sodium cheese. Fats and oils Soft margarine without trans fats. Vegetable oil. Low-fat, reduced-fat, or light mayonnaise and salad dressings (reduced-sodium). Canola, safflower, olive, soybean, and sunflower oils. Avocado. Seasoning and other foods Herbs. Spices. Seasoning mixes without salt. Unsalted popcorn and pretzels. Fat-free sweets. What foods are not recommended? The items listed may not be a complete list. Talk with your dietitian about what dietary choices are best for you. Grains Baked goods made with fat, such as croissants, muffins, or some breads. Dry pasta or rice meal packs. Vegetables Creamed or fried vegetables. Vegetables in a cheese sauce. Regular canned vegetables (not low-sodium or reduced-sodium). Regular canned tomato sauce and paste (not low-sodium or reduced-sodium). Regular tomato and vegetable juice (not low-sodium or reduced-sodium). Pickles. Olives. Fruits Canned fruit in a light or heavy syrup. Fried fruit. Fruit in cream or butter sauce. Meat and other protein foods Fatty cuts of meat. Ribs. Fried meat. Bacon. Sausage. Bologna and other processed lunch meats. Salami. Fatback. Hotdogs. Bratwurst. Salted nuts and seeds. Canned beans with added salt. Canned or smoked fish. Whole eggs or egg yolks. Chicken or turkey with skin. Dairy Whole or 2% milk, cream, and half-and-half. Whole or full-fat cream cheese. Whole-fat or sweetened yogurt. Full-fat cheese. Nondairy creamers. Whipped toppings.  Processed cheese and cheese spreads. Fats and oils Butter. Stick margarine. Lard. Shortening. Ghee. Bacon fat. Tropical oils, such as coconut, palm kernel, or palm oil. Seasoning and other foods Salted popcorn and pretzels. Onion salt, garlic salt, seasoned salt, table salt, and sea salt. Worcestershire sauce. Tartar sauce. Barbecue sauce. Teriyaki sauce. Soy sauce, including reduced-sodium. Steak sauce. Canned and packaged gravies. Fish sauce. Oyster sauce. Cocktail sauce. Horseradish that you find on the shelf. Ketchup. Mustard. Meat flavorings and tenderizers. Bouillon cubes. Hot sauce and Tabasco sauce. Premade or packaged marinades. Premade or packaged taco seasonings. Relishes. Regular salad dressings. Where to find more information:  National Heart, Lung, and Blood Institute: www.nhlbi.nih.gov  American Heart Association: www.heart.org Summary  The DASH eating plan is a healthy eating plan that has been shown to reduce high blood pressure (hypertension). It may also reduce your risk for type 2 diabetes, heart disease, and stroke.  With the DASH eating plan, you should limit salt (sodium) intake to 2,300 mg a day. If you have hypertension, you may need to reduce your sodium intake to 1,500 mg a day.  When on the DASH eating plan, aim to eat more fresh fruits and vegetables, whole grains, lean proteins, low-fat dairy, and heart-healthy fats.  Work with your health care provider or diet and nutrition specialist (dietitian) to adjust your eating plan to your individual   calorie needs. This information is not intended to replace advice given to you by your health care provider. Make sure you discuss any questions you have with your health care provider. Document Released: 08/17/2011 Document Revised: 08/21/2016 Document Reviewed: 08/21/2016 Elsevier Interactive Patient Education  2017 [employer] Inc.  

## 2017-05-11 ENCOUNTER — Other Ambulatory Visit: Payer: Self-pay | Admitting: Adult Health

## 2017-05-23 ENCOUNTER — Ambulatory Visit: Payer: PRIVATE HEALTH INSURANCE | Admitting: Adult Health

## 2017-05-30 ENCOUNTER — Encounter: Payer: Self-pay | Admitting: Adult Health

## 2017-05-30 ENCOUNTER — Ambulatory Visit (INDEPENDENT_AMBULATORY_CARE_PROVIDER_SITE_OTHER): Payer: PRIVATE HEALTH INSURANCE | Admitting: Adult Health

## 2017-05-30 VITALS — BP 140/80 | HR 72 | Ht 65.0 in | Wt 344.8 lb

## 2017-05-30 DIAGNOSIS — F419 Anxiety disorder, unspecified: Secondary | ICD-10-CM

## 2017-05-30 DIAGNOSIS — I10 Essential (primary) hypertension: Secondary | ICD-10-CM

## 2017-05-30 NOTE — Patient Instructions (Signed)
Continue meds, can reduce buspar to bid if desired Follow up in 2 weeks for pre op for tubal

## 2017-05-30 NOTE — Progress Notes (Signed)
Subjective:     Patient ID: [name], female   DOB: 29-Jan-1981, 36 y.o.   MRN: [id number]  HPI [name] is a 36 year old black female in for BP and weight check.She is less anxious and has no headaches but feels tired.   Review of Systems +tired Denies headaches Not as anxious Reviewed past medical,surgical, social and family history. Reviewed medications and allergies.     Objective:   Physical Exam BP 140/80 (BP Location: Left Arm, Patient Position: Sitting, Cuff Size: Large)   Pulse 72   Ht 5\' 5"  (1.651 m)   Wt (!) 344 lb 12.8 oz (156.4 kg)   LMP 04/30/2017   BMI 57.38 kg/m  Skin warm and dry. Lungs: clear to ausculation bilaterally. Cardiovascular: regular rate and rhythm.   Did lose about 1.5 lbs.Praised over efforts and BP better than it was 2 weeks ago.  Will bring in 2 weeks for pre op for tubal and nexplanon removal.   Assessment:     1. Essential hypertension   2. Anxiety       Plan:     Continue meds, can reduce buspar to bid if desired Follow up in 2 weeks for pre op for tubal

## 2017-06-14 ENCOUNTER — Ambulatory Visit (INDEPENDENT_AMBULATORY_CARE_PROVIDER_SITE_OTHER): Payer: PRIVATE HEALTH INSURANCE | Admitting: Obstetrics & Gynecology

## 2017-06-14 ENCOUNTER — Encounter: Payer: Self-pay | Admitting: Obstetrics & Gynecology

## 2017-06-14 VITALS — BP 138/80 | Ht 65.0 in | Wt 343.0 lb

## 2017-06-14 DIAGNOSIS — Z3009 Encounter for other general counseling and advice on contraception: Secondary | ICD-10-CM

## 2017-06-14 DIAGNOSIS — Z01818 Encounter for other preprocedural examination: Secondary | ICD-10-CM | POA: Diagnosis not present

## 2017-06-14 DIAGNOSIS — Z302 Encounter for sterilization: Secondary | ICD-10-CM | POA: Diagnosis not present

## 2017-06-14 NOTE — Progress Notes (Signed)
Patient ID: [name], female   DOB: 10-10-1980, 36 y.o.   MRN: [id number]  Patient has decided to have the Bilateral Tubal Ligation and will schedule for October 17th 2018.

## 2017-06-14 NOTE — Progress Notes (Signed)
Preoperative History and Physical  [name] is a 36 y.o. [id number] with Patient's last menstrual period was 04/30/2017. admitted for a laparoscopic tubal ligation using electrocautery for the purpose of permanent sterilization.  Patient is seen because she wants to have no more children, she of course is multiparous with 4 kids and desires a permanent sterilization procedure We discussed the option of salpingectomy versus electrocautery and she opts for a tubal ligation using electrocautery  PMH:    Past Medical History:  Diagnosis Date  . Contraceptive education 01/10/2016  . HSV infection   . Hypertension    chtn  . Mastitis 11/11/2015  . Nausea and vomiting 11/11/2015  . Nexplanon insertion 01/20/2016   Inserted left arm 01/20/16  . PIH (pregnancy induced hypertension)   . 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 Pregnancy induced hypertension    history  . Pregnant 03/22/2015  . Smoker   . Swollen lymph nodes 01/20/2016  . Thyroid disease   . Vitamin D deficiency 06/13/2016    PSH:     Past Surgical History:  Procedure Laterality Date  . CHOLECYSTECTOMY  2013  . DILATION AND CURETTAGE OF UTERUS    . EXCISION OF SKIN TAG N/A 12/23/2015   Procedure: REMOVAL OF GLUTEAL CREASE SKIN TAG;  Surgeon: Jonnie Kind, MD;  Location: [facility];  Service: Gynecology;  Laterality: N/A;  . HYSTEROSCOPY W/D&C N/A 12/23/2015   Procedure: DILATATION AND CURETTAGE /HYSTEROSCOPY;  Surgeon: Jonnie Kind, MD;  Location: [facility];  Service: Gynecology;  Laterality: N/A;    POb/GynH:      OB History    Gravida Para Term Preterm AB Living   6 4 4  0 2 4   SAB TAB Ectopic Multiple Live Births     1 0 0 4      SH:   Social History  Substance Use Topics  . Smoking status: Current Some Day Smoker    Packs/day: 0.00    Years: 8.00    Types: Cigarettes    Start date: 06/15/2012  . Smokeless tobacco: Never Used  . Alcohol use 0.0 oz/week     Comment: occ    FH:    Family History  Problem  Relation Age of Onset  . Cancer Maternal Grandmother   . Diabetes Maternal Grandmother   . Heart disease Paternal Grandmother   . Heart disease Paternal Grandfather      Allergies:  Allergies  Allergen Reactions  . Morphine And Related Itching    Medications:       Current Outpatient Prescriptions:  .  amLODipine (NORVASC) 10 MG tablet, take 1 tablet by mouth once daily, Disp: 30 tablet, Rfl: 6 .  busPIRone (BUSPAR) 5 MG tablet, Take 1 tablet (5 mg total) by mouth 3 (three) times daily., Disp: 90 tablet, Rfl: 1 .  etonogestrel (NEXPLANON) 68 MG IMPL implant, 1 each by Subdermal route once., Disp: , Rfl:  .  losartan (COZAAR) 50 MG tablet, Take 1 tablet (50 mg total) by mouth daily., Disp: 30 tablet, Rfl: 6 .  triamterene-hydrochlorothiazide (MAXZIDE-25) 37.5-25 MG tablet, take 1 tablet by mouth once daily, Disp: 30 tablet, Rfl: 6  Review of Systems:   Review of Systems  Constitutional: Negative for fever, chills, weight loss, malaise/fatigue and diaphoresis.  HENT: Negative for hearing loss, ear pain, nosebleeds, congestion, sore throat, neck pain, tinnitus and ear discharge.   Eyes: Negative for blurred vision, double vision, photophobia, pain, discharge and redness.  Respiratory: Negative for cough, hemoptysis, sputum  production, shortness of breath, wheezing and stridor.   Cardiovascular: Negative for chest pain, palpitations, orthopnea, claudication, leg swelling and PND.  Gastrointestinal: Positive for abdominal pain. Negative for heartburn, nausea, vomiting, diarrhea, constipation, blood in stool and melena.  Genitourinary: Negative for dysuria, urgency, frequency, hematuria and flank pain.  Musculoskeletal: Negative for myalgias, back pain, joint pain and falls.  Skin: Negative for itching and rash.  Neurological: Negative for dizziness, tingling, tremors, sensory change, speech change, focal weakness, seizures, loss of consciousness, weakness and headaches.   Endo/Heme/Allergies: Negative for environmental allergies and polydipsia. Does not bruise/bleed easily.  Psychiatric/Behavioral: Negative for depression, suicidal ideas, hallucinations, memory loss and substance abuse. The patient is not nervous/anxious and does not have insomnia.      PHYSICAL EXAM:  Blood pressure 138/80, height 5\' 5"  (1.651 m), weight (!) 343 lb (155.6 kg), last menstrual period 04/30/2017, not currently breastfeeding.    Vitals reviewed. Constitutional: She is oriented to person, place, and time. She appears well-developed and well-nourished.  HENT:  Head: Normocephalic and atraumatic.  Right Ear: External ear normal.  Left Ear: External ear normal.  Nose: Nose normal.  Mouth/Throat: Oropharynx is clear and moist.  Eyes: Conjunctivae and EOM are normal. Pupils are equal, round, and reactive to light. Right eye exhibits no discharge. Left eye exhibits no discharge. No scleral icterus.  Neck: Normal range of motion. Neck supple. No tracheal deviation present. No thyromegaly present.  Cardiovascular: Normal rate, regular rhythm, normal heart sounds and intact distal pulses.  Exam reveals no gallop and no friction rub.   No murmur heard. Respiratory: Effort normal and breath sounds normal. No respiratory distress. She has no wheezes. She has no rales. She exhibits no tenderness.  GI: Soft. Bowel sounds are normal. She exhibits no distension and no mass. There is tenderness. There is no rebound and no guarding.  Genitourinary:       Vulva is normal without lesions Vagina is pink moist without discharge Cervix normal in appearance and pap is normal Uterus is normal size, contour, position, consistency, mobility, non-tender Adnexa is negative with normal sized ovaries by sonogram  Musculoskeletal: Normal range of motion. She exhibits no edema and no tenderness.  Neurological: She is alert and oriented to person, place, and time. She has normal reflexes. She displays  normal reflexes. No cranial nerve deficit. She exhibits normal muscle tone. Coordination normal.  Skin: Skin is warm and dry. No rash noted. No erythema. No pallor.  Psychiatric: She has a normal mood and affect. Her behavior is normal. Judgment and thought content normal.    Labs: No results found for this or any previous visit (from the past 336 hour(s)).  EKG: Orders placed or performed during the hospital encounter of 12/22/15  . EKG 12-Lead  . EKG 12-Lead  . EKG 12-Lead  . EKG 12-Lead    Imaging Studies: No results found.    Assessment: Patient Active Problem List   Diagnosis Date Noted  . Decreased libido 05/09/2017  . Spotting between menses 05/09/2017  . Body mass index 50.0-59.9, adult ([place]) 05/09/2017  . Weight gain due to medication 05/09/2017  . Anxiety 05/09/2017  . Nexplanon in place 05/09/2017  . Vitamin D deficiency 06/13/2016  . Family planning 01/20/2016  . Swollen lymph nodes 01/20/2016  . Contraceptive education 01/10/2016  . Hypothyroidism 12/20/2015  . Postpartum anemia 12/20/2015  . Mastitis 11/11/2015  . 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 Hx of preeclampsia, prior pregnancy, currently pregnant 04/21/2015  .  Morbid obesity ([place]) 11/11/2014  . Essential hypertension 06/19/2014    Plan: Laparoscopic bilateral tubal ligation using electrocautery for the purpose of permanent sterilization patient declines the opportunity to have bilateral salpingectomy, instead opts for electrocautery tubal ligation She understands the permanent nature of the procedure as well as the failure rate of approximately 1 in 300  Pt understands the risks of surgery including but not limited t  excessive bleeding requiring transfusion or reoperation, post-operative infection requiring prolonged hospitalization or re-hospitalization and antibiotic therapy, and damage to other organs including bladder, bowel, ureters and major vessels.  The  patient also understands the alternative treatment options which were discussed in full.  All questions were answered.    Izel Eisenhardt H 06/14/2017 12:20 PM      Face to face time:  15 minutes  Greater than 50% of the visit time was spent in counseling and coordination of care with the patient.  The summary and outline of the counseling and care coordination is summarized in the note above.   All questions were answered.

## 2017-06-20 NOTE — Patient Instructions (Signed)
[name]  06/20/2017     @[email or web address] @   Your procedure is scheduled on  06/27/2017   Report to [facility] at  1015  A.M.  Call this number if you have problems the morning of surgery:  [phone]   Remember:  Do not eat food or drink liquids after midnight.  Take these medicines the morning of surgery with A SIP OF WATER  Amlodipine, buspar, losartan.   Do not wear jewelry, make-up or nail polish.  Do not wear lotions, powders, or perfumes, or deoderant.  Do not shave 48 hours prior to surgery.  Men may shave face and neck.  Do not bring valuables to the hospital.  [facility] is not responsible for any belongings or valuables.  Contacts, dentures or bridgework may not be worn into surgery.  Leave your suitcase in the car.  After surgery it may be brought to your room.  For patients admitted to the hospital, discharge time will be determined by your treatment team.  Patients discharged the day of surgery will not be allowed to drive home.   Name and phone number of your driver:   family Special instructions:  None  Please read over the following fact sheets that you were given. Anesthesia Post-op Instructions and Care and Recovery After Surgery       Laparoscopic Tubal Ligation Laparoscopic tubal ligation is a procedure to close the fallopian tubes. This is done so that you cannot get pregnant. When the fallopian tubes are closed, the eggs that your ovaries release cannot enter the uterus, and sperm cannot reach the released eggs. A laparoscopic tubal ligation is sometimes called "getting your tubes tied." You should not have this procedure if you want to get pregnant someday or if you are unsure about having more children. Tell a health care provider about:  Any allergies you have.  All medicines you are taking, including vitamins, herbs, eye drops, creams, and over-the-counter medicines.  Any problems you or family members have had  with anesthetic medicines.  Any blood disorders you have.  Any surgeries you have had.  Any medical conditions you have.  Whether you are pregnant or may be pregnant.  Any past pregnancies. What are the risks? Generally, this is a safe procedure. However, problems may occur, including:  Infection.  Bleeding.  Injury to surrounding organs.  Side effects from anesthetics.  Failure of the procedure.  This procedure can increase your risk of a kind of pregnancy in which a fertilized egg attaches to the outside of the uterus (ectopic pregnancy). What happens before the procedure?  Ask your health care provider about: ? Changing or stopping your regular medicines. This is especially important if you are taking diabetes medicines or blood thinners. ? Taking medicines such as aspirin and ibuprofen. These medicines can thin your blood. Do not take these medicines before your procedure if your health care provider instructs you not to.  Follow instructions from your health care provider about eating and drinking restrictions.  Plan to have someone take you home after the procedure.  If you go home right after the procedure, plan to have someone with you for 24 hours. What happens during the procedure?  You will be given one or more of the following: ? A medicine to help you relax (sedative). ? A medicine to numb the area (local anesthetic). ? A medicine to make you fall asleep (general anesthetic). ? A medicine  that is injected into an area of your body to numb everything below the injection site (regional anesthetic).  An IV tube will be inserted into one of your veins. It will be used to give you medicines and fluids during the procedure.  Your bladder may be emptied with a small tube (catheter).  If you have been given a general anesthetic, a tube will be put down your throat to help you breathe.  Two small cuts (incisions) will be made in your lower abdomen and near your  belly button.  Your abdomen will be inflated with a gas. This will let the surgeon see better and will give the surgeon room to work.  A thin, lighted tube (laparoscope) with a camera attached will be inserted into your abdomen through one of the incisions. Small instruments will be inserted through the other incision.  The fallopian tubes will be tied off, burned (cauterized), or blocked with a clip, ring, or clamp. A small portion in the center of each fallopian tube may be removed.  The gas will be released from the abdomen.  The incisions will be closed with stitches (sutures).  A bandage (dressing) will be placed over the incisions. The procedure may vary among health care providers and hospitals. What happens after the procedure?  Your blood pressure, heart rate, breathing rate, and blood oxygen level will be monitored often until the medicines you were given have worn off.  You will be given medicine to help with pain, nausea, and vomiting as needed. This information is not intended to replace advice given to you by your health care provider. Make sure you discuss any questions you have with your health care provider. Document Released: 12/04/2000 Document Revised: 02/03/2016 Document Reviewed: 08/08/2015 Elsevier Interactive Patient Education  2018 [employer].  Laparoscopic Tubal Ligation, Care After Refer to this sheet in the next few weeks. These instructions provide you with information about caring for yourself after your procedure. Your health care provider may also give you more specific instructions. Your treatment has been planned according to current medical practices, but problems sometimes occur. Call your health care provider if you have any problems or questions after your procedure. What can I expect after the procedure? After the procedure, it is common to have:  A sore throat.  Discomfort in your shoulder.  Mild discomfort or cramping in your abdomen.  Gas  pains.  Pain or soreness in the area where the surgical cut (incision) was made.  A bloated feeling.  Tiredness.  Nausea.  Vomiting.  Follow these instructions at home: Medicines  Take over-the-counter and prescription medicines only as told by your health care provider.  Do not take aspirin because it can cause bleeding.  Do not drive or operate heavy machinery while taking prescription pain medicine. Activity  Rest for the rest of the day.  Return to your normal activities as told by your health care provider. Ask your health care provider what activities are safe for you. Incision care   Follow instructions from your health care provider about how to take care of your incision. Make sure you: ? Wash your hands with soap and water before you change your bandage (dressing). If soap and water are not available, use hand sanitizer. ? Change your dressing as told by your health care provider. ? Leave stitches (sutures) in place. They may need to stay in place for 2 weeks or longer.  Check your incision area every day for signs of infection. Check for: ?  More redness, swelling, or pain. ? More fluid or blood. ? Warmth. ? Pus or a bad smell. Other Instructions  Do not take baths, swim, or use a hot tub until your health care provider approves. You may take showers.  Keep all follow-up visits as told by your health care provider. This is important.  Have someone help you with your daily household tasks for the first few days. Contact a health care provider if:  You have more redness, swelling, or pain around your incision.  Your incision feels warm to the touch.  You have pus or a bad smell coming from your incision.  The edges of your incision break open after the sutures have been removed.  Your pain does not improve after 2-3 days.  You have a rash.  You repeatedly become dizzy or light-headed.  Your pain medicine is not helping.  You are constipated. Get  help right away if:  You have a fever.  You faint.  You have increasing pain in your abdomen.  You have severe pain in one or both of your shoulders.  You have fluid or blood coming from your sutures or from your vagina.  You have shortness of breath or difficulty breathing.  You have chest pain or leg pain.  You have ongoing nausea, vomiting, or diarrhea. This information is not intended to replace advice given to you by your health care provider. Make sure you discuss any questions you have with your health care provider. Document Released: 03/17/2005 Document Revised: 01/31/2016 Document Reviewed: 08/08/2015 Elsevier Interactive Patient Education  2018 [employer] Anesthesia, Adult General anesthesia is the use of medicines to make a person "go to sleep" (be unconscious) for a medical procedure. General anesthesia is often recommended when a procedure:  Is long.  Requires you to be still or in an unusual position.  Is major and can cause you to lose blood.  Is impossible to do without general anesthesia.  The medicines used for general anesthesia are called general anesthetics. In addition to making you sleep, the medicines:  Prevent pain.  Control your blood pressure.  Relax your muscles.  Tell a health care provider about:  Any allergies you have.  All medicines you are taking, including vitamins, herbs, eye drops, creams, and over-the-counter medicines.  Any problems you or family members have had with anesthetic medicines.  Types of anesthetics you have had in the past.  Any bleeding disorders you have.  Any surgeries you have had.  Any medical conditions you have.  Any history of heart or lung conditions, such as heart failure, sleep apnea, or chronic obstructive pulmonary disease (COPD).  Whether you are pregnant or may be pregnant.  Whether you use tobacco, alcohol, marijuana, or street drugs.  Any history of Armed forces logistics/support/administrative officer.  Any  history of depression or anxiety. What are the risks? Generally, this is a safe procedure. However, problems may occur, including:  Allergic reaction to anesthetics.  Lung and heart problems.  Inhaling food or liquids from your stomach into your lungs (aspiration).  Injury to nerves.  Waking up during your procedure and being unable to move (rare).  Extreme agitation or a state of mental confusion (delirium) when you wake up from the anesthetic.  Air in the bloodstream, which can lead to stroke.  These problems are more likely to develop if you are having a major surgery or if you have an advanced medical condition. You can prevent some of these complications by answering  all of your health care provider's questions thoroughly and by following all pre-procedure instructions. General anesthesia can cause side effects, including:  Nausea or vomiting  A sore throat from the breathing tube.  Feeling cold or shivery.  Feeling tired, washed out, or achy.  Sleepiness or drowsiness.  Confusion or agitation.  What happens before the procedure? Staying hydrated Follow instructions from your health care provider about hydration, which may include:  Up to 2 hours before the procedure - you may continue to drink clear liquids, such as water, clear fruit juice, black coffee, and plain tea.  Eating and drinking restrictions Follow instructions from your health care provider about eating and drinking, which may include:  8 hours before the procedure - stop eating heavy meals or foods such as meat, fried foods, or fatty foods.  6 hours before the procedure - stop eating light meals or foods, such as toast or cereal.  6 hours before the procedure - stop drinking milk or drinks that contain milk.  2 hours before the procedure - stop drinking clear liquids.  Medicines  Ask your health care provider about: ? Changing or stopping your regular medicines. This is especially important if  you are taking diabetes medicines or blood thinners. ? Taking medicines such as aspirin and ibuprofen. These medicines can thin your blood. Do not take these medicines before your procedure if your health care provider instructs you not to. ? Taking new dietary supplements or medicines. Do not take these during the week before your procedure unless your health care provider approves them.  If you are told to take a medicine or to continue taking a medicine on the day of the procedure, take the medicine with sips of water. General instructions   Ask if you will be going home the same day, the following day, or after a longer hospital stay. ? Plan to have someone take you home. ? Plan to have someone stay with you for the first 24 hours after you leave the hospital or clinic.  For 3-6 weeks before the procedure, try not to use any tobacco products, such as cigarettes, chewing tobacco, and e-cigarettes.  You may brush your teeth on the morning of the procedure, but make sure to spit out the toothpaste. What happens during the procedure?  You will be given anesthetics through a mask and through an IV tube in one of your veins.  You may receive medicine to help you relax (sedative).  As soon as you are asleep, a breathing tube may be used to help you breathe.  An anesthesia specialist will stay with you throughout the procedure. He or she will help keep you comfortable and safe by continuing to give you medicines and adjusting the amount of medicine that you get. He or she will also watch your blood pressure, pulse, and oxygen levels to make sure that the anesthetics do not cause any problems.  If a breathing tube was used to help you breathe, it will be removed before you wake up. The procedure may vary among health care providers and hospitals. What happens after the procedure?  You will wake up, often slowly, after the procedure is complete, usually in a recovery area.  Your blood  pressure, heart rate, breathing rate, and blood oxygen level will be monitored until the medicines you were given have worn off.  You may be given medicine to help you calm down if you feel anxious or agitated.  If you will be going home  the same day, your health care provider may check to make sure you can stand, drink, and urinate.  Your health care providers will treat your pain and side effects before you go home.  Do not drive for 24 hours if you received a sedative.  You may: ? Feel nauseous and vomit. ? Have a sore throat. ? Have mental slowness. ? Feel cold or shivery. ? Feel sleepy. ? Feel tired. ? Feel sore or achy, even in parts of your body where you did not have surgery. This information is not intended to replace advice given to you by your health care provider. Make sure you discuss any questions you have with your health care provider. Document Released: 12/05/2007 Document Revised: 02/08/2016 Document Reviewed: 08/12/2015 Elsevier Interactive Patient Education  2018 [employer] Anesthesia, Adult, Care After These instructions provide you with information about caring for yourself after your procedure. Your health care provider may also give you more specific instructions. Your treatment has been planned according to current medical practices, but problems sometimes occur. Call your health care provider if you have any problems or questions after your procedure. What can I expect after the procedure? After the procedure, it is common to have:  Vomiting.  A sore throat.  Mental slowness.  It is common to feel:  Nauseous.  Cold or shivery.  Sleepy.  Tired.  Sore or achy, even in parts of your body where you did not have surgery.  Follow these instructions at home: For at least 24 hours after the procedure:  Do not: ? Participate in activities where you could fall or become injured. ? Drive. ? Use heavy machinery. ? Drink alcohol. ? Take  sleeping pills or medicines that cause drowsiness. ? Make important decisions or sign legal documents. ? Take care of children on your own.  Rest. Eating and drinking  If you vomit, drink water, juice, or soup when you can drink without vomiting.  Drink enough fluid to keep your urine clear or pale yellow.  Make sure you have little or no nausea before eating solid foods.  Follow the diet recommended by your health care provider. General instructions  Have a responsible adult stay with you until you are awake and alert.  Return to your normal activities as told by your health care provider. Ask your health care provider what activities are safe for you.  Take over-the-counter and prescription medicines only as told by your health care provider.  If you smoke, do not smoke without supervision.  Keep all follow-up visits as told by your health care provider. This is important. Contact a health care provider if:  You continue to have nausea or vomiting at home, and medicines are not helpful.  You cannot drink fluids or start eating again.  You cannot urinate after 8-12 hours.  You develop a skin rash.  You have fever.  You have increasing redness at the site of your procedure. Get help right away if:  You have difficulty breathing.  You have chest pain.  You have unexpected bleeding.  You feel that you are having a life-threatening or urgent problem. This information is not intended to replace advice given to you by your health care provider. Make sure you discuss any questions you have with your health care provider. Document Released: 12/04/2000 Document Revised: 01/31/2016 Document Reviewed: 08/12/2015 Elsevier Interactive Patient Education  [employer].

## 2017-06-25 ENCOUNTER — Encounter (HOSPITAL_COMMUNITY)
Admission: RE | Admit: 2017-06-25 | Discharge: 2017-06-25 | Disposition: A | Discharge status: Home / Self Care | Payer: PRIVATE HEALTH INSURANCE | Source: Ambulatory Visit | Attending: Obstetrics & Gynecology | Admitting: Obstetrics & Gynecology

## 2017-06-25 ENCOUNTER — Other Ambulatory Visit: Payer: Self-pay | Admitting: Obstetrics & Gynecology

## 2017-06-25 ENCOUNTER — Ambulatory Visit (INDEPENDENT_AMBULATORY_CARE_PROVIDER_SITE_OTHER): Payer: PRIVATE HEALTH INSURANCE | Admitting: Adult Health

## 2017-06-25 ENCOUNTER — Encounter: Payer: Self-pay | Admitting: Adult Health

## 2017-06-25 VITALS — BP 140/86 | HR 86 | Resp 22 | Ht 65.0 in | Wt 350.0 lb

## 2017-06-25 DIAGNOSIS — J40 Bronchitis, not specified as acute or chronic: Secondary | ICD-10-CM | POA: Diagnosis not present

## 2017-06-25 MED ORDER — AZITHROMYCIN 250 MG PO TABS: ORAL_TABLET | ORAL | 0 refills | Status: DC

## 2017-06-25 MED ORDER — BENZONATATE 100 MG PO CAPS: 100.0000 mg | ORAL_CAPSULE | Freq: Three times a day (TID) | ORAL | 0 refills | Status: DC | PRN

## 2017-06-25 NOTE — Progress Notes (Signed)
Subjective:     Patient ID: [name], female   DOB: 22-May-1981, 36 y.o.   MRN: [id number]  HPI Obdulia is a 36 year old black female, worked in for cough and congestion ,was to have pre op today but they canceled due to her cough and congestion.   Review of Systems +cough and congestion  Reviewed past medical,surgical, social and family history. Reviewed medications and allergies.     Objective:   Physical Exam BP 140/86 (BP Location: Left Arm, Patient Position: Sitting, Cuff Size: Large)   Pulse 86   Resp (!) 22   Ht 5\' 5"  (1.651 m)   Wt (!) 350 lb (158.8 kg)   SpO2 95%   BMI 58.24 kg/m    Skin warm and dry.Lungs: no wheezing noted. Cardiovascular: regular rate and rhythm.  Assessment:     1. Bronchitis       Plan:     Meds ordered this encounter  Medications  . azithromycin (ZITHROMAX) 250 MG tablet    Sig: Take 2 now and then 1 daily for next 4 days    Dispense:  6 tablet    Refill:  0    Order Specific Question:   Supervising Provider    Answer:   Elonda Husky, LUTHER H [[id number]]  . benzonatate (TESSALON) 100 MG capsule    Sig: Take 1 capsule (100 mg total) by mouth 3 (three) times daily as needed for cough.    Dispense:  30 capsule    Refill:  0    Order Specific Question:   Supervising Provider    Answer:   [name] [[id number]]  Will reschedule surgery(tubal) to 11/7 with Dr Elonda Husky

## 2017-06-27 ENCOUNTER — Ambulatory Visit (HOSPITAL_COMMUNITY)
Admission: RE | Admit: 2017-06-27 | Payer: PRIVATE HEALTH INSURANCE | Source: Ambulatory Visit | Admitting: Obstetrics & Gynecology

## 2017-06-27 ENCOUNTER — Encounter (HOSPITAL_COMMUNITY): Admission: RE | Payer: Self-pay | Source: Ambulatory Visit

## 2017-06-27 SURGERY — LIGATION, FALLOPIAN TUBE, BILATERAL
Anesthesia: General | Laterality: Bilateral

## 2017-06-29 ENCOUNTER — Other Ambulatory Visit (HOSPITAL_COMMUNITY): Payer: PRIVATE HEALTH INSURANCE

## 2017-07-04 ENCOUNTER — Inpatient Hospital Stay: Admit: 2017-07-04 | Payer: PRIVATE HEALTH INSURANCE | Admitting: Obstetrics & Gynecology

## 2017-07-04 SURGERY — HYSTERECTOMY, VAGINAL
Anesthesia: General

## 2017-07-05 ENCOUNTER — Encounter: Payer: PRIVATE HEALTH INSURANCE | Admitting: Obstetrics & Gynecology

## 2017-07-10 NOTE — Patient Instructions (Signed)
[name]  07/10/2017     @PREFPERIOPPHARMACY @   Your procedure is scheduled on 07/18/2017.  Report to [facility] at 8:30 A.M.  Call this number if you have problems the morning of surgery:  [phone]   Remember:  Do not eat food or drink liquids after midnight.  Take these medicines the morning of surgery with A SIP OF WATER Amlodipine, Buspar, Cozaar   Do not wear jewelry, make-up or nail polish.  Do not wear lotions, powders, or perfumes, or deoderant.  Do not shave 48 hours prior to surgery.  Men may shave face and neck.  Do not bring valuables to the hospital.  [facility] is not responsible for any belongings or valuables.  Contacts, dentures or bridgework may not be worn into surgery.  Leave your suitcase in the car.  After surgery it may be brought to your room.  For patients admitted to the hospital, discharge time will be determined by your treatment team.  Patients discharged the day of surgery will not be allowed to drive home.    Please read over the following fact sheets that you were given. Surgical Site Infection Prevention and Anesthesia Post-op Instructions     PATIENT INSTRUCTIONS POST-ANESTHESIA  IMMEDIATELY FOLLOWING SURGERY:  Do not drive or operate machinery for the first twenty four hours after surgery.  Do not make any important decisions for twenty four hours after surgery or while taking narcotic pain medications or sedatives.  If you develop intractable nausea and vomiting or a severe headache please notify your doctor immediately.  FOLLOW-UP:  Please make an appointment with your surgeon as instructed. You do not need to follow up with anesthesia unless specifically instructed to do so.  WOUND CARE INSTRUCTIONS (if applicable):  Keep a dry clean dressing on the anesthesia/puncture wound site if there is drainage.  Once the wound has quit draining you may leave it open to air.  Generally you should leave the bandage intact for twenty  four hours unless there is drainage.  If the epidural site drains for more than 36-48 hours please call the anesthesia department.  QUESTIONS?:  Please feel free to call your physician or the hospital operator if you have any questions, and they will be happy to assist you.      Laparoscopic Tubal Ligation Laparoscopic tubal ligation is a procedure to close the fallopian tubes. This is done so that you cannot get pregnant. When the fallopian tubes are closed, the eggs that your ovaries release cannot enter the uterus, and sperm cannot reach the released eggs. A laparoscopic tubal ligation is sometimes called "getting your tubes tied." You should not have this procedure if you want to get pregnant someday or if you are unsure about having more children. Tell a health care provider about:  Any allergies you have.  All medicines you are taking, including vitamins, herbs, eye drops, creams, and over-the-counter medicines.  Any problems you or family members have had with anesthetic medicines.  Any blood disorders you have.  Any surgeries you have had.  Any medical conditions you have.  Whether you are pregnant or may be pregnant.  Any past pregnancies. What are the risks? Generally, this is a safe procedure. However, problems may occur, including:  Infection.  Bleeding.  Injury to surrounding organs.  Side effects from anesthetics.  Failure of the procedure.  This procedure can increase your risk of a kind of pregnancy in which a fertilized egg attaches to the outside  of the uterus (ectopic pregnancy). What happens before the procedure?  Ask your health care provider about: ? Changing or stopping your regular medicines. This is especially important if you are taking diabetes medicines or blood thinners. ? Taking medicines such as aspirin and ibuprofen. These medicines can thin your blood. Do not take these medicines before your procedure if your health care provider instructs  you not to.  Follow instructions from your health care provider about eating and drinking restrictions.  Plan to have someone take you home after the procedure.  If you go home right after the procedure, plan to have someone with you for 24 hours. What happens during the procedure?  You will be given one or more of the following: ? A medicine to help you relax (sedative). ? A medicine to numb the area (local anesthetic). ? A medicine to make you fall asleep (general anesthetic). ? A medicine that is injected into an area of your body to numb everything below the injection site (regional anesthetic).  An IV tube will be inserted into one of your veins. It will be used to give you medicines and fluids during the procedure.  Your bladder may be emptied with a small tube (catheter).  If you have been given a general anesthetic, a tube will be put down your throat to help you breathe.  Two small cuts (incisions) will be made in your lower abdomen and near your belly button.  Your abdomen will be inflated with a gas. This will let the surgeon see better and will give the surgeon room to work.  A thin, lighted tube (laparoscope) with a camera attached will be inserted into your abdomen through one of the incisions. Small instruments will be inserted through the other incision.  The fallopian tubes will be tied off, burned (cauterized), or blocked with a clip, ring, or clamp. A small portion in the center of each fallopian tube may be removed.  The gas will be released from the abdomen.  The incisions will be closed with stitches (sutures).  A bandage (dressing) will be placed over the incisions. The procedure may vary among health care providers and hospitals. What happens after the procedure?  Your blood pressure, heart rate, breathing rate, and blood oxygen level will be monitored often until the medicines you were given have worn off.  You will be given medicine to help with pain,  nausea, and vomiting as needed. This information is not intended to replace advice given to you by your health care provider. Make sure you discuss any questions you have with your health care provider. Document Released: 12/04/2000 Document Revised: 02/03/2016 Document Reviewed: 08/08/2015 Elsevier Interactive Patient Education  [employer].

## 2017-07-11 ENCOUNTER — Other Ambulatory Visit: Payer: Self-pay | Admitting: Obstetrics & Gynecology

## 2017-07-13 ENCOUNTER — Other Ambulatory Visit: Payer: Self-pay

## 2017-07-13 ENCOUNTER — Encounter (HOSPITAL_COMMUNITY): Payer: Self-pay

## 2017-07-13 ENCOUNTER — Inpatient Hospital Stay (HOSPITAL_COMMUNITY): Admission: RE | Admit: 2017-07-13 | Payer: PRIVATE HEALTH INSURANCE | Source: Ambulatory Visit

## 2017-07-13 ENCOUNTER — Encounter (HOSPITAL_COMMUNITY)
Admission: RE | Admit: 2017-07-13 | Discharge: 2017-07-13 | Disposition: A | Discharge status: Home / Self Care | Payer: PRIVATE HEALTH INSURANCE | Source: Ambulatory Visit | Attending: Obstetrics & Gynecology | Admitting: Obstetrics & Gynecology

## 2017-07-13 DIAGNOSIS — R9431 Abnormal electrocardiogram [ECG] [EKG]: Secondary | ICD-10-CM | POA: Insufficient documentation

## 2017-07-13 DIAGNOSIS — Z01812 Encounter for preprocedural laboratory examination: Secondary | ICD-10-CM | POA: Diagnosis not present

## 2017-07-13 DIAGNOSIS — Z01818 Encounter for other preprocedural examination: Secondary | ICD-10-CM | POA: Diagnosis present

## 2017-07-13 DIAGNOSIS — R000 Tachycardia, unspecified: Secondary | ICD-10-CM | POA: Diagnosis not present

## 2017-07-13 LAB — CBC WITH DIFFERENTIAL/PLATELET
BASOS PCT: 0 %
Basophils Absolute: 0 10*3/uL (ref 0.0–0.1)
Eosinophils Absolute: 0.5 10*3/uL (ref 0.0–0.7)
Eosinophils Relative: 4 %
HEMATOCRIT: 40.3 % (ref 36.0–46.0)
HEMOGLOBIN: 12.9 g/dL (ref 12.0–15.0)
LYMPHS PCT: 21 %
Lymphs Abs: 2.3 10*3/uL (ref 0.7–4.0)
MCH: 28.1 pg (ref 26.0–34.0)
MCHC: 32 g/dL (ref 30.0–36.0)
MCV: 87.8 fL (ref 78.0–100.0)
MONOS PCT: 6 %
Monocytes Absolute: 0.7 10*3/uL (ref 0.1–1.0)
NEUTROS ABS: 7.4 10*3/uL (ref 1.7–7.7)
NEUTROS PCT: 69 %
Platelets: 354 10*3/uL (ref 150–400)
RBC: 4.59 MIL/uL (ref 3.87–5.11)
RDW: 15.4 % (ref 11.5–15.5)
WBC: 10.9 10*3/uL — ABNORMAL HIGH (ref 4.0–10.5)

## 2017-07-13 LAB — BASIC METABOLIC PANEL
Anion gap: 10 (ref 5–15)
BUN: 12 mg/dL (ref 6–20)
CALCIUM: 9.1 mg/dL (ref 8.9–10.3)
CO2: 27 mmol/L (ref 22–32)
CREATININE: 0.81 mg/dL (ref 0.44–1.00)
Chloride: 101 mmol/L (ref 101–111)
GFR calc non Af Amer: >60 mL/min (ref >60)
GLUCOSE: 89 mg/dL (ref 65–99)
Potassium: 3.8 mmol/L (ref 3.5–5.1)
Sodium: 138 mmol/L (ref 135–145)

## 2017-07-13 LAB — HCG, SERUM, QUALITATIVE: Preg, Serum: NEGATIVE

## 2017-07-13 NOTE — Progress Notes (Signed)
   07/13/17 1319  OBSTRUCTIVE SLEEP APNEA  Have you ever been diagnosed with sleep apnea through a sleep study? No  Do you snore loudly (loud enough to be heard through closed doors)?  1  Do you often feel tired, fatigued, or sleepy during the daytime (such as falling asleep during driving or talking to someone)? 0  Has anyone observed you stop breathing during your sleep? 1  Do you have, or are you being treated for high blood pressure? 1  BMI more than 35 kg/m2? 1  Age > 50 (1-yes) 0  Neck circumference greater than:Female 16 inches or larger, Female 17inches or larger? 1  Female Gender (Yes=1) 0  Obstructive Sleep Apnea Score 5  Score 5 or greater  Results sent to PCP

## 2017-07-15 ENCOUNTER — Encounter: Payer: Self-pay | Admitting: Obstetrics & Gynecology

## 2017-07-18 ENCOUNTER — Encounter (HOSPITAL_COMMUNITY): Payer: Self-pay | Admitting: Anesthesiology

## 2017-07-18 ENCOUNTER — Encounter (HOSPITAL_COMMUNITY)
Admission: RE | Disposition: A | Discharge status: Home / Self Care | Payer: Self-pay | Source: Ambulatory Visit | Attending: Obstetrics & Gynecology

## 2017-07-18 ENCOUNTER — Ambulatory Visit (HOSPITAL_COMMUNITY): Payer: PRIVATE HEALTH INSURANCE | Admitting: Anesthesiology

## 2017-07-18 ENCOUNTER — Ambulatory Visit (HOSPITAL_COMMUNITY)
Admission: RE | Admit: 2017-07-18 | Discharge: 2017-07-18 | Disposition: A | Discharge status: Home / Self Care | Payer: PRIVATE HEALTH INSURANCE | Source: Ambulatory Visit | Attending: Obstetrics & Gynecology | Admitting: Obstetrics & Gynecology

## 2017-07-18 DIAGNOSIS — Z79899 Other long term (current) drug therapy: Secondary | ICD-10-CM | POA: Insufficient documentation

## 2017-07-18 DIAGNOSIS — E039 Hypothyroidism, unspecified: Secondary | ICD-10-CM | POA: Insufficient documentation

## 2017-07-18 DIAGNOSIS — F419 Anxiety disorder, unspecified: Secondary | ICD-10-CM | POA: Insufficient documentation

## 2017-07-18 DIAGNOSIS — E559 Vitamin D deficiency, unspecified: Secondary | ICD-10-CM | POA: Diagnosis not present

## 2017-07-18 DIAGNOSIS — Z302 Encounter for sterilization: Secondary | ICD-10-CM | POA: Diagnosis present

## 2017-07-18 DIAGNOSIS — F17210 Nicotine dependence, cigarettes, uncomplicated: Secondary | ICD-10-CM | POA: Diagnosis not present

## 2017-07-18 DIAGNOSIS — I10 Essential (primary) hypertension: Secondary | ICD-10-CM | POA: Insufficient documentation

## 2017-07-18 HISTORY — PX: LAPAROSCOPIC TUBAL LIGATION: SHX1937

## 2017-07-18 SURGERY — LIGATION, FALLOPIAN TUBE, LAPAROSCOPIC
Anesthesia: General | Site: Abdomen

## 2017-07-18 MED ORDER — HYDROMORPHONE HCL 1 MG/ML IJ SOLN: 0.2500 mg | INTRAMUSCULAR | Status: DC | PRN

## 2017-07-18 MED ORDER — ONDANSETRON 8 MG PO TBDP: 8.0000 mg | ORAL_TABLET | Freq: Three times a day (TID) | ORAL | 0 refills | Status: DC | PRN

## 2017-07-18 MED ORDER — KETOROLAC TROMETHAMINE 10 MG PO TABS: 10.0000 mg | ORAL_TABLET | Freq: Three times a day (TID) | ORAL | 0 refills | Status: DC | PRN

## 2017-07-18 MED ORDER — FENTANYL CITRATE (PF) 100 MCG/2ML IJ SOLN
INTRAMUSCULAR | Status: DC | PRN
  Administered 2017-07-18 (×2): 50 ug via INTRAVENOUS

## 2017-07-18 MED ORDER — SUCCINYLCHOLINE CHLORIDE 20 MG/ML IJ SOLN
INTRAMUSCULAR | Status: AC
  Filled 2017-07-18: qty 1

## 2017-07-18 MED ORDER — MIDAZOLAM HCL 2 MG/2ML IJ SOLN
INTRAMUSCULAR | Status: AC
  Filled 2017-07-18: qty 2

## 2017-07-18 MED ORDER — MIDAZOLAM HCL 5 MG/5ML IJ SOLN
INTRAMUSCULAR | Status: DC | PRN
  Administered 2017-07-18: 2 mg via INTRAVENOUS

## 2017-07-18 MED ORDER — OXYCODONE-ACETAMINOPHEN 5-325 MG PO TABS: 1.0000 | ORAL_TABLET | ORAL | 0 refills | Status: DC | PRN

## 2017-07-18 MED ORDER — OXYCODONE HCL 5 MG/5ML PO SOLN: 5.0000 mg | Freq: Once | ORAL | Status: DC | PRN

## 2017-07-18 MED ORDER — NEOSTIGMINE METHYLSULFATE 10 MG/10ML IV SOLN
INTRAVENOUS | Status: DC | PRN
  Administered 2017-07-18 (×2): 2 mg via INTRAVENOUS

## 2017-07-18 MED ORDER — GLYCOPYRROLATE 0.2 MG/ML IJ SOLN
INTRAMUSCULAR | Status: DC | PRN
  Administered 2017-07-18: 0.6 mg via INTRAVENOUS

## 2017-07-18 MED ORDER — KETOROLAC TROMETHAMINE 30 MG/ML IJ SOLN
30.0000 mg | Freq: Once | INTRAMUSCULAR | Status: AC
  Administered 2017-07-18: 30 mg via INTRAVENOUS
  Filled 2017-07-18: qty 1

## 2017-07-18 MED ORDER — CEFAZOLIN SODIUM-DEXTROSE 2-4 GM/100ML-% IV SOLN
2.0000 g | INTRAVENOUS | Status: AC
  Administered 2017-07-18: 2 g via INTRAVENOUS
  Filled 2017-07-18: qty 100

## 2017-07-18 MED ORDER — ONDANSETRON 4 MG PO TBDP
4.0000 mg | ORAL_TABLET | Freq: Once | ORAL | Status: AC
  Administered 2017-07-18: 4 mg via ORAL

## 2017-07-18 MED ORDER — ONDANSETRON 4 MG PO TBDP
ORAL_TABLET | ORAL | Status: AC
  Filled 2017-07-18: qty 1

## 2017-07-18 MED ORDER — LACTATED RINGERS IV SOLN
INTRAVENOUS | Status: DC | PRN
  Administered 2017-07-18: 09:00:00 via INTRAVENOUS

## 2017-07-18 MED ORDER — ROCURONIUM BROMIDE 50 MG/5ML IV SOLN
INTRAVENOUS | Status: AC
  Filled 2017-07-18: qty 1

## 2017-07-18 MED ORDER — LIDOCAINE HCL (PF) 1 % IJ SOLN
INTRAMUSCULAR | Status: AC
  Filled 2017-07-18: qty 5

## 2017-07-18 MED ORDER — PROPOFOL 10 MG/ML IV BOLUS
INTRAVENOUS | Status: AC
  Filled 2017-07-18: qty 20

## 2017-07-18 MED ORDER — SUGAMMADEX SODIUM 500 MG/5ML IV SOLN
INTRAVENOUS | Status: AC
  Filled 2017-07-18: qty 5

## 2017-07-18 MED ORDER — GLYCOPYRROLATE 0.2 MG/ML IJ SOLN
INTRAMUSCULAR | Status: AC
  Filled 2017-07-18: qty 3

## 2017-07-18 MED ORDER — DEXAMETHASONE SODIUM PHOSPHATE 4 MG/ML IJ SOLN
INTRAMUSCULAR | Status: AC
  Filled 2017-07-18: qty 1

## 2017-07-18 MED ORDER — MIDAZOLAM HCL 2 MG/2ML IJ SOLN
1.0000 mg | Freq: Once | INTRAMUSCULAR | Status: AC | PRN
Start: 2017-07-18 — End: 2017-07-18
  Administered 2017-07-18: 2 mg via INTRAVENOUS

## 2017-07-18 MED ORDER — SODIUM CHLORIDE 0.9 % IR SOLN
Status: DC | PRN
  Administered 2017-07-18: 1000 mL

## 2017-07-18 MED ORDER — LACTATED RINGERS IV SOLN
INTRAVENOUS | Status: DC
  Administered 2017-07-18: 10:00:00 via INTRAVENOUS

## 2017-07-18 MED ORDER — ROCURONIUM BROMIDE 100 MG/10ML IV SOLN
INTRAVENOUS | Status: DC | PRN
  Administered 2017-07-18: 5 mg via INTRAVENOUS
  Administered 2017-07-18: 30 mg via INTRAVENOUS

## 2017-07-18 MED ORDER — SUCCINYLCHOLINE CHLORIDE 20 MG/ML IJ SOLN
INTRAMUSCULAR | Status: DC | PRN
  Administered 2017-07-18: 200 mg via INTRAVENOUS

## 2017-07-18 MED ORDER — OXYCODONE HCL 5 MG PO TABS: 5.0000 mg | ORAL_TABLET | Freq: Once | ORAL | Status: DC | PRN

## 2017-07-18 MED ORDER — FENTANYL CITRATE (PF) 250 MCG/5ML IJ SOLN
INTRAMUSCULAR | Status: AC
  Filled 2017-07-18: qty 5

## 2017-07-18 MED ORDER — BUPIVACAINE LIPOSOME 1.3 % IJ SUSP
INTRAMUSCULAR | Status: AC
  Filled 2017-07-18: qty 20

## 2017-07-18 MED ORDER — PROPOFOL 10 MG/ML IV BOLUS
INTRAVENOUS | Status: DC | PRN
  Administered 2017-07-18: 200 mg via INTRAVENOUS

## 2017-07-18 SURGICAL SUPPLY — 34 items
BAG HAMPER (MISCELLANEOUS) ×3 IMPLANT
BLADE SURG SZ11 CARB STEEL (BLADE) ×3 IMPLANT
CLOSURE WOUND 1/2 X4 (GAUZE/BANDAGES/DRESSINGS) ×1
CLOTH BEACON ORANGE TIMEOUT ST (SAFETY) ×3 IMPLANT
COVER LIGHT HANDLE STERIS (MISCELLANEOUS) ×6 IMPLANT
ELECT REM PT RETURN 9FT ADLT (ELECTROSURGICAL) ×3
ELECTRODE REM PT RTRN 9FT ADLT (ELECTROSURGICAL) ×1 IMPLANT
GAUZE SPONGE 4X4 12PLY STRL (GAUZE/BANDAGES/DRESSINGS) ×3 IMPLANT
GLOVE BIOGEL PI IND STRL 7.0 (GLOVE) ×1 IMPLANT
GLOVE BIOGEL PI IND STRL 8 (GLOVE) ×1 IMPLANT
GLOVE BIOGEL PI INDICATOR 7.0 (GLOVE) ×6
GLOVE BIOGEL PI INDICATOR 8 (GLOVE) ×2
GLOVE ECLIPSE 8.0 STRL XLNG CF (GLOVE) ×3 IMPLANT
GOWN STRL REUS W/TWL LRG LVL3 (GOWN DISPOSABLE) ×3 IMPLANT
GOWN STRL REUS W/TWL XL LVL3 (GOWN DISPOSABLE) ×3 IMPLANT
INST SET LAPROSCOPIC GYN AP (KITS) ×3 IMPLANT
KIT ROOM TURNOVER AP CYSTO (KITS) ×3 IMPLANT
MANIFOLD NEPTUNE II (INSTRUMENTS) ×3 IMPLANT
NDL INSUFFLATION 14GA 120MM (NEEDLE) ×1 IMPLANT
NEEDLE INSUFFLATION 14GA 120MM (NEEDLE) ×3 IMPLANT
PACK PERI GYN (CUSTOM PROCEDURE TRAY) ×3 IMPLANT
PAD ARMBOARD 7.5X6 YLW CONV (MISCELLANEOUS) ×3 IMPLANT
SET BASIN LINEN APH (SET/KITS/TRAYS/PACK) ×3 IMPLANT
SOLUTION ANTI FOG 6CC (MISCELLANEOUS) ×3 IMPLANT
SPONGE GAUZE 2X2 8PLY STER LF (GAUZE/BANDAGES/DRESSINGS) ×1
SPONGE GAUZE 2X2 8PLY STRL LF (GAUZE/BANDAGES/DRESSINGS) ×2 IMPLANT
STAPLER VISISTAT 35W (STAPLE) ×3 IMPLANT
STRIP CLOSURE SKIN 1/2X4 (GAUZE/BANDAGES/DRESSINGS) ×1 IMPLANT
SUT VICRYL 0 UR6 27IN ABS (SUTURE) ×3 IMPLANT
SYRINGE 10CC LL (SYRINGE) IMPLANT
TAPE PAPER 2X10 WHT MICROPORE (GAUZE/BANDAGES/DRESSINGS) ×2 IMPLANT
TROCAR ENDO BLADELESS 11MM (ENDOMECHANICALS) ×3 IMPLANT
TUBING INSUF HEATED (TUBING) ×3 IMPLANT
WARMER LAPAROSCOPE (MISCELLANEOUS) ×3 IMPLANT

## 2017-07-18 NOTE — Anesthesia Procedure Notes (Signed)
Procedure Name: Intubation Date/Time: 07/18/2017 10:10 AM Performed by: Charmaine Downs, CRNA Pre-anesthesia Checklist: Patient being monitored, Suction available, Emergency Drugs available and Patient identified Patient Re-evaluated:Patient Re-evaluated prior to induction Oxygen Delivery Method: Circle system utilized Preoxygenation: Pre-oxygenation with 100% oxygen Induction Type: IV induction, Rapid sequence and Cricoid Pressure applied Ventilation: Mask ventilation with difficulty, Two handed mask ventilation required and Oral airway inserted - appropriate to patient size Laryngoscope Size: Mac and 4 Grade View: Grade II Tube type: Oral Tube size: 7.0 mm Number of attempts: 2 Placement Confirmation: ETT inserted through vocal cords under direct vision,  positive ETCO2 and breath sounds checked- equal and bilateral Secured at: 22 cm Tube secured with: Tape Dental Injury: Teeth and Oropharynx as per pre-operative assessment  Difficulty Due To: Difficulty was anticipated and Difficult Airway- due to large tongue Future Recommendations: Recommend- induction with short-acting agent, and alternative techniques readily available

## 2017-07-18 NOTE — Anesthesia Postprocedure Evaluation (Signed)
Anesthesia Post Note  Patient: [name]  Procedure(s) Performed: LAPAROSCOPIC BILATERAL TUBAL LIGATION WITH ELECTROCAUTERY FOR STERILIZATION AND (Abdomen)  Patient location during evaluation: PACU Anesthesia Type: General Level of consciousness: awake and patient cooperative Pain management: pain level controlled Vital Signs Assessment: post-procedure vital signs reviewed and stable Respiratory status: spontaneous breathing, nonlabored ventilation and respiratory function stable Cardiovascular status: blood pressure returned to baseline Postop Assessment: no apparent nausea or vomiting Anesthetic complications: no     Last Vitals:  Vitals:   07/18/17 0908 07/18/17 1115  BP: 128/75 136/78  Pulse: 80 94  Resp:  20  Temp: 36.9 C 36.8 C  SpO2: 96% 98%    Last Pain:  Vitals:   07/18/17 0908  TempSrc: Oral                 Monicka Cyran J

## 2017-07-18 NOTE — Op Note (Signed)
Preoperative Diagnosis:  Multiparous female desires permanent sterilization  Postoperative Diagnosis:  Same as above  Procedure:  Laparoscopic Bilateral Tubal Ligation using electrocautery for the purpose of permanent sterilization  Surgeon:  Jacelyn Grip MD  Anaesthesia:  LMA  Findings:  Patient had normal pelvic anatomy and no intraperitoneal abnormalities.  Description of Operation:  Patient was taken to the OR and placed into supine position where she underwent LMA anaesthesia.  She was placed in the dorsal lithotomy position and prepped and draped in the usual sterile fashion.  An incision was made in the umbilicus and dissection taken down to the rectus fascia which was incised and opened.  The non bladed trocar was then placed and the peritoneal cavity was insufflated.  The above noted findings were observed.  The Kleppinger electrocautery unit was employed and both tubes were burned to no resistance and beyond in the distal ishtmic and ampullary regions, approximately a 3.5 cm segment bilaterally.  There was good hemostasis.  The fascia, peritoneum and subcutaneous tissue were closed using 0 vicryl.  The skin was closed using staples.  The patient was awakened from anaesthesia and taken to the PACU with all counts being correct x 3.  The patient received Ancef and Toradol 30 mg IV preoperatively.  I attempted to locate her Nexplanon but could not palpate it so will remove in office   EBL minimal  [name], MD 07/18/2017 10:56 AM

## 2017-07-18 NOTE — Transfer of Care (Signed)
Immediate Anesthesia Transfer of Care Note  Patient: [name]  Procedure(s) Performed: LAPAROSCOPIC BILATERAL TUBAL LIGATION WITH ELECTROCAUTERY FOR STERILIZATION AND (Abdomen)  Patient Location: PACU  Anesthesia Type:General  Level of Consciousness: awake and patient cooperative  Airway & Oxygen Therapy: Patient Spontanous Breathing and Patient connected to face mask oxygen  Post-op Assessment: Report given to RN, Post -op Vital signs reviewed and stable and Patient moving all extremities  Post vital signs: Reviewed and stable  Last Vitals:  Vitals:   07/18/17 0908  BP: 128/75  Pulse: 80  Temp: 36.9 C  SpO2: 96%    Last Pain:  Vitals:   07/18/17 0908  TempSrc: Oral         Complications: No apparent anesthesia complications

## 2017-07-18 NOTE — Anesthesia Preprocedure Evaluation (Signed)
Anesthesia Evaluation  Patient identified by MRN, date of birth, ID band Patient awake    Airway Mallampati: I  TM Distance: >3 FB Neck ROM: Full    Dental   Pulmonary Current Smoker,    Pulmonary exam normal        Cardiovascular Exercise Tolerance: Poor hypertension, Pt. on medications Normal cardiovascular exam Rhythm:Regular Rate:Normal     Neuro/Psych PSYCHIATRIC DISORDERS Anxiety Depression    GI/Hepatic   Endo/Other  Hypothyroidism   Renal/GU Results for [name], [name] (MRN [id number]) as of 07/18/2017 09:12  07/13/2017 13:22 Sodium: 138 Potassium: 3.8 Chloride: 101 CO2: 27 Glucose: 89 BUN: 12 Creatinine: 0.81      Musculoskeletal   Abdominal Normal abdominal exam  (+) + obese,  Abdomen: soft.    Peds  Hematology  (+) anemia ,   Anesthesia Other Findings   Reproductive/Obstetrics                            Anesthesia Physical Anesthesia Plan  ASA: III  Anesthesia Plan: General   Post-op Pain Management:    Induction: Intravenous  PONV Risk Score and Plan: 2  Airway Management Planned: Oral ETT  Additional Equipment:   Intra-op Plan:   Post-operative Plan: Extubation in OR  Informed Consent: I have reviewed the patients History and Physical, chart, labs and discussed the procedure including the risks, benefits and alternatives for the proposed anesthesia with the patient or authorized representative who has indicated his/her understanding and acceptance.   Dental advisory given  Plan Discussed with: CRNA and Surgeon  Anesthesia Plan Comments:         Anesthesia Quick Evaluation

## 2017-07-18 NOTE — H&P (Signed)
Preoperative History and Physical  [name] is a 36 y.o. [id number] with Patient's last menstrual period was 06/27/2017 (approximate). admitted for a laparoscopic bilateral tubal ligation using electrocautery with removal of nexplanon.  Patient understands this is considered permanent sterilization but has a failure rate of 1/300 She chose electrocautery method over other methods including salpingectomy   PMH:    Past Medical History:  Diagnosis Date  . Contraceptive education 01/10/2016  . HSV infection   . Hypertension    chtn  . Mastitis 11/11/2015  . Nausea and vomiting 11/11/2015  . Nexplanon insertion 01/20/2016   Inserted left arm 01/20/16  . PIH (pregnancy induced hypertension)   . 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 Pregnancy induced hypertension    history  . Pregnant 03/22/2015  . Smoker   . Swollen lymph nodes 01/20/2016  . Thyroid disease   . Vitamin D deficiency 06/13/2016    PSH:     Past Surgical History:  Procedure Laterality Date  . CHOLECYSTECTOMY  2013  . DILATION AND CURETTAGE OF UTERUS      POb/GynH:      OB History    Gravida Para Term Preterm AB Living   6 4 4  0 2 4   SAB TAB Ectopic Multiple Live Births     1 0 0 4      SH:   Social History   Tobacco Use  . Smoking status: Current Some Day Smoker    Packs/day: 0.00    Years: 8.00    Pack years: 0.00    Types: Cigarettes    Start date: 06/15/2012  . Smokeless tobacco: Never Used  . Tobacco comment: 1-2 times daily  Substance Use Topics  . Alcohol use: Yes    Alcohol/week: 0.0 oz    Comment: occ  . Drug use: No    FH:    Family History  Problem Relation Age of Onset  . Cancer Maternal Grandmother   . Diabetes Maternal Grandmother   . Heart disease Paternal Grandmother   . Heart disease Paternal Grandfather      Allergies:  Allergies  Allergen Reactions  . Morphine And Related Itching    Medications:       Current Facility-Administered Medications:  .  ceFAZolin (ANCEF)  IVPB 2g/100 mL premix, 2 g, Intravenous, On Call to OR, [name], MD  Review of Systems:   Review of Systems  Constitutional: Negative for fever, chills, weight loss, malaise/fatigue and diaphoresis.  HENT: Negative for hearing loss, ear pain, nosebleeds, congestion, sore throat, neck pain, tinnitus and ear discharge.   Eyes: Negative for blurred vision, double vision, photophobia, pain, discharge and redness.  Respiratory: Negative for cough, hemoptysis, sputum production, shortness of breath, wheezing and stridor.   Cardiovascular: Negative for chest pain, palpitations, orthopnea, claudication, leg swelling and PND.  Gastrointestinal: Positive for abdominal pain. Negative for heartburn, nausea, vomiting, diarrhea, constipation, blood in stool and melena.  Genitourinary: Negative for dysuria, urgency, frequency, hematuria and flank pain.  Musculoskeletal: Negative for myalgias, back pain, joint pain and falls.  Skin: Negative for itching and rash.  Neurological: Negative for dizziness, tingling, tremors, sensory change, speech change, focal weakness, seizures, loss of consciousness, weakness and headaches.  Endo/Heme/Allergies: Negative for environmental allergies and polydipsia. Does not bruise/bleed easily.  Psychiatric/Behavioral: Negative for depression, suicidal ideas, hallucinations, memory loss and substance abuse. The patient is not nervous/anxious and does not have insomnia.      PHYSICAL EXAM:  Blood pressure 128/75, pulse 80,  temperature 98.5 F (36.9 C), temperature source Oral, last menstrual period 06/27/2017, SpO2 96 %, not currently breastfeeding.    Vitals reviewed. Constitutional: She is oriented to person, place, and time. She appears well-developed and well-nourished.  HENT:  Head: Normocephalic and atraumatic.  Right Ear: External ear normal.  Left Ear: External ear normal.  Nose: Nose normal.  Mouth/Throat: Oropharynx is clear and moist.  Eyes:  Conjunctivae and EOM are normal. Pupils are equal, round, and reactive to light. Right eye exhibits no discharge. Left eye exhibits no discharge. No scleral icterus.  Neck: Normal range of motion. Neck supple. No tracheal deviation present. No thyromegaly present.  Cardiovascular: Normal rate, regular rhythm, normal heart sounds and intact distal pulses.  Exam reveals no gallop and no friction rub.   No murmur heard. Respiratory: Effort normal and breath sounds normal. No respiratory distress. She has no wheezes. She has no rales. She exhibits no tenderness.  GI: Soft. Bowel sounds are normal. She exhibits no distension and no mass. There is tenderness. There is no rebound and no guarding.  Genitourinary:       Vulva is normal without lesions Vagina is pink moist without discharge Cervix normal in appearance and pap is normal Uterus is normal size, contour, position, consistency, mobility, non-tender Adnexa is negative with normal sized ovaries by sonogram  Musculoskeletal: Normal range of motion. She exhibits no edema and no tenderness.  Neurological: She is alert and oriented to person, place, and time. She has normal reflexes. She displays normal reflexes. No cranial nerve deficit. She exhibits normal muscle tone. Coordination normal.  Skin: Skin is warm and dry. No rash noted. No erythema. No pallor.  Psychiatric: She has a normal mood and affect. Her behavior is normal. Judgment and thought content normal.    Labs: Results for orders placed or performed during the hospital encounter of 07/13/17 (from the past 336 hour(s))  CBC with Differential/Platelet   Collection Time: 07/13/17  1:22 PM  Result Value Ref Range   WBC 10.9 (H) 4.0 - 10.5 K/uL   RBC 4.59 3.87 - 5.11 MIL/uL   Hemoglobin 12.9 12.0 - 15.0 g/dL   HCT 40.3 36.0 - 46.0 %   MCV 87.8 78.0 - 100.0 fL   MCH 28.1 26.0 - 34.0 pg   MCHC 32.0 30.0 - 36.0 g/dL   RDW 15.4 11.5 - 15.5 %   Platelets 354 150 - 400 K/uL    Neutrophils Relative % 69 %   Neutro Abs 7.4 1.7 - 7.7 K/uL   Lymphocytes Relative 21 %   Lymphs Abs 2.3 0.7 - 4.0 K/uL   Monocytes Relative 6 %   Monocytes Absolute 0.7 0.1 - 1.0 K/uL   Eosinophils Relative 4 %   Eosinophils Absolute 0.5 0.0 - 0.7 K/uL   Basophils Relative 0 %   Basophils Absolute 0.0 0.0 - 0.1 K/uL  Basic metabolic panel   Collection Time: 07/13/17  1:22 PM  Result Value Ref Range   Sodium 138 135 - 145 mmol/L   Potassium 3.8 3.5 - 5.1 mmol/L   Chloride 101 101 - 111 mmol/L   CO2 27 22 - 32 mmol/L   Glucose, Bld 89 65 - 99 mg/dL   BUN 12 6 - 20 mg/dL   Creatinine, Ser 0.81 0.44 - 1.00 mg/dL   Calcium 9.1 8.9 - 10.3 mg/dL   GFR calc non Af Amer >60 >60 mL/min   GFR calc Af Amer >60 >60 mL/min   Anion gap 10  5 - 15  hCG, serum, qualitative   Collection Time: 07/13/17  1:22 PM  Result Value Ref Range   Preg, Serum NEGATIVE NEGATIVE    EKG: Orders placed or performed during the hospital encounter of 07/13/17  . EKG 12-Lead  . EKG 12-Lead    Imaging Studies: No results found.    Assessment: Multiparous female desires permanent sterilization: opts for tubal ligation using electrocautery Desires removal of nexplanon Patient Active Problem List   Diagnosis Date Noted  . Bronchitis 06/25/2017  . Decreased libido 05/09/2017  . Spotting between menses 05/09/2017  . Body mass index 50.0-59.9, adult ([place]) 05/09/2017  . Weight gain due to medication 05/09/2017  . Anxiety 05/09/2017  . Nexplanon in place 05/09/2017  . Vitamin D deficiency 06/13/2016  . Family planning 01/20/2016  . Swollen lymph nodes 01/20/2016  . Contraceptive education 01/10/2016  . Hypothyroidism 12/20/2015  . Postpartum anemia 12/20/2015  . Mastitis 11/11/2015  . 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 Hx of preeclampsia, prior pregnancy, currently pregnant 04/21/2015  . Morbid obesity ([place]) 11/11/2014  . Essential hypertension 06/19/2014     Plan: laparoscopic bilateral tubal ligation using electrocautery for permanent sterilization Removal of nexplanon  EURE,LUTHER H 07/18/2017 9:30 AM

## 2017-07-18 NOTE — Discharge Instructions (Signed)
Laparoscopic Tubal Ligation, Care After °Refer to this sheet in the next few weeks. These instructions provide you with information about caring for yourself after your procedure. Your health care provider may also give you more specific instructions. Your treatment has been planned according to current medical practices, but problems sometimes occur. Call your health care provider if you have any problems or questions after your procedure. °What can I expect after the procedure? °After the procedure, it is common to have: °· A sore throat. °· Discomfort in your shoulder. °· Mild discomfort or cramping in your abdomen. °· Gas pains. °· Pain or soreness in the area where the surgical cut (incision) was made. °· A bloated feeling. °· Tiredness. °· Nausea. °· Vomiting. ° °Follow these instructions at home: °Medicines °· Take over-the-counter and prescription medicines only as told by your health care provider. °· Do not take aspirin because it can cause bleeding. °· Do not drive or operate heavy machinery while taking prescription pain medicine. °Activity °· Rest for the rest of the day. °· Return to your normal activities as told by your health care provider. Ask your health care provider what activities are safe for you. °Incision care ° °· Follow instructions from your health care provider about how to take care of your incision. Make sure you: °? Wash your hands with soap and water before you change your bandage (dressing). If soap and water are not available, use hand sanitizer. °? Change your dressing as told by your health care provider. °? Leave stitches (sutures) in place. They may need to stay in place for 2 weeks or longer. °· Check your incision area every day for signs of infection. Check for: °? More redness, swelling, or pain. °? More fluid or blood. °? Warmth. °? Pus or a bad smell. °Other Instructions °· Do not take baths, swim, or use a hot tub until your health care provider approves. You may take  showers. °· Keep all follow-up visits as told by your health care provider. This is important. °· Have someone help you with your daily household tasks for the first few days. °Contact a health care provider if: °· You have more redness, swelling, or pain around your incision. °· Your incision feels warm to the touch. °· You have pus or a bad smell coming from your incision. °· The edges of your incision break open after the sutures have been removed. °· Your pain does not improve after 2-3 days. °· You have a rash. °· You repeatedly become dizzy or light-headed. °· Your pain medicine is not helping. °· You are constipated. °Get help right away if: °· You have a fever. °· You faint. °· You have increasing pain in your abdomen. °· You have severe pain in one or both of your shoulders. °· You have fluid or blood coming from your sutures or from your vagina. °· You have shortness of breath or difficulty breathing. °· You have chest pain or leg pain. °· You have ongoing nausea, vomiting, or diarrhea. °This information is not intended to replace advice given to you by your health care provider. Make sure you discuss any questions you have with your health care provider. °Document Released: 03/17/2005 Document Revised: 01/31/2016 Document Reviewed: 08/08/2015 °Elsevier Interactive Patient Education © 2018 [employer] ° °

## 2017-07-19 ENCOUNTER — Encounter (HOSPITAL_COMMUNITY): Payer: Self-pay | Admitting: Obstetrics & Gynecology

## 2017-07-26 ENCOUNTER — Ambulatory Visit (INDEPENDENT_AMBULATORY_CARE_PROVIDER_SITE_OTHER): Payer: PRIVATE HEALTH INSURANCE | Admitting: Obstetrics & Gynecology

## 2017-07-26 ENCOUNTER — Encounter: Payer: Self-pay | Admitting: Obstetrics & Gynecology

## 2017-07-26 VITALS — BP 138/80 | HR 88 | Ht 65.0 in | Wt 350.0 lb

## 2017-07-26 DIAGNOSIS — Z3046 Encounter for surveillance of implantable subdermal contraceptive: Secondary | ICD-10-CM

## 2017-07-26 NOTE — Progress Notes (Signed)
Patient had a tubal ligation performed last week at the hospital and I was unable to identify her Nexplanon  As a result we found today with her help and its marked Betadine prep was performed 3 cc 1% lidocaine is injected Small stab incision with a #11 blade is made Needle driver issues and the Nexplanon was removed with 1 pass without difficulty Pressures healed Steri-Strips placed Bandage placed I told [name] leave it on for 2 days with a course of the probably office afternoon  Staples are removed from her umbilical incision Gentian violet is placed  Abdominal exam is benign  We will see [name] back as needed or 1 year for yearly exam

## 2018-05-30 ENCOUNTER — Other Ambulatory Visit: Payer: Self-pay | Admitting: Adult Health

## 2020-12-06 ENCOUNTER — Encounter (INDEPENDENT_AMBULATORY_CARE_PROVIDER_SITE_OTHER): Payer: Self-pay | Admitting: Gastroenterology

## 2020-12-06 ENCOUNTER — Encounter (INDEPENDENT_AMBULATORY_CARE_PROVIDER_SITE_OTHER): Payer: Self-pay

## 2020-12-06 ENCOUNTER — Other Ambulatory Visit (INDEPENDENT_AMBULATORY_CARE_PROVIDER_SITE_OTHER): Payer: Self-pay

## 2020-12-06 ENCOUNTER — Ambulatory Visit (INDEPENDENT_AMBULATORY_CARE_PROVIDER_SITE_OTHER): Payer: Medicaid Other | Admitting: Gastroenterology

## 2020-12-06 ENCOUNTER — Other Ambulatory Visit: Payer: Self-pay

## 2020-12-06 DIAGNOSIS — R112 Nausea with vomiting, unspecified: Secondary | ICD-10-CM | POA: Insufficient documentation

## 2020-12-06 LAB — CBC WITH DIFFERENTIAL/PLATELET
Absolute Monocytes: 1120 cells/uL — ABNORMAL HIGH (ref 200–950)
Basophils Absolute: 67 cells/uL (ref 0–200)
Eosinophils Relative: 1.2 %
MCH: 25.1 pg — ABNORMAL LOW (ref 27.0–33.0)
Monocytes Relative: 10 %
Total Lymphocyte: 12.2 %

## 2020-12-06 MED ORDER — PROMETHAZINE HCL 12.5 MG PO TABS: 12.5000 mg | ORAL_TABLET | Freq: Three times a day (TID) | ORAL | 1 refills | Status: AC | PRN

## 2020-12-06 NOTE — Progress Notes (Signed)
[name], M.D. Gastroenterology & Hepatology [facility] [address] [place],  [address] Primary Care Physician: Patient, No Pcp Per No address on file  Referring MD: self  Chief Complaint: Nausea and vomiting  History of Present Illness: [name] is a 40 y.o. female with PMH hypothyroidism, bipolar disorder and hypertension, who presents for evaluation of nausea and vomiting.  The patient reports that for the last 10 years she has presented intermittent nausea, vomiting and watery nonbloody diarrhea episodes usually after eating. She was presenting these episodes at least once a week in the past but she never sought medical care for this as she considered "this was likely related to her hypothyroidism and was not severe".  However, the patient reports that on Monday she presented new onset severe recurrent and persistent nausea and vomiting, which worsened on Wednesday. The patient reports on Wednesday she was "vomiting almost every 2 hours" and could not tolerate anything orally. Went to [facility] on [place] blood testing that showed a CBC we will also count of 12.6, hemoglobin 13.2, platelets 396, CMP showed ALT of 65, AST of 33 (previous aminotransferases at the beginning a year were within normal limits), alkaline phosphatase 113, total bilirubin 0.4, normal renal function and electrolytes, urinalysis showing proteinuria but no presence of any other alterations, negative hCG.  CT scan of the abdomen and pelvis with IV contrast showed status post cholecystectomy and midline hernias but no acute alterations per report (images are not available).. She went to her PCP who prescribed Zofran every 8 hours which helped her tolerate liquids like fruit smoothies and water. She has not presented any episodes of diarrhea this time. She feels some epigastric pain but states that this is likely related to her retching  episodes.  The patient denies having any nausea, vomiting, fever, chills, hematochezia, melena, hematemesis, abdominal distention, jaundice, pruritus or weight loss.  No sick contacts.  She recently restarted taking her Seroquel to help her sleep but started this after her episodes of vomiting.  Last EGD: 12 years ago for similar symptoms - reports she had gastritits and possibly some ulcers but no report is avaialble. Last Colonoscopy: never  FHx: neg for any gastrointestinal/liver disease, no malignancies Social: smokes 4-5 cigs per day, neg frequent alcohol or illicit drug use Surgical: cholecystectomy  Past Medical History: Past Medical History:  Diagnosis Date  . Contraceptive education 01/10/2016  . HSV infection   . Hypertension    chtn  . Mastitis 11/11/2015  . Nausea and vomiting 11/11/2015  . Nexplanon insertion 01/20/2016   Inserted left arm 01/20/16  . PIH (pregnancy induced hypertension)   . 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 Pregnancy induced hypertension    history  . Pregnant 03/22/2015  . Smoker   . Swollen lymph nodes 01/20/2016  . Thyroid disease   . Vitamin D deficiency 06/13/2016    Past Surgical History: Past Surgical History:  Procedure Laterality Date  . CHOLECYSTECTOMY  2013  . DILATION AND CURETTAGE OF UTERUS    . EXCISION OF SKIN TAG N/A 12/23/2015   Procedure: REMOVAL OF GLUTEAL CREASE SKIN TAG;  Surgeon: Jonnie Kind, MD;  Location: [facility];  Service: Gynecology;  Laterality: N/A;  . HYSTEROSCOPY WITH D & C N/A 12/23/2015   Procedure: DILATATION AND CURETTAGE /HYSTEROSCOPY;  Surgeon: Jonnie Kind, MD;  Location: [facility];  Service: Gynecology;  Laterality: N/A;  . LAPAROSCOPIC TUBAL LIGATION  07/18/2017   Procedure: LAPAROSCOPIC  BILATERAL TUBAL LIGATION WITH ELECTROCAUTERY FOR STERILIZATION AND;  Surgeon: [name], MD;  Location: [facility];  Service: Gynecology;;    Family History: Family History  Problem Relation Age of Onset  . Cancer  Maternal Grandmother   . Diabetes Maternal Grandmother   . Heart disease Paternal Grandmother   . Heart disease Paternal Grandfather     Social History: Social History   Tobacco Use  Smoking Status Current Some Day Smoker  . Packs/day: 0.25  . Years: 8.00  . Pack years: 2.00  . Types: Cigarettes  . Start date: 06/15/2012  Smokeless Tobacco Never Used  Tobacco Comment   1-2 times daily   Social History   Substance and Sexual Activity  Alcohol Use Yes  . Alcohol/week: 0.0 standard drinks   Comment: occ   Social History   Substance and Sexual Activity  Drug Use No  . Frequency: 1.0 times per week    Allergies: Allergies  Allergen Reactions  . Morphine And Related Itching    Medications: Current Outpatient Medications  Medication Sig Dispense Refill  . amLODipine (NORVASC) 10 MG tablet TAKE 1 TABLET BY MOUTH ONCE DAILY 30 tablet 0  . losartan (COZAAR) 50 MG tablet TAKE 1 TABLET BY MOUTH ONCE DAILY 30 tablet 0  . ondansetron (ZOFRAN ODT) 8 MG disintegrating tablet Take 1 tablet (8 mg total) every 8 (eight) hours as needed by mouth for nausea or vomiting. 20 tablet 0  . QUEtiapine (SEROQUEL) 100 MG tablet Take 100 mg by mouth at bedtime.    . traZODone (DESYREL) 50 MG tablet Take 50 mg by mouth at bedtime.     No current facility-administered medications for this visit.    Review of Systems: GENERAL: negative for malaise, night sweats HEENT: No changes in hearing or vision, no nose bleeds or other nasal problems. NECK: Negative for lumps, goiter, pain and significant neck swelling RESPIRATORY: Negative for cough, wheezing CARDIOVASCULAR: Negative for chest pain, leg swelling, palpitations, orthopnea GI: SEE HPI MUSCULOSKELETAL: Negative for joint pain or swelling, back pain, and muscle pain. SKIN: Negative for lesions, rash PSYCH: Negative for sleep disturbance, mood disorder and recent psychosocial stressors. HEMATOLOGY Negative for prolonged bleeding, bruising  easily, and swollen nodes. ENDOCRINE: Negative for cold or heat intolerance, polyuria, polydipsia and goiter. NEURO: negative for tremor, gait imbalance, syncope and seizures. The remainder of the review of systems is noncontributory.   Physical Exam: BP (!) 147/86 (BP Location: Left Arm, Patient Position: Sitting, Cuff Size: Large)   Pulse 93   Temp 98.8 F (37.1 C) (Oral)   Ht 5\' 5"  (1.651 m)   Wt (!) 337 lb (152.9 kg)   BMI 56.08 kg/m  GENERAL: The patient is AO x3, in no acute distress. HEENT: Head is normocephalic and atraumatic. EOMI are intact. Mouth is well hydrated and without lesions. NECK: Supple. No masses LUNGS: Clear to auscultation. No presence of rhonchi/wheezing/rales. Adequate chest expansion HEART: RRR, normal s1 and s2. ABDOMEN: mildly tender in the epigastric area, no guarding, no peritoneal signs, and nondistended. BS +. No masses. EXTREMITIES: Without any cyanosis, clubbing, rash, lesions or edema. NEUROLOGIC: AOx3, no focal motor deficit. SKIN: no jaundice, no rashes   Imaging/Labs: as above  I personally reviewed and interpreted the available labs, imaging and endoscopic files.  Impression and Plan: [name] is a 40 y.o. female with PMH hypothyroidism, bipolar disorder and hypertension, who presents for evaluation of nausea and vomiting.  The patient has presented poor oral intake since  her symptoms worsened.  It is unclear to me the etiology of her current presentation.  Based on most recent abdominal imaging report, there is no presence of any acute alterations in her abdomen explain her symptoms.  However, I consider that an endoluminal evaluation should be performed at this time to evaluate disease is such as peptic ulcer disease, for which she will be scheduled for an EGD.  She will also need to follow-up with her PCP to evaluate other extraintestinal causes of nausea.  I will prescribe her Phenergan as needed to improve her symptomatology for  now.  We will also check a repeat CMP, CBC, celiac serologies.  Patient understood and agreed.  - Start promethazine every 8 hours as needed for nausea - Advance diet as tolerated - Schedule EGD - Check repeat CMP, CBC, celiac serologies.  All questions were answered.      Shelly Peppers, MD Gastroenterology and Hepatology [facility]

## 2020-12-06 NOTE — Patient Instructions (Addendum)
Start promethazine every 8 hours as needed for nausea Advance diet as tolerated Schedule EGD Perform blood workup

## 2020-12-06 NOTE — H&P (View-Only) (Signed)
[name], M.D. Gastroenterology & Hepatology [facility] [address] [place], [place] [address] Primary Care Physician: Patient, No Pcp Per No address on file  Referring MD: self  Chief Complaint: Nausea and vomiting  History of Present Illness: [name] is a 40 y.o. female with PMH hypothyroidism, bipolar disorder and hypertension, who presents for evaluation of nausea and vomiting.  The patient reports that for the last 10 years she has presented intermittent nausea, vomiting and watery nonbloody diarrhea episodes usually after eating. She was presenting these episodes at least once a week in the past but she never sought medical care for this as she considered "this was likely related to her hypothyroidism and was not severe".  However, the patient reports that on Monday she presented new onset severe recurrent and persistent nausea and vomiting, which worsened on Wednesday. The patient reports on Wednesday she was "vomiting almost every 2 hours" and could not tolerate anything orally. Went to [facility] ER on [address] blood testing that showed a CBC we will also count of 12.6, hemoglobin 13.2, platelets 396, CMP showed ALT of 65, AST of 33 (previous aminotransferases at the beginning a year were within normal limits), alkaline phosphatase 113, total bilirubin 0.4, normal renal function and electrolytes, urinalysis showing proteinuria but no presence of any other alterations, negative hCG.  CT scan of the abdomen and pelvis with IV contrast showed status post cholecystectomy and midline hernias but no acute alterations per report (images are not available).. She went to her PCP who prescribed Zofran every 8 hours which helped her tolerate liquids like fruit smoothies and water. She has not presented any episodes of diarrhea this time. She feels some epigastric pain but states that this is likely related to her retching  episodes.  The patient denies having any nausea, vomiting, fever, chills, hematochezia, melena, hematemesis, abdominal distention, jaundice, pruritus or weight loss.  No sick contacts.  She recently restarted taking her Seroquel to help her sleep but started this after her episodes of vomiting.  Last EGD: 12 years ago for similar symptoms - reports she had gastritits and possibly some ulcers but no report is avaialble. Last Colonoscopy: never  FHx: neg for any gastrointestinal/liver disease, no malignancies Social: smokes 4-5 cigs per day, neg frequent alcohol or illicit drug use Surgical: cholecystectomy  Past Medical History: Past Medical History:  Diagnosis Date  . Contraceptive education 01/10/2016  . HSV infection   . Hypertension    chtn  . Mastitis 11/11/2015  . Nausea and vomiting 11/11/2015  . Nexplanon insertion 01/20/2016   Inserted left arm 01/20/16  . PIH (pregnancy induced hypertension)   . 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 Pregnancy induced hypertension    history  . Pregnant 03/22/2015  . Smoker   . Swollen lymph nodes 01/20/2016  . Thyroid disease   . Vitamin D deficiency 06/13/2016    Past Surgical History: Past Surgical History:  Procedure Laterality Date  . CHOLECYSTECTOMY  2013  . DILATION AND CURETTAGE OF UTERUS    . EXCISION OF SKIN TAG N/A 12/23/2015   Procedure: REMOVAL OF GLUTEAL CREASE SKIN TAG;  Surgeon: Jonnie Kind, MD;  Location: [facility];  Service: Gynecology;  Laterality: N/A;  . HYSTEROSCOPY WITH D & C N/A 12/23/2015   Procedure: DILATATION AND CURETTAGE /HYSTEROSCOPY;  Surgeon: Jonnie Kind, MD;  Location: [facility];  Service: Gynecology;  Laterality: N/A;  . LAPAROSCOPIC TUBAL LIGATION  07/18/2017   Procedure: LAPAROSCOPIC  BILATERAL TUBAL LIGATION WITH ELECTROCAUTERY FOR STERILIZATION AND;  Surgeon: [name], MD;  Location: [facility];  Service: Gynecology;;    Family History: Family History  Problem Relation Age of Onset  . Cancer  Maternal Grandmother   . Diabetes Maternal Grandmother   . Heart disease Paternal Grandmother   . Heart disease Paternal Grandfather     Social History: Social History   Tobacco Use  Smoking Status Current Some Day Smoker  . Packs/day: 0.25  . Years: 8.00  . Pack years: 2.00  . Types: Cigarettes  . Start date: 06/15/2012  Smokeless Tobacco Never Used  Tobacco Comment   1-2 times daily   Social History   Substance and Sexual Activity  Alcohol Use Yes  . Alcohol/week: 0.0 standard drinks   Comment: occ   Social History   Substance and Sexual Activity  Drug Use No  . Frequency: 1.0 times per week    Allergies: Allergies  Allergen Reactions  . Morphine And Related Itching    Medications: Current Outpatient Medications  Medication Sig Dispense Refill  . amLODipine (NORVASC) 10 MG tablet TAKE 1 TABLET BY MOUTH ONCE DAILY 30 tablet 0  . losartan (COZAAR) 50 MG tablet TAKE 1 TABLET BY MOUTH ONCE DAILY 30 tablet 0  . ondansetron (ZOFRAN ODT) 8 MG disintegrating tablet Take 1 tablet (8 mg total) every 8 (eight) hours as needed by mouth for nausea or vomiting. 20 tablet 0  . QUEtiapine (SEROQUEL) 100 MG tablet Take 100 mg by mouth at bedtime.    . traZODone (DESYREL) 50 MG tablet Take 50 mg by mouth at bedtime.     No current facility-administered medications for this visit.    Review of Systems: GENERAL: negative for malaise, night sweats HEENT: No changes in hearing or vision, no nose bleeds or other nasal problems. NECK: Negative for lumps, goiter, pain and significant neck swelling RESPIRATORY: Negative for cough, wheezing CARDIOVASCULAR: Negative for chest pain, leg swelling, palpitations, orthopnea GI: SEE HPI MUSCULOSKELETAL: Negative for joint pain or swelling, back pain, and muscle pain. SKIN: Negative for lesions, rash PSYCH: Negative for sleep disturbance, mood disorder and recent psychosocial stressors. HEMATOLOGY Negative for prolonged bleeding, bruising  easily, and swollen nodes. ENDOCRINE: Negative for cold or heat intolerance, polyuria, polydipsia and goiter. NEURO: negative for tremor, gait imbalance, syncope and seizures. The remainder of the review of systems is noncontributory.   Physical Exam: BP (!) 147/86 (BP Location: Left Arm, Patient Position: Sitting, Cuff Size: Large)   Pulse 93   Temp 98.8 F (37.1 C) (Oral)   Ht 5\' 5"  (1.651 m)   Wt (!) 337 lb (152.9 kg)   BMI 56.08 kg/m  GENERAL: The patient is AO x3, in no acute distress. HEENT: Head is normocephalic and atraumatic. EOMI are intact. Mouth is well hydrated and without lesions. NECK: Supple. No masses LUNGS: Clear to auscultation. No presence of rhonchi/wheezing/rales. Adequate chest expansion HEART: RRR, normal s1 and s2. ABDOMEN: mildly tender in the epigastric area, no guarding, no peritoneal signs, and nondistended. BS +. No masses. EXTREMITIES: Without any cyanosis, clubbing, rash, lesions or edema. NEUROLOGIC: AOx3, no focal motor deficit. SKIN: no jaundice, no rashes   Imaging/Labs: as above  I personally reviewed and interpreted the available labs, imaging and endoscopic files.  Impression and Plan: [name] is a 40 y.o. female with PMH hypothyroidism, bipolar disorder and hypertension, who presents for evaluation of nausea and vomiting.  The patient has presented poor oral intake since  her symptoms worsened.  It is unclear to me the etiology of her current presentation.  Based on most recent abdominal imaging report, there is no presence of any acute alterations in her abdomen explain her symptoms.  However, I consider that an endoluminal evaluation should be performed at this time to evaluate disease is such as peptic ulcer disease, for which she will be scheduled for an EGD.  She will also need to follow-up with her PCP to evaluate other extraintestinal causes of nausea.  I will prescribe her Phenergan as needed to improve her symptomatology for  now.  We will also check a repeat CMP, CBC, celiac serologies.  Patient understood and agreed.  - Start promethazine every 8 hours as needed for nausea - Advance diet as tolerated - Schedule EGD - Check repeat CMP, CBC, celiac serologies.  All questions were answered.      [name], MD Gastroenterology and Hepatology [facility]

## 2020-12-07 LAB — COMPREHENSIVE METABOLIC PANEL
AG Ratio: 1.1 (calc) (ref 1.0–2.5)
ALT: 56 U/L — ABNORMAL HIGH (ref 6–29)
AST: 31 U/L — ABNORMAL HIGH (ref 10–30)
Albumin: 3.8 g/dL (ref 3.6–5.1)
Alkaline phosphatase (APISO): 105 U/L (ref 31–125)
BUN: 14 mg/dL (ref 7–25)
CO2: 29 mmol/L (ref 20–32)
Calcium: 9.4 mg/dL (ref 8.6–10.2)
Chloride: 90 mmol/L — ABNORMAL LOW (ref 98–110)
Creat: 0.92 mg/dL (ref 0.50–1.10)
Globulin: 3.4 g/dL (calc) (ref 1.9–3.7)
Glucose, Bld: 95 mg/dL (ref 65–139)
Potassium: 3.4 mmol/L — ABNORMAL LOW (ref 3.5–5.3)
Sodium: 130 mmol/L — ABNORMAL LOW (ref 135–146)
Total Bilirubin: 0.7 mg/dL (ref 0.2–1.2)
Total Protein: 7.2 g/dL (ref 6.1–8.1)

## 2020-12-07 LAB — CBC WITH DIFFERENTIAL/PLATELET
Basophils Relative: 0.6 %
Eosinophils Absolute: 134 cells/uL (ref 15–500)
HCT: 43.7 % (ref 35.0–45.0)
Hemoglobin: 13.7 g/dL (ref 11.7–15.5)
Lymphs Abs: 1366 cells/uL (ref 850–3900)
MCHC: 31.4 g/dL — ABNORMAL LOW (ref 32.0–36.0)
MCV: 80 fL (ref 80.0–100.0)
MPV: 11.7 fL (ref 7.5–12.5)
Neutro Abs: 8512 cells/uL — ABNORMAL HIGH (ref 1500–7800)
Neutrophils Relative %: 76 %
Platelets: 401 10*3/uL — ABNORMAL HIGH (ref 140–400)
RBC: 5.46 10*6/uL — ABNORMAL HIGH (ref 3.80–5.10)
RDW: 16.3 % — ABNORMAL HIGH (ref 11.0–15.0)
WBC: 11.2 10*3/uL — ABNORMAL HIGH (ref 3.8–10.8)

## 2020-12-07 LAB — IGA: Immunoglobulin A: 339 mg/dL — ABNORMAL HIGH (ref 47–310)

## 2020-12-07 LAB — TISSUE TRANSGLUTAMINASE, IGA: (tTG) Ab, IgA: <1 U/mL

## 2020-12-07 NOTE — Patient Instructions (Signed)
[name]  12/07/2020     @PREFPERIOPPHARMACY @   Your procedure is scheduled on  12/10/2020.   Report to [facility] at  0730  A.M.   Call this number if you have problems the morning of surgery:  [phone]   Remember:  Follow the diet and prep instructions given to you by the office.                       Take these medicines the morning of surgery with A SIP OF WATER  Amlodipine, zofran or phenergan (if needed).    Please brush your teeth.  Do not wear jewelry, make-up or nail polish.  Do not wear lotions, powders, or perfumes, or deodorant.  Do not shave 48 hours prior to surgery.  Men may shave face and neck.  Do not bring valuables to the hospital.  [facility] is not responsible for any belongings or valuables.   Contacts, dentures or bridgework may not be worn into surgery.  Leave your suitcase in the car.  After surgery it may be brought to your room.  For patients admitted to the hospital, discharge time will be determined by your treatment team.  Patients discharged the day of surgery will not be allowed to drive home and must have someone with them for 24 hours.   Special instructions:  DO NOT smoke tobacco or vape the morning of your procedure.  Please read over the following fact sheets that you were given. Anesthesia Post-op Instructions and Care and Recovery After Surgery       Upper Endoscopy, Adult, Care After This sheet gives you information about how to care for yourself after your procedure. Your health care provider may also give you more specific instructions. If you have problems or questions, contact your health care provider. What can I expect after the procedure? After the procedure, it is common to have:  A sore throat.  Mild stomach pain or discomfort.  Bloating.  Nausea. Follow these instructions at home:  Follow instructions from your health care provider about what to eat or drink after your procedure.  Return  to your normal activities as told by your health care provider. Ask your health care provider what activities are safe for you.  Take over-the-counter and prescription medicines only as told by your health care provider.  If you were given a sedative during the procedure, it can affect you for several hours. Do not drive or operate machinery until your health care provider says that it is safe.  Keep all follow-up visits as told by your health care provider. This is important.   Contact a health care provider if you have:  A sore throat that lasts longer than one day.  Trouble swallowing. Get help right away if:  You vomit blood or your vomit looks like coffee grounds.  You have: ? A fever. ? Bloody, black, or tarry stools. ? A severe sore throat or you cannot swallow. ? Difficulty breathing. ? Severe pain in your chest or abdomen. Summary  After the procedure, it is common to have a sore throat, mild stomach discomfort, bloating, and nausea.  If you were given a sedative during the procedure, it can affect you for several hours. Do not drive or operate machinery until your health care provider says that it is safe.  Follow instructions from your health care provider about what to eat or drink after your procedure.  Return to your normal activities as told by your health care provider. This information is not intended to replace advice given to you by your health care provider. Make sure you discuss any questions you have with your health care provider. Document Revised: 08/26/2019 Document Reviewed: 01/28/2018 Elsevier Patient Education  2021 [place] After This sheet gives you information about how to care for yourself after your procedure. Your health care provider may also give you more specific instructions. If you have problems or questions, contact your health care provider. What can I expect after the procedure? After the procedure,  it is common to have:  Tiredness.  Forgetfulness about what happened after the procedure.  Impaired judgment for important decisions.  Nausea or vomiting.  Some difficulty with balance. Follow these instructions at home: For the time period you were told by your health care provider:  Rest as needed.  Do not participate in activities where you could fall or become injured.  Do not drive or use machinery.  Do not drink alcohol.  Do not take sleeping pills or medicines that cause drowsiness.  Do not make important decisions or sign legal documents.  Do not take care of children on your own.      Eating and drinking  Follow the diet that is recommended by your health care provider.  Drink enough fluid to keep your urine pale yellow.  If you vomit: ? Drink water, juice, or soup when you can drink without vomiting. ? Make sure you have little or no nausea before eating solid foods. General instructions  Have a responsible adult stay with you for the time you are told. It is important to have someone help care for you until you are awake and alert.  Take over-the-counter and prescription medicines only as told by your health care provider.  If you have sleep apnea, surgery and certain medicines can increase your risk for breathing problems. Follow instructions from your health care provider about wearing your sleep device: ? Anytime you are sleeping, including during daytime naps. ? While taking prescription pain medicines, sleeping medicines, or medicines that make you drowsy.  Avoid smoking.  Keep all follow-up visits as told by your health care provider. This is important. Contact a health care provider if:  You keep feeling nauseous or you keep vomiting.  You feel light-headed.  You are still sleepy or having trouble with balance after 24 hours.  You develop a rash.  You have a fever.  You have redness or swelling around the IV site. Get help right away  if:  You have trouble breathing.  You have new-onset confusion at home. Summary  For several hours after your procedure, you may feel tired. You may also be forgetful and have poor judgment.  Have a responsible adult stay with you for the time you are told. It is important to have someone help care for you until you are awake and alert.  Rest as told. Do not drive or operate machinery. Do not drink alcohol or take sleeping pills.  Get help right away if you have trouble breathing, or if you suddenly become confused. This information is not intended to replace advice given to you by your health care provider. Make sure you discuss any questions you have with your health care provider. Document Revised: 05/13/2020 Document Reviewed: 07/31/2019 Elsevier Patient Education  2021 [employer].

## 2020-12-08 ENCOUNTER — Other Ambulatory Visit: Payer: Self-pay

## 2020-12-08 ENCOUNTER — Other Ambulatory Visit (HOSPITAL_COMMUNITY)
Admission: RE | Admit: 2020-12-08 | Discharge: 2020-12-08 | Disposition: A | Discharge status: Home / Self Care | Payer: Medicaid Other | Source: Ambulatory Visit | Attending: Gastroenterology | Admitting: Gastroenterology

## 2020-12-08 ENCOUNTER — Encounter (HOSPITAL_COMMUNITY): Payer: Self-pay

## 2020-12-08 ENCOUNTER — Encounter (HOSPITAL_COMMUNITY)
Admission: RE | Admit: 2020-12-08 | Discharge: 2020-12-08 | Disposition: A | Discharge status: Home / Self Care | Payer: Medicaid Other | Source: Ambulatory Visit | Attending: Gastroenterology | Admitting: Gastroenterology

## 2020-12-08 DIAGNOSIS — Z20822 Contact with and (suspected) exposure to covid-19: Secondary | ICD-10-CM | POA: Diagnosis not present

## 2020-12-08 DIAGNOSIS — Z01818 Encounter for other preprocedural examination: Secondary | ICD-10-CM | POA: Insufficient documentation

## 2020-12-08 HISTORY — DX: Sleep apnea, unspecified: G47.30

## 2020-12-08 LAB — SARS CORONAVIRUS 2 (TAT 6-24 HRS): SARS Coronavirus 2: NEGATIVE

## 2020-12-08 LAB — PREGNANCY, URINE: Preg Test, Ur: NEGATIVE

## 2020-12-10 ENCOUNTER — Encounter (HOSPITAL_COMMUNITY)
Admission: RE | Disposition: A | Discharge status: Home / Self Care | Payer: Self-pay | Source: Home / Self Care | Attending: Gastroenterology

## 2020-12-10 ENCOUNTER — Other Ambulatory Visit: Payer: Self-pay

## 2020-12-10 ENCOUNTER — Ambulatory Visit (HOSPITAL_COMMUNITY): Payer: Medicaid Other | Admitting: Anesthesiology

## 2020-12-10 ENCOUNTER — Encounter (HOSPITAL_COMMUNITY): Payer: Self-pay | Admitting: Gastroenterology

## 2020-12-10 ENCOUNTER — Ambulatory Visit (HOSPITAL_COMMUNITY)
Admission: RE | Admit: 2020-12-10 | Discharge: 2020-12-10 | Disposition: A | Discharge status: Home / Self Care | Payer: Medicaid Other | Attending: Gastroenterology | Admitting: Gastroenterology

## 2020-12-10 DIAGNOSIS — K3189 Other diseases of stomach and duodenum: Secondary | ICD-10-CM | POA: Diagnosis not present

## 2020-12-10 DIAGNOSIS — F17210 Nicotine dependence, cigarettes, uncomplicated: Secondary | ICD-10-CM | POA: Insufficient documentation

## 2020-12-10 DIAGNOSIS — Z79899 Other long term (current) drug therapy: Secondary | ICD-10-CM | POA: Insufficient documentation

## 2020-12-10 DIAGNOSIS — R112 Nausea with vomiting, unspecified: Secondary | ICD-10-CM | POA: Diagnosis not present

## 2020-12-10 DIAGNOSIS — Z8249 Family history of ischemic heart disease and other diseases of the circulatory system: Secondary | ICD-10-CM | POA: Insufficient documentation

## 2020-12-10 DIAGNOSIS — Z885 Allergy status to narcotic agent status: Secondary | ICD-10-CM | POA: Diagnosis not present

## 2020-12-10 DIAGNOSIS — I10 Essential (primary) hypertension: Secondary | ICD-10-CM | POA: Diagnosis not present

## 2020-12-10 DIAGNOSIS — K269 Duodenal ulcer, unspecified as acute or chronic, without hemorrhage or perforation: Secondary | ICD-10-CM | POA: Insufficient documentation

## 2020-12-10 DIAGNOSIS — E039 Hypothyroidism, unspecified: Secondary | ICD-10-CM | POA: Diagnosis not present

## 2020-12-10 DIAGNOSIS — Z8759 Personal history of other complications of pregnancy, childbirth and the puerperium: Secondary | ICD-10-CM | POA: Insufficient documentation

## 2020-12-10 DIAGNOSIS — Z9049 Acquired absence of other specified parts of digestive tract: Secondary | ICD-10-CM | POA: Insufficient documentation

## 2020-12-10 HISTORY — PX: ESOPHAGOGASTRODUODENOSCOPY (EGD) WITH PROPOFOL: SHX5813

## 2020-12-10 HISTORY — PX: BIOPSY: SHX5522

## 2020-12-10 SURGERY — ESOPHAGOGASTRODUODENOSCOPY (EGD) WITH PROPOFOL
Anesthesia: General

## 2020-12-10 MED ORDER — GLYCOPYRROLATE 0.2 MG/ML IJ SOLN
INTRAMUSCULAR | Status: DC | PRN
  Administered 2020-12-10: .2 mg via INTRAVENOUS

## 2020-12-10 MED ORDER — ESOMEPRAZOLE MAGNESIUM 40 MG PO CPDR: 40.0000 mg | DELAYED_RELEASE_CAPSULE | Freq: Two times a day (BID) | ORAL | 0 refills | Status: DC

## 2020-12-10 MED ORDER — PROPOFOL 500 MG/50ML IV EMUL
INTRAVENOUS | Status: DC | PRN
  Administered 2020-12-10: 200 ug/kg/min via INTRAVENOUS

## 2020-12-10 MED ORDER — STERILE WATER FOR IRRIGATION IR SOLN
Status: DC | PRN
  Administered 2020-12-10: 100 mL

## 2020-12-10 MED ORDER — KETAMINE HCL 10 MG/ML IJ SOLN
INTRAMUSCULAR | Status: DC | PRN
  Administered 2020-12-10: 15 mg via INTRAVENOUS

## 2020-12-10 MED ORDER — PROPOFOL 10 MG/ML IV BOLUS
INTRAVENOUS | Status: DC | PRN
  Administered 2020-12-10: 50 mg via INTRAVENOUS

## 2020-12-10 MED ORDER — KETAMINE HCL 50 MG/5ML IJ SOSY
PREFILLED_SYRINGE | INTRAMUSCULAR | Status: AC
  Filled 2020-12-10: qty 5

## 2020-12-10 MED ORDER — LIDOCAINE HCL (CARDIAC) PF 100 MG/5ML IV SOSY
PREFILLED_SYRINGE | INTRAVENOUS | Status: DC | PRN
  Administered 2020-12-10: 50 mg via INTRATRACHEAL

## 2020-12-10 MED ORDER — LACTATED RINGERS IV SOLN: INTRAVENOUS | Status: DC

## 2020-12-10 MED ORDER — LIDOCAINE VISCOUS HCL 2 % MT SOLN
15.0000 mL | Freq: Once | OROMUCOSAL | Status: AC
  Administered 2020-12-10: 15 mL via OROMUCOSAL

## 2020-12-10 NOTE — Interval H&P Note (Signed)
History and Physical Interval Note:  12/10/2020 9:23 AM [name] is a 40 y.o. female with PMH hypothyroidism, bipolar disorder and hypertension, who presents for evaluation of nausea and vomiting.  Patient reports that she has presented intermittent nausea and watery nonbloody diarrhea.  Still presenting the symptoms although she reports that the nausea is slightly better with the use of Phenergan.  Denies any abdominal pain, fever, chills, melena or hematochezia.  BP (!) 146/100   Pulse 78   Temp 98.9 F (37.2 C) (Oral)   Resp 18   LMP 11/24/2020   SpO2 98%  GENERAL: The patient is AO x3, in no acute distress. HEENT: Head is normocephalic and atraumatic. EOMI are intact. Mouth is well hydrated and without lesions. NECK: Supple. No masses LUNGS: Clear to auscultation. No presence of rhonchi/wheezing/rales. Adequate chest expansion HEART: RRR, normal s1 and s2. ABDOMEN: Soft, nontender, no guarding, no peritoneal signs, and nondistended. BS +. No masses. EXTREMITIES: Without any cyanosis, clubbing, rash, lesions or edema. NEUROLOGIC: AOx3, no focal motor deficit. SKIN: no jaundice, no rashes  [name]  has presented today for surgery, with the diagnosis of Nausea & vomiting.  The various methods of treatment have been discussed with the patient and family. After consideration of risks, benefits and other options for treatment, the patient has consented to  Procedure(s) with comments: ESOPHAGOGASTRODUODENOSCOPY (EGD) WITH PROPOFOL (N/A) - Am as a surgical intervention.  The patient's history has been reviewed, patient examined, no change in status, stable for surgery.  I have reviewed the patient's chart and labs.  Questions were answered to the patient's satisfaction.     [name] Mayorga

## 2020-12-10 NOTE — Op Note (Signed)
[facility] Patient Name: [name] Procedure Date: 12/10/2020 9:57 AM MRN: [id number] Date of Birth: Dec 05, 1980 Attending MD: [name] ,  CSN: [id number] Age: 40 Admit Type: Outpatient Procedure:                Upper GI endoscopy Indications:              Nausea with vomiting Providers:                [name], Lambert Mody Raphael Gibney,                            Technician Referring MD:              Medicines:                Monitored Anesthesia Care Complications:            No immediate complications. Estimated Blood Loss:     Estimated blood loss: none. Procedure:                Pre-Anesthesia Assessment:                           - Prior to the procedure, a History and Physical                            was performed, and patient medications, allergies                            and sensitivities were reviewed. The patient's                            tolerance of previous anesthesia was reviewed.                           - The risks and benefits of the procedure and the                            sedation options and risks were discussed with the                            patient. All questions were answered and informed                            consent was obtained.                           - ASA Grade Assessment: II - A patient with mild                            systemic disease.                           After obtaining informed consent, the endoscope was                            passed under direct vision. Throughout the  procedure, the patient's blood pressure, pulse, and                            oxygen saturations were monitored continuously. The                            [id number]-H190 ([id number]) scope was introduced through the                            mouth, and advanced to the second part of duodenum.                            The upper GI endoscopy was accomplished without                             difficulty. The patient tolerated the procedure                            well. Scope In: 10:15:35 AM Scope Out: 10:26:28 AM Total Procedure Duration: 0 hours 10 minutes 53 seconds  Findings:      The examined esophagus was normal.      A few localized 4 to 6 mm erosions with no stigmata of recent bleeding       were found in the gastric antrum. Biopsies were taken with a cold       forceps for Helicobacter pylori testing.      A few localized erosions without bleeding were found in the duodenal       bulb. Biopsies were taken with a cold forceps for histology. Impression:               - Normal esophagus.                           - Erosive gastropathy with no stigmata of recent                            bleeding. Biopsied.                           - Duodenal erosions without bleeding. Biopsied. Moderate Sedation:      Per Anesthesia Care Recommendation:           - Discharge patient to home (ambulatory).                           - Resume previous diet.                           - Await pathology results.                           - Continue present medications.                           - Start Nexium 40 mg BID for 3 months.                           -  May consider GES based on biopsy findings. Procedure Code(s):        --- Professional ---                           [phone], Esophagogastroduodenoscopy, flexible,                            transoral; with biopsy, single or multiple Diagnosis Code(s):        --- Professional ---                           K31.89, Other diseases of stomach and duodenum                           K26.9, Duodenal ulcer, unspecified as acute or                            chronic, without hemorrhage or perforation                           R11.2, Nausea with vomiting, unspecified CPT copyright 2019 American Medical Association. All rights reserved. The codes documented in this report are preliminary and upon coder review may  be revised to meet current  compliance requirements. [name], MD [name],  12/10/2020 10:33:40 AM This report has been signed electronically. Number of Addenda: 0

## 2020-12-10 NOTE — Anesthesia Preprocedure Evaluation (Signed)
Anesthesia Evaluation  Patient identified by MRN, date of birth, ID band Patient awake    Reviewed: Allergy & Precautions, NPO status , Patient's Chart, lab work & pertinent test results  History of Anesthesia Complications Negative for: history of anesthetic complications  Airway Mallampati: III  TM Distance: >3 FB Neck ROM: Full    Dental  (+) Dental Advisory Given, Teeth Intact   Pulmonary sleep apnea (Noncomplaint with CPAP) , Current Smoker and Patient abstained from smoking.,    Pulmonary exam normal breath sounds clear to auscultation       Cardiovascular Exercise Tolerance: Good hypertension, Pt. on medications Normal cardiovascular exam Rhythm:Regular Rate:Normal  08-Dec-2020 13:57:24 [facility]-OPS ROUTINE RECORD Oct 24, 1980 (9 yr) Female Black Room: Loc:905 Technician: Test ind: Vent. rate 109 BPM PR interval 166 ms QRS duration 90 ms QT/QTcB 350/471 ms P-R-T axes 40 76 -20 Sinus tachycardia ST & T wave abnormality, consider inferior ischemia Abnormal ECG Confirmed by Dixie Dials ([id number]) on 12/08/2020 8:29:55 PM   Neuro/Psych PSYCHIATRIC DISORDERS Anxiety Depression    GI/Hepatic negative GI ROS, Neg liver ROS,   Endo/Other  Hypothyroidism Morbid obesity  Renal/GU negative Renal ROS     Musculoskeletal   Abdominal   Peds  Hematology  (+) anemia ,   Anesthesia Other Findings   Reproductive/Obstetrics                            Anesthesia Physical Anesthesia Plan  ASA: III  Anesthesia Plan: General   Post-op Pain Management:    Induction: Intravenous  PONV Risk Score and Plan: Propofol infusion  Airway Management Planned: Nasal Cannula and Natural Airway  Additional Equipment:   Intra-op Plan:   Post-operative Plan:   Informed Consent: I have reviewed the patients History and Physical, chart, labs and discussed the procedure including  the risks, benefits and alternatives for the proposed anesthesia with the patient or authorized representative who has indicated his/her understanding and acceptance.     Dental advisory given  Plan Discussed with: CRNA and Surgeon  Anesthesia Plan Comments:         Anesthesia Quick Evaluation

## 2020-12-10 NOTE — Anesthesia Postprocedure Evaluation (Signed)
Anesthesia Post Note  Patient: [name]  Procedure(s) Performed: ESOPHAGOGASTRODUODENOSCOPY (EGD) WITH PROPOFOL (N/A ) BIOPSY  Patient location during evaluation: PACU Anesthesia Type: General Level of consciousness: awake and alert Pain management: pain level controlled Vital Signs Assessment: post-procedure vital signs reviewed and stable Respiratory status: spontaneous breathing, nonlabored ventilation and respiratory function stable Cardiovascular status: stable Postop Assessment: no apparent nausea or vomiting Anesthetic complications: no   No complications documented.   Last Vitals:  Vitals:   12/10/20 0905  BP: (!) 146/100  Pulse: 78  Resp: 18  Temp: 37.2 C  SpO2: 98%    Last Pain:  Vitals:   12/10/20 1012  TempSrc:   PainSc: 0-No pain                 Moorea Boissonneault Hristova

## 2020-12-10 NOTE — Discharge Instructions (Signed)
You are being discharged to home.  Resume your previous diet.  We are waiting for your pathology results.  Continue your present medications.  Start Nexium 40 mg BID for 3 months. May consider GES based on biopsy findings.  Upper Endoscopy, Adult, Care After This sheet gives you information about how to care for yourself after your procedure. Your health care provider may also give you more specific instructions. If you have problems or questions, contact your health care provider. What can I expect after the procedure? After the procedure, it is common to have:  A sore throat.  Mild stomach pain or discomfort.  Bloating.  Nausea. Follow these instructions at home:  Follow instructions from your health care provider about what to eat or drink after your procedure.  Return to your normal activities as told by your health care provider. Ask your health care provider what activities are safe for you.  Take over-the-counter and prescription medicines only as told by your health care provider.  If you were given a sedative during the procedure, it can affect you for several hours. Do not drive or operate machinery until your health care provider says that it is safe.  Keep all follow-up visits as told by your health care provider. This is important.   Contact a health care provider if you have:  A sore throat that lasts longer than one day.  Trouble swallowing. Get help right away if:  You vomit blood or your vomit looks like coffee grounds.  You have: ? A fever. ? Bloody, black, or tarry stools. ? A severe sore throat or you cannot swallow. ? Difficulty breathing. ? Severe pain in your chest or abdomen. Summary  After the procedure, it is common to have a sore throat, mild stomach discomfort, bloating, and nausea.  If you were given a sedative during the procedure, it can affect you for several hours. Do not drive or operate machinery until your health care provider  says that it is safe.  Follow instructions from your health care provider about what to eat or drink after your procedure.  Return to your normal activities as told by your health care provider. This information is not intended to replace advice given to you by your health care provider. Make sure you discuss any questions you have with your health care provider. Document Revised: 08/26/2019 Document Reviewed: 01/28/2018 Elsevier Patient Education  2021 [place] After This sheet gives you information about how to care for yourself after your procedure. Your health care provider may also give you more specific instructions. If you have problems or questions, contact your health care provider. What can I expect after the procedure? After the procedure, it is common to have:  Tiredness.  Forgetfulness about what happened after the procedure.  Impaired judgment for important decisions.  Nausea or vomiting.  Some difficulty with balance. Follow these instructions at home: For the time period you were told by your health care provider:  Rest as needed.  Do not participate in activities where you could fall or become injured.  Do not drive or use machinery.  Do not drink alcohol.  Do not take sleeping pills or medicines that cause drowsiness.  Do not make important decisions or sign legal documents.  Do not take care of children on your own.      Eating and drinking  Follow the diet that is recommended by your health care provider.  Drink enough fluid to  keep your urine pale yellow.  If you vomit: ? Drink water, juice, or soup when you can drink without vomiting. ? Make sure you have little or no nausea before eating solid foods. General instructions  Have a responsible adult stay with you for the time you are told. It is important to have someone help care for you until you are awake and alert.  Take over-the-counter and  prescription medicines only as told by your health care provider.  If you have sleep apnea, surgery and certain medicines can increase your risk for breathing problems. Follow instructions from your health care provider about wearing your sleep device: ? Anytime you are sleeping, including during daytime naps. ? While taking prescription pain medicines, sleeping medicines, or medicines that make you drowsy.  Avoid smoking.  Keep all follow-up visits as told by your health care provider. This is important. Contact a health care provider if:  You keep feeling nauseous or you keep vomiting.  You feel light-headed.  You are still sleepy or having trouble with balance after 24 hours.  You develop a rash.  You have a fever.  You have redness or swelling around the IV site. Get help right away if:  You have trouble breathing.  You have new-onset confusion at home. Summary  For several hours after your procedure, you may feel tired. You may also be forgetful and have poor judgment.  Have a responsible adult stay with you for the time you are told. It is important to have someone help care for you until you are awake and alert.  Rest as told. Do not drive or operate machinery. Do not drink alcohol or take sleeping pills.  Get help right away if you have trouble breathing, or if you suddenly become confused. This information is not intended to replace advice given to you by your health care provider. Make sure you discuss any questions you have with your health care provider. Document Revised: 05/13/2020 Document Reviewed: 07/31/2019 Elsevier Patient Education  2021 [employer].

## 2020-12-10 NOTE — Transfer of Care (Signed)
Immediate Anesthesia Transfer of Care Note  Patient: [name]  Procedure(s) Performed: ESOPHAGOGASTRODUODENOSCOPY (EGD) WITH PROPOFOL (N/A ) BIOPSY  Patient Location: PACU  Anesthesia Type:General  Level of Consciousness: awake  Airway & Oxygen Therapy: Patient Spontanous Breathing  Post-op Assessment: Report given to RN and Post -op Vital signs reviewed and stable  Post vital signs: Reviewed and stable  Last Vitals:  Vitals Value Taken Time  BP    Temp    Pulse 122 12/10/20 1034  Resp 15 12/10/20 1034  SpO2 98 % 12/10/20 1034  Vitals shown include unvalidated device data.  Last Pain:  Vitals:   12/10/20 1012  TempSrc:   PainSc: 0-No pain      Patients Stated Pain Goal: 5 (00/51/10 2111)  Complications: No complications documented.

## 2020-12-13 ENCOUNTER — Other Ambulatory Visit (INDEPENDENT_AMBULATORY_CARE_PROVIDER_SITE_OTHER): Payer: Self-pay | Admitting: Gastroenterology

## 2020-12-13 DIAGNOSIS — R112 Nausea with vomiting, unspecified: Secondary | ICD-10-CM

## 2020-12-13 LAB — SURGICAL PATHOLOGY

## 2020-12-13 NOTE — Progress Notes (Signed)
Gastric emptying study

## 2020-12-15 NOTE — Progress Notes (Signed)
Patient is scheduled on 12/24/20 patient aware

## 2020-12-16 ENCOUNTER — Encounter (HOSPITAL_COMMUNITY): Payer: Self-pay | Admitting: Gastroenterology

## 2020-12-24 ENCOUNTER — Encounter (HOSPITAL_COMMUNITY)
Admission: RE | Admit: 2020-12-24 | Discharge: 2020-12-24 | Disposition: A | Discharge status: Home / Self Care | Payer: Medicaid Other | Source: Ambulatory Visit | Attending: Gastroenterology | Admitting: Gastroenterology

## 2020-12-24 ENCOUNTER — Encounter (HOSPITAL_COMMUNITY): Payer: Self-pay

## 2020-12-24 DIAGNOSIS — R112 Nausea with vomiting, unspecified: Secondary | ICD-10-CM | POA: Diagnosis not present

## 2020-12-24 MED ORDER — TECHNETIUM TC 99M SULFUR COLLOID
2.0000 | Freq: Once | INTRAVENOUS | Status: AC | PRN
  Administered 2020-12-24: 2.15 via ORAL

## 2021-06-07 ENCOUNTER — Encounter (INDEPENDENT_AMBULATORY_CARE_PROVIDER_SITE_OTHER): Payer: Self-pay | Admitting: *Deleted

## 2021-11-07 ENCOUNTER — Other Ambulatory Visit: Payer: Medicaid Other | Admitting: Adult Health

## 2021-12-01 ENCOUNTER — Encounter (INDEPENDENT_AMBULATORY_CARE_PROVIDER_SITE_OTHER): Payer: Self-pay | Admitting: Gastroenterology

## 2021-12-01 ENCOUNTER — Ambulatory Visit (INDEPENDENT_AMBULATORY_CARE_PROVIDER_SITE_OTHER): Payer: Medicaid Other | Admitting: Gastroenterology

## 2021-12-01 ENCOUNTER — Other Ambulatory Visit: Payer: Self-pay

## 2021-12-01 VITALS — BP 161/97 | HR 79 | Temp 98.9°F | Ht 65.0 in | Wt 335.9 lb

## 2021-12-01 DIAGNOSIS — R109 Unspecified abdominal pain: Secondary | ICD-10-CM | POA: Diagnosis not present

## 2021-12-01 DIAGNOSIS — M546 Pain in thoracic spine: Secondary | ICD-10-CM | POA: Diagnosis not present

## 2021-12-01 DIAGNOSIS — R3129 Other microscopic hematuria: Secondary | ICD-10-CM

## 2021-12-01 DIAGNOSIS — R112 Nausea with vomiting, unspecified: Secondary | ICD-10-CM | POA: Diagnosis not present

## 2021-12-01 NOTE — Progress Notes (Signed)
? ?Referring Provider: [facility] ?Primary Care Physician:  [facility] ?Primary GI Physician: Jenetta Downer ? ?Chief Complaint  ?Patient presents with  ? Nausea  ?  Nausea for about one week. Abdominal pain on left side for awhile but worse about one week. Vomiting last night. Feels like a cramping pain and something swollen. Pain is worse when bending over. Went to [facility] ED last night.   ? ?HPI:   ?[name] is a 41 y.o. female with past medical history of HSV, HTN, PPD, sleep apnea, thyroid disease, vit d deficiency. ? ?Patient presenting today for left upper abdominal/side/back pain and nausea ? ?Last seen 12/06/20 for nausea and vomiting, reportedly x10 years with watery, non bloody diarrhea. At that time she had recently been evaluated at [facility] for symptoms. CT scan without acute findings. She was started on promethazine Q8H, with CMP, CBC and celiac serologies ordered as well as EGD. EGD with erosive gastropathy, started on nexium '40mg'$  BID x3 months and GES ordered. GES on 12/24/20 was WNL. Recommended repeat EGD 6 months later, however, patient lost to follow up with this. ? ?Today, patient states that she has had some pain in her Left upper abdomen/side/back area since last week. She began having nausea a few days ago, had one episode of vomiting last night. She reports that she feels a pressure like feeling under her ribs if she bends over. She states that she never took the nexium in the past she was prescribed, may have taken it a few times sporadically but ongoing nausea and vomiting at that time seemed to resolve spontaneously. She notices some heartburn very occasionally if she eats something she shouldn't and lays down too soon after. Denies dysphagia or odynophagia. Cannot pinpoint any alleviating or precipitating factors to pain/nausea. It typically last about 10 minutes. Unsure if eating makes the pain better or worse. She states that she typically does not eat from  7am to 3p. Denies constipation or diarrhea, no rectal bleeding. No frequent NSAID use. Does not drink alcohol. Does not eat a lot of spicy or greasy foods. Diet consists of more baked/grilled leaner meats, starches and veggies. Has nausea medication at home that she is using as needed. Reports that pain has not recurred since she received pain medication in the ED last night. Denies any new recent medications or lifestyle changes. Pain is not worsened with movement or reproduceable. ? ?Notably she had trace blood in her urine last night at the ED, no CT imaging done. She denies any urinary symptoms or hx of kidney stones in the past.  ? ?Last Colonoscopy:never ?Last Endoscopy:12/10/20 Normal esophagus. ?- Erosive gastropathy with no stigmata of recent bleeding. Biopsied-normal ?- Duodenal erosions without bleeding. Biopsied focal increased intraepithelial lymphocytes ? ?Past Medical History:  ?Diagnosis Date  ? Contraceptive education 01/10/2016  ? HSV infection   ? Hypertension   ? chtn  ? Mastitis 11/11/2015  ? Nausea and vomiting 11/11/2015  ? Nexplanon insertion 01/20/2016  ? Inserted left arm 01/20/16  ? PIH (pregnancy induced hypertension)   ? 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 Pregnancy induced hypertension   ? history  ? Pregnant 03/22/2015  ? Sleep apnea   ? unable to use CPAP  ? Smoker   ? Swollen lymph nodes 01/20/2016  ? Thyroid disease   ? Vitamin D deficiency 06/13/2016  ? ? ?Past Surgical History:  ?Procedure Laterality Date  ? BIOPSY  12/10/2020  ? Procedure: BIOPSY;  Surgeon: Jenetta Downer  Roney Marion, MD;  [facility];  Service: Gastroenterology;;  ? CHOLECYSTECTOMY  2013  ? DILATION AND CURETTAGE OF UTERUS    ? ESOPHAGOGASTRODUODENOSCOPY (EGD) WITH PROPOFOL N/A 12/10/2020  ? Procedure: ESOPHAGOGASTRODUODENOSCOPY (EGD) WITH PROPOFOL;  Surgeon: Harvel Quale, MD;  Location: [facility];  Service: Gastroenterology;  Laterality: N/A;  Am  ? EXCISION OF SKIN TAG N/A 12/23/2015  ? Procedure:  REMOVAL OF GLUTEAL CREASE SKIN TAG;  Surgeon: Jonnie Kind, MD;  Location: [facility];  Service: Gynecology;  Laterality: N/A;  ? HYSTEROSCOPY WITH D & C N/A 12/23/2015  ? Procedure: DILATATION AND CURETTAGE /HYSTEROSCOPY;  Surgeon: Jonnie Kind, MD;  Location: [facility];  Service: Gynecology;  Laterality: N/A;  ? LAPAROSCOPIC TUBAL LIGATION  07/18/2017  ? Procedure: LAPAROSCOPIC BILATERAL TUBAL LIGATION WITH ELECTROCAUTERY FOR STERILIZATION AND;  Surgeon: [name], MD;  Location: [facility];  Service: Gynecology;;  ? ? ?Current Outpatient Medications  ?Medication Sig Dispense Refill  ? amLODipine (NORVASC) 10 MG tablet TAKE 1 TABLET BY MOUTH ONCE DAILY (Patient taking differently: Take 10 mg by mouth daily.) 30 tablet 0  ? levothyroxine (SYNTHROID) 100 MCG tablet Take 100 mcg by mouth daily.    ? losartan (COZAAR) 50 MG tablet TAKE 1 TABLET BY MOUTH ONCE DAILY (Patient taking differently: Take 50 mg by mouth daily.) 30 tablet 0  ? Multiple Vitamin (MULTIVITAMIN WITH MINERALS) TABS tablet Take 1 tablet by mouth 3 (three) times a week.    ? ondansetron (ZOFRAN-ODT) 4 MG disintegrating tablet Take 4 mg by mouth every 8 (eight) hours as needed for nausea/vomiting.    ? promethazine (PHENERGAN) 12.5 MG tablet Take 1 tablet (12.5 mg total) by mouth every 8 (eight) hours as needed for nausea or vomiting. 90 tablet 1  ? triamterene-hydrochlorothiazide (MAXZIDE-25) 37.5-25 MG tablet Take 1 tablet by mouth daily.    ? esomeprazole (NEXIUM) 40 MG capsule Take 1 capsule (40 mg total) by mouth 2 (two) times daily before a meal. (Patient not taking: Reported on 12/01/2021) 180 capsule 0  ? QUEtiapine (SEROQUEL) 100 MG tablet Take 100 mg by mouth at bedtime as needed (sleep). (Patient not taking: Reported on 12/01/2021)    ? traZODone (DESYREL) 50 MG tablet Take 50 mg by mouth at bedtime as needed for sleep. (Patient not taking: Reported on 12/01/2021)    ? ?No current facility-administered medications for this visit.  ? ? ?Allergies  as of 12/01/2021 - Review Complete 12/01/2021  ?Allergen Reaction Noted  ? Morphine and related Itching 06/15/2014  ? ? ?Family History  ?Problem Relation Age of Onset  ? Cancer Maternal Grandmother   ? Diabetes Maternal Grandmother   ? Heart disease Paternal Grandmother   ? Heart disease Paternal Grandfather   ? ? ?Social History  ? ?Socioeconomic History  ? Marital status: Married  ?  Spouse name: Not on file  ? Number of children: Not on file  ? Years of education: Not on file  ? Highest education level: Not on file  ?Occupational History  ? Not on file  ?Tobacco Use  ? Smoking status: Some Days  ?  Packs/day: 0.25  ?  Years: 8.00  ?  Pack years: 2.00  ?  Types: Cigarettes  ?  Start date: 06/15/2012  ?  Passive exposure: Current  ? Smokeless tobacco: Never  ? Tobacco comments:  ?  1-2 times daily  ?Vaping Use  ? Vaping Use: Never used  ?Substance and Sexual Activity  ?  Alcohol use: Yes  ?  Alcohol/week: 0.0 standard drinks  ?  Comment: occ  ? Drug use: No  ?  Frequency: 1.0 times per week  ? Sexual activity: Yes  ?  Birth control/protection: Implant  ?Other Topics Concern  ? Not on file  ?Social History Narrative  ? Not on file  ? ?Social Determinants of Health  ? ?Financial Resource Strain: Not on file  ?Food Insecurity: Not on file  ?Transportation Needs: Not on file  ?Physical Activity: Not on file  ?Stress: Not on file  ?Social Connections: Not on file  ? ?Review of systems ?General: negative for malaise, night sweats, fever, chills, weight los ?Neck: Negative for lumps, goiter, pain and significant neck swelling ?Resp: Negative for cough, wheezing, dyspnea at rest ?CV: Negative for chest pain, leg swelling, palpitations, orthopnea ?GI: denies melena, hematochezia, vomiting, diarrhea, constipation, dysphagia, odyonophagia, early satiety or unintentional weight loss. +Left upper abdomen/side/back pain +nausea ?MSK: Negative for joint pain or swelling, back pain, and muscle pain. ?Derm: Negative for itching or  rash ?Psych: Denies depression, anxiety, memory loss, confusion. No homicidal or suicidal ideation.  ?Heme: Negative for prolonged bleeding, bruising easily, and swollen nodes. ?Endocrine: Negative for col

## 2021-12-01 NOTE — Patient Instructions (Signed)
Please continue to stay well hydrated and use nausea medicine as needed ?I am ordering a CT of your abdomen and pelvis to rule out GI causes/kidney stone as you had some trace amount of blood in urine at ED last night. ?Please let me know if symptoms worsen ?

## 2021-12-06 ENCOUNTER — Ambulatory Visit (HOSPITAL_BASED_OUTPATIENT_CLINIC_OR_DEPARTMENT_OTHER)
Admission: RE | Admit: 2021-12-06 | Discharge: 2021-12-06 | Disposition: A | Discharge status: Home / Self Care | Payer: Medicaid Other | Source: Ambulatory Visit | Attending: Gastroenterology | Admitting: Gastroenterology

## 2021-12-06 ENCOUNTER — Encounter (HOSPITAL_BASED_OUTPATIENT_CLINIC_OR_DEPARTMENT_OTHER): Payer: Self-pay

## 2021-12-06 ENCOUNTER — Other Ambulatory Visit: Payer: Self-pay

## 2021-12-06 DIAGNOSIS — R109 Unspecified abdominal pain: Secondary | ICD-10-CM | POA: Diagnosis present

## 2021-12-06 DIAGNOSIS — M546 Pain in thoracic spine: Secondary | ICD-10-CM | POA: Diagnosis present

## 2021-12-06 DIAGNOSIS — R112 Nausea with vomiting, unspecified: Secondary | ICD-10-CM | POA: Insufficient documentation

## 2021-12-06 MED ORDER — IOHEXOL 350 MG/ML SOLN
100.0000 mL | Freq: Once | INTRAVENOUS | Status: AC | PRN
  Administered 2021-12-06: 85 mL via INTRAVENOUS

## 2021-12-07 ENCOUNTER — Telehealth (INDEPENDENT_AMBULATORY_CARE_PROVIDER_SITE_OTHER): Payer: Self-pay | Admitting: *Deleted

## 2021-12-07 ENCOUNTER — Other Ambulatory Visit: Payer: Medicaid Other | Admitting: Adult Health

## 2021-12-07 NOTE — Telephone Encounter (Signed)
Patient called for resilts of CT ? ?Ph# [phone]  ?

## 2021-12-07 NOTE — Telephone Encounter (Signed)
Patient called for ct results. I called her back and let her know results are not yet available.  ?

## 2021-12-08 ENCOUNTER — Other Ambulatory Visit (INDEPENDENT_AMBULATORY_CARE_PROVIDER_SITE_OTHER): Payer: Self-pay | Admitting: Gastroenterology

## 2021-12-08 ENCOUNTER — Other Ambulatory Visit (INDEPENDENT_AMBULATORY_CARE_PROVIDER_SITE_OTHER): Payer: Self-pay

## 2021-12-08 DIAGNOSIS — R110 Nausea: Secondary | ICD-10-CM

## 2021-12-08 DIAGNOSIS — R1012 Left upper quadrant pain: Secondary | ICD-10-CM

## 2021-12-08 DIAGNOSIS — K3189 Other diseases of stomach and duodenum: Secondary | ICD-10-CM

## 2021-12-08 MED ORDER — ESOMEPRAZOLE MAGNESIUM 40 MG PO CPDR: 40.0000 mg | DELAYED_RELEASE_CAPSULE | Freq: Every day | ORAL | 2 refills | Status: AC

## 2021-12-09 ENCOUNTER — Encounter (INDEPENDENT_AMBULATORY_CARE_PROVIDER_SITE_OTHER): Payer: Self-pay

## 2021-12-09 ENCOUNTER — Other Ambulatory Visit: Payer: Self-pay

## 2021-12-09 ENCOUNTER — Encounter (HOSPITAL_COMMUNITY): Payer: Self-pay | Admitting: Emergency Medicine

## 2021-12-09 ENCOUNTER — Telehealth (INDEPENDENT_AMBULATORY_CARE_PROVIDER_SITE_OTHER): Payer: Self-pay

## 2021-12-09 ENCOUNTER — Emergency Department (HOSPITAL_COMMUNITY)
Admission: EM | Admit: 2021-12-09 | Discharge: 2021-12-10 | Disposition: A | Discharge status: Home / Self Care | Payer: Medicaid Other | Attending: Emergency Medicine | Admitting: Emergency Medicine

## 2021-12-09 DIAGNOSIS — R1012 Left upper quadrant pain: Secondary | ICD-10-CM | POA: Insufficient documentation

## 2021-12-09 DIAGNOSIS — R1013 Epigastric pain: Secondary | ICD-10-CM | POA: Diagnosis present

## 2021-12-09 DIAGNOSIS — R112 Nausea with vomiting, unspecified: Secondary | ICD-10-CM | POA: Insufficient documentation

## 2021-12-09 DIAGNOSIS — I10 Essential (primary) hypertension: Secondary | ICD-10-CM | POA: Diagnosis not present

## 2021-12-09 DIAGNOSIS — Z79899 Other long term (current) drug therapy: Secondary | ICD-10-CM | POA: Diagnosis not present

## 2021-12-09 DIAGNOSIS — R000 Tachycardia, unspecified: Secondary | ICD-10-CM | POA: Insufficient documentation

## 2021-12-09 DIAGNOSIS — E876 Hypokalemia: Secondary | ICD-10-CM | POA: Insufficient documentation

## 2021-12-09 DIAGNOSIS — Z87891 Personal history of nicotine dependence: Secondary | ICD-10-CM | POA: Insufficient documentation

## 2021-12-09 LAB — COMPREHENSIVE METABOLIC PANEL
ALT: 33 U/L (ref 0–44)
AST: 24 U/L (ref 15–41)
Albumin: 3.9 g/dL (ref 3.5–5.0)
Alkaline Phosphatase: 90 U/L (ref 38–126)
Anion gap: 10 (ref 5–15)
BUN: 16 mg/dL (ref 6–20)
CO2: 30 mmol/L (ref 22–32)
Calcium: 9.2 mg/dL (ref 8.9–10.3)
Chloride: 98 mmol/L (ref 98–111)
Creatinine, Ser: 1.1 mg/dL — ABNORMAL HIGH (ref 0.44–1.00)
GFR, Estimated: >60 mL/min (ref >60)
Glucose, Bld: 118 mg/dL — ABNORMAL HIGH (ref 70–99)
Potassium: 3.2 mmol/L — ABNORMAL LOW (ref 3.5–5.1)
Sodium: 138 mmol/L (ref 135–145)
Total Bilirubin: 0.5 mg/dL (ref 0.3–1.2)
Total Protein: 8.3 g/dL — ABNORMAL HIGH (ref 6.5–8.1)

## 2021-12-09 LAB — CBC WITH DIFFERENTIAL/PLATELET
Abs Immature Granulocytes: 0.05 10*3/uL (ref 0.00–0.07)
Basophils Absolute: 0.1 10*3/uL (ref 0.0–0.1)
Basophils Relative: 0 %
Eosinophils Absolute: 0.1 10*3/uL (ref 0.0–0.5)
Eosinophils Relative: 1 %
HCT: 39.4 % (ref 36.0–46.0)
Hemoglobin: 12.2 g/dL (ref 12.0–15.0)
Immature Granulocytes: 0 %
Lymphocytes Relative: 15 %
Lymphs Abs: 1.8 10*3/uL (ref 0.7–4.0)
MCH: 24.6 pg — ABNORMAL LOW (ref 26.0–34.0)
MCHC: 31 g/dL (ref 30.0–36.0)
MCV: 79.4 fL — ABNORMAL LOW (ref 80.0–100.0)
Monocytes Absolute: 0.9 10*3/uL (ref 0.1–1.0)
Monocytes Relative: 7 %
Neutro Abs: 9.2 10*3/uL — ABNORMAL HIGH (ref 1.7–7.7)
Neutrophils Relative %: 77 %
Platelets: 349 10*3/uL (ref 150–400)
RBC: 4.96 MIL/uL (ref 3.87–5.11)
RDW: 17.3 % — ABNORMAL HIGH (ref 11.5–15.5)
WBC: 12 10*3/uL — ABNORMAL HIGH (ref 4.0–10.5)
nRBC: 0 % (ref 0.0–0.2)

## 2021-12-09 LAB — LIPASE, BLOOD: Lipase: 43 U/L (ref 11–51)

## 2021-12-09 MED ORDER — SUCRALFATE 1 G PO TABS: 1.0000 g | ORAL_TABLET | Freq: Three times a day (TID) | ORAL | 0 refills | Status: DC

## 2021-12-09 MED ORDER — LIDOCAINE VISCOUS HCL 2 % MT SOLN
15.0000 mL | Freq: Once | OROMUCOSAL | Status: AC
  Administered 2021-12-09: 15 mL via ORAL
  Filled 2021-12-09: qty 15

## 2021-12-09 MED ORDER — PROCHLORPERAZINE EDISYLATE 10 MG/2ML IJ SOLN
5.0000 mg | Freq: Once | INTRAMUSCULAR | Status: AC
  Administered 2021-12-09: 5 mg via INTRAVENOUS
  Filled 2021-12-09: qty 2

## 2021-12-09 MED ORDER — SODIUM CHLORIDE 0.9 % IV BOLUS
1000.0000 mL | Freq: Once | INTRAVENOUS | Status: AC
Start: 2021-12-09 — End: 2021-12-10
  Administered 2021-12-09: 1000 mL via INTRAVENOUS

## 2021-12-09 MED ORDER — FENTANYL CITRATE PF 50 MCG/ML IJ SOSY
50.0000 ug | PREFILLED_SYRINGE | Freq: Once | INTRAMUSCULAR | Status: AC
  Administered 2021-12-09: 50 ug via INTRAVENOUS
  Filled 2021-12-09: qty 1

## 2021-12-09 MED ORDER — ALUM & MAG HYDROXIDE-SIMETH 200-200-20 MG/5ML PO SUSP
30.0000 mL | Freq: Once | ORAL | Status: AC
  Administered 2021-12-09: 30 mL via ORAL
  Filled 2021-12-09: qty 30

## 2021-12-09 NOTE — Telephone Encounter (Signed)
Patient called today stating she is still having issues with nausea,vomiting, and unable to eat she says she has stomach pain also. Egd is scheduled with Dr. Jenetta Downer on 01/03/2022 and patient last seen by [facility] on 12/01/2021. Patient wants to know what she supposed to do in mean time. Per Darius Bump this is the soonest we can get her schedule. Please advise. Thanks. ?

## 2021-12-09 NOTE — Discharge Instructions (Addendum)
Have your doctors follow your potassium.  Is a little low now but probably just due to your vomiting.  I will not do much supplementing since it can cause your potassium to go too high with the medicines you are on. ? ?Begin taking Carafate as prescribed. ? ?Follow-up with your gastroenterologist as scheduled. ?

## 2021-12-09 NOTE — ED Provider Notes (Signed)
?[facility] ?Provider Note ? ? ?CSN: [id number] ?Arrival date & time: 12/09/21  2015 ? ?  ? ?History ? ?Chief Complaint  ?Patient presents with  ? Flank Pain  ? ? ?[name] is a 41 y.o. female. ? ? ?Flank Pain ?Associated symptoms include abdominal pain. Patient presents abdominal flank pain.  Has had for about 2 weeks now.  Has been seen at [facility] ER and had negative blood work.  Has seen GI and has also had a CT scan since then that was reassuring.  Is scheduled to get endoscopy.  States continues to vomit and still have abdominal pain.  States she cannot keep food down.  States when she tries to eat the pain will come up and the food will come up.  Does not feel better after the vomiting.  She has a previous cholecystectomy. ? ?  ?Past Medical History:  ?Diagnosis Date  ? Contraceptive education 01/10/2016  ? HSV infection   ? Hypertension   ? chtn  ? Mastitis 11/11/2015  ? Nausea and vomiting 11/11/2015  ? Nexplanon insertion 01/20/2016  ? Inserted left arm 01/20/16  ? PIH (pregnancy induced hypertension)   ? 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 Pregnancy induced hypertension   ? history  ? Pregnant 03/22/2015  ? Sleep apnea   ? unable to use CPAP  ? Smoker   ? Swollen lymph nodes 01/20/2016  ? Thyroid disease   ? Vitamin D deficiency 06/13/2016  ? ?Past Surgical History:  ?Procedure Laterality Date  ? BIOPSY  12/10/2020  ? Procedure: BIOPSY;  Surgeon: Harvel Quale, MD;  [facility];  Service: Gastroenterology;;  ? CHOLECYSTECTOMY  2013  ? DILATION AND CURETTAGE OF UTERUS    ? ESOPHAGOGASTRODUODENOSCOPY (EGD) WITH PROPOFOL N/A 12/10/2020  ? Procedure: ESOPHAGOGASTRODUODENOSCOPY (EGD) WITH PROPOFOL;  Surgeon: Harvel Quale, MD;  [facility];  Service: Gastroenterology;  Laterality: N/A;  Am  ? EXCISION OF SKIN TAG N/A 12/23/2015  ? Procedure: REMOVAL OF GLUTEAL CREASE SKIN TAG;  Surgeon: Jonnie Kind, MD;  Location: [facility];  Service: Gynecology;   Laterality: N/A;  ? HYSTEROSCOPY WITH D & C N/A 12/23/2015  ? Procedure: DILATATION AND CURETTAGE /HYSTEROSCOPY;  Surgeon: Jonnie Kind, MD;  Location: [facility];  Service: Gynecology;  Laterality: N/A;  ? LAPAROSCOPIC TUBAL LIGATION  07/18/2017  ? Procedure: LAPAROSCOPIC BILATERAL TUBAL LIGATION WITH ELECTROCAUTERY FOR STERILIZATION AND;  Surgeon: [name], MD;  Location: [facility];  Service: Gynecology;;  ? ? ?Home Medications ?Prior to Admission medications   ?Medication Sig Start Date End Date Taking? Authorizing Provider  ?sucralfate (CARAFATE) 1 g tablet Take 1 tablet (1 g total) by mouth 4 (four) times daily -  with meals and at bedtime. 12/09/21  Yes Davonna Belling, MD  ?amLODipine (NORVASC) 10 MG tablet TAKE 1 TABLET BY MOUTH ONCE DAILY ?Patient taking differently: Take 10 mg by mouth daily. 05/31/18   Estill Dooms, NP  ?esomeprazole (NEXIUM) 40 MG capsule Take 1 capsule (40 mg total) by mouth daily. 12/08/21   Carlan, Deatra Robinson, NP  ?levothyroxine (SYNTHROID) 100 MCG tablet Take 100 mcg by mouth daily. 09/26/21   [provider]  ?losartan (COZAAR) 50 MG tablet TAKE 1 TABLET BY MOUTH ONCE DAILY ?Patient taking differently: Take 50 mg by mouth daily. 05/31/18   Estill Dooms, NP  ?Multiple Vitamin (MULTIVITAMIN WITH MINERALS) TABS tablet Take 1 tablet by mouth 3 (three) times a week.  [provider]  ?ondansetron (ZOFRAN-ODT) 4 MG disintegrating tablet Take 4 mg by mouth every 8 (eight) hours as needed for nausea/vomiting. 12/02/20   [provider]  ?promethazine (PHENERGAN) 12.5 MG tablet Take 1 tablet (12.5 mg total) by mouth every 8 (eight) hours as needed for nausea or vomiting. 12/06/20   Montez Morita, Quillian Quince, MD  ?QUEtiapine (SEROQUEL) 100 MG tablet Take 100 mg by mouth at bedtime as needed (sleep). ?Patient not taking: Reported on 12/01/2021    [provider]  ?traZODone (DESYREL) 50 MG tablet Take 50 mg by mouth at bedtime as needed for  sleep. ?Patient not taking: Reported on 12/01/2021    [provider]  ?triamterene-hydrochlorothiazide (MAXZIDE-25) 37.5-25 MG tablet Take 1 tablet by mouth daily.    [provider]  ?   ? ?Allergies    ?Morphine and related   ? ?Review of Systems   ?Review of Systems  ?HENT:  Negative for congestion.   ?Gastrointestinal:  Positive for abdominal pain, nausea and vomiting.  ?Genitourinary:  Positive for flank pain.  ? ?Physical Exam ?Updated Vital Signs ?BP (!) 145/107   Pulse 98   Temp 98.7 ?F (37.1 ?C) (Oral)   Resp 18   Ht '5\' 5"'$  (1.651 m)   Wt (!) 152 kg   LMP 10/23/2021 (Approximate)   SpO2 98%   BMI 55.75 kg/m?  ?Physical Exam ?Vitals and nursing note reviewed.  ?Constitutional:   ?   Appearance: She is obese.  ?HENT:  ?   Head: Atraumatic.  ?Cardiovascular:  ?   Rate and Rhythm: Regular rhythm. Tachycardia present.  ?Pulmonary:  ?   Breath sounds: No wheezing or rhonchi.  ?Abdominal:  ?   Tenderness: There is abdominal tenderness.  ?   Comments: Epigastric to left upper quadrant tenderness.  No rebound or guarding.  No hernia palpated.  ?Musculoskeletal:  ?   Right lower leg: No edema.  ?   Left lower leg: No edema.  ?Skin: ?   General: Skin is warm.  ?   Capillary Refill: Capillary refill takes less than 2 seconds.  ?Neurological:  ?   Mental Status: She is alert and oriented to person, place, and time.  ? ? ?ED Results / Procedures / Treatments   ?Labs ?(all labs ordered are listed, but only abnormal results are displayed) ?Labs Reviewed  ?COMPREHENSIVE METABOLIC PANEL - Abnormal; Notable for the following components:  ?    Result Value  ? Potassium 3.2 (*)   ? Glucose, Bld 118 (*)   ? Creatinine, Ser 1.10 (*)   ? Total Protein 8.3 (*)   ? All other components within normal limits  ?CBC WITH DIFFERENTIAL/PLATELET - Abnormal; Notable for the following components:  ? WBC 12.0 (*)   ? MCV 79.4 (*)   ? MCH 24.6 (*)   ? RDW 17.3 (*)   ? Neutro Abs 9.2 (*)   ? All other components within  normal limits  ?LIPASE, BLOOD  ?URINALYSIS, ROUTINE W REFLEX MICROSCOPIC  ? ? ?EKG ?None ? ?Radiology ?No results found. ? ?Procedures ?Procedures  ? ? ?Medications Ordered in ED ?Medications  ?sodium chloride 0.9 % bolus 1,000 mL (1,000 mLs Intravenous New Bag/Given 12/09/21 2252)  ?prochlorperazine (COMPAZINE) injection 5 mg (5 mg Intravenous Given 12/09/21 2250)  ?alum & mag hydroxide-simeth (MAALOX/MYLANTA) 200-200-20 MG/5ML suspension 30 mL (30 mLs Oral Given 12/09/21 2249)  ?  And  ?lidocaine (XYLOCAINE) 2 % viscous mouth solution 15 mL (15 mLs Oral Given  12/09/21 2249)  ?fentaNYL (SUBLIMAZE) injection 50 mcg (50 mcg Intravenous Given 12/09/21 2333)  ? ? ?ED Course/ Medical Decision Making/ A&P ?  ?                        ?Medical Decision Making ?Amount and/or Complexity of Data Reviewed ?Labs: ordered. ? ?Risk ?OTC drugs. ?Prescription drug management. ? ? ?Patient with abdominal pain.  Epigastric.  Recently had CT and also was seen at outside ER for.  Both GI and ER notes have been reviewed.  Lab work reviewed.  CT scan has not shown clear pathology.  Does have epigastric tenderness.  Normal lipase.  White count reassuring.  Slightly elevated.  Does feel somewhat better after GI cocktail.  May be a component of gastritis.  Has had previous gastritis on endoscopy.  Has follow-up endoscopy scheduled.  Mild hypokalemia.  Likely improve with improved diet.  Will not supplement right now since she is on potassium sparing diuretic.  Will likely be able to discharge home.  Urinalysis still pending.  Has given fluid bolus.  Heart rate is come down.  Care turned over to Dr. Stark Jock. ? ? ? ? ? ? ? ?Final Clinical Impression(s) / ED Diagnoses ?Final diagnoses:  ?Epigastric pain  ?Nausea and vomiting, unspecified vomiting type  ?Hypokalemia  ? ? ?Rx / DC Orders ?ED Discharge Orders   ? ?      Ordered  ?  sucralfate (CARAFATE) 1 g tablet  3 times daily with meals & bedtime       ? 12/09/21 2341  ? ?  ?  ? ?  ? ? ?  ?Davonna Belling, MD ?12/09/21 2354 ? ?

## 2021-12-09 NOTE — ED Triage Notes (Signed)
Pt c/o left flank pain x 12 days with n/v x 1 day ?

## 2021-12-10 LAB — URINALYSIS, ROUTINE W REFLEX MICROSCOPIC
Bacteria, UA: NONE SEEN
Bilirubin Urine: NEGATIVE
Glucose, UA: NEGATIVE mg/dL
Hgb urine dipstick: NEGATIVE
Ketones, ur: NEGATIVE mg/dL
Leukocytes,Ua: NEGATIVE
Nitrite: NEGATIVE
Protein, ur: 30 mg/dL — AB
Specific Gravity, Urine: 1.016 (ref 1.005–1.030)
pH: 6 (ref 5.0–8.0)

## 2021-12-10 NOTE — ED Provider Notes (Signed)
?  Physical Exam  ?BP 132/84   Pulse 73   Temp 98.7 ?F (37.1 ?C) (Oral)   Resp 17   Ht '5\' 5"'$  (1.651 m)   Wt (!) 152 kg   LMP 10/23/2021 (Approximate)   SpO2 98%   BMI 55.75 kg/m?  ? ?Physical Exam ?Vitals and nursing note reviewed.  ?Constitutional:   ?   General: She is not in acute distress. ?   Appearance: She is well-developed. She is not diaphoretic.  ?HENT:  ?   Head: Normocephalic and atraumatic.  ?Cardiovascular:  ?   Rate and Rhythm: Normal rate and regular rhythm.  ?   Heart sounds: No murmur heard. ?  No friction rub. No gallop.  ?Pulmonary:  ?   Effort: Pulmonary effort is normal. No respiratory distress.  ?   Breath sounds: Normal breath sounds. No wheezing.  ?Abdominal:  ?   General: Bowel sounds are normal. There is no distension.  ?   Palpations: Abdomen is soft.  ?   Tenderness: There is no abdominal tenderness.  ?Musculoskeletal:     ?   General: Normal range of motion.  ?   Cervical back: Normal range of motion and neck supple.  ?Skin: ?   General: Skin is warm and dry.  ?Neurological:  ?   General: No focal deficit present.  ?   Mental Status: She is alert and oriented to person, place, and time.  ? ? ?Procedures  ?Procedures ? ?ED Course / MDM  ?  ?Care assumed from Dr. Alvino Chapel at shift change.  Patient awaiting results of urinalysis.  Urinalysis shows no evidence for infection.  Patient will be discharged with Carafate and follow-up with her GI doctor. ? ? ? ? ?  ?Veryl Speak, MD ?12/10/21 [phone] ? ?

## 2021-12-12 NOTE — Telephone Encounter (Signed)
Copied and pasted message on CT results below:  ? ?Margaree Mackintosh, CMA  ?12/08/2021  2:48 PM EDT   ?  ?[name] is scheduled for a EGD w/mac on 01/03/22 and she is aware  ? Gabriel Rung, NP  ?12/08/2021 12:33 PM EDT   ?  ?I was able to connect with patient via telephone. I discussed results of CT that was unremarkable. Patient continues to have LUQ abdominal pain with nausea. I also discussed that due to pathology findings of last EGD done in 2022, she was recommended previously to undergo repeat EGD 6 months after, however, patient was lost to follow up for this. Given hx of abnormal path findings of increased intraepithelial lymphocytes and continued current GI symptoms, recommend that we proceed with EGD for further evaluation. I would also like patient to start PPI once daily. Will send omeprazole 33m to pharmacy. All questions were answered, patient verbalized understanding and is in agreement with plan as outlined above.  ?  ?[name]  ?LDarius Bump can we get 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  ?Thanks!  ? ?

## 2021-12-12 NOTE — Telephone Encounter (Signed)
Tried calling-no answer.  

## 2021-12-13 ENCOUNTER — Telehealth (INDEPENDENT_AMBULATORY_CARE_PROVIDER_SITE_OTHER): Payer: Self-pay

## 2021-12-13 NOTE — Telephone Encounter (Signed)
Patient aware of all and says she does not need any nausea medication. ?

## 2021-12-13 NOTE — Telephone Encounter (Signed)
Patient called asking for a excuse note for work from now until 01/03/2022 when her procedure is scheduled. I advised we could give a note for past times she has been out of work due to her Gi issues, but most likely would not be given one for future days that she is not under our care. I asked if she could file for Short term disability and she says no she just started the job. I advised that if she needed a note for future time she would need to speak to her boss and let them know what is going on with her, and maybe they would understand. Patient states understanding. Please advise .  ?

## 2021-12-14 NOTE — Telephone Encounter (Signed)
Patient aware of all.

## 2021-12-29 NOTE — Patient Instructions (Signed)
? ? ? ? ? ? [name] ? 12/29/2021  ?  ? '@PREFPERIOPPHARMACY'$ @ ? ? Your procedure is scheduled on  01/03/2022. ? ? Report to [facility] at  1200 P.M. ? ? Call this number if you have problems the morning of surgery: ? [phone] ? ? Remember: ? Follow the diet and prep instructions given to you by the office. ?  ? Take these medicines the morning of surgery with A SIP OF WATER  ? ?       amlodipine, nexium, levothyroxine, zofran (if needed). ?  ? Do not wear jewelry, make-up or nail polish. ? Do not wear lotions, powders, or perfumes, or deodorant. ? Do not shave 48 hours prior to surgery.  Men may shave face and neck. ? Do not bring valuables to the hospital. ? [facility] is not responsible for any belongings or valuables. ? ?Contacts, dentures or bridgework may not be worn into surgery.  Leave your suitcase in the car.  After surgery it may be brought to your room. ? ?For patients admitted to the hospital, discharge time will be determined by your treatment team. ? ?Patients discharged the day of surgery will not be allowed to drive home and must have someone with them for 24 hours.  ? ? ?Special instructions:   DO NOT smoke tobacco or vape for 24 hours before your procedure. ? ?Please read over the following fact sheets that you were given. ?Anesthesia Post-op Instructions and Care and Recovery After Surgery ?  ? ? ? Upper Endoscopy, Adult, Care After ?This sheet gives you information about how to care for yourself after your procedure. Your health care provider may also give you more specific instructions. If you have problems or questions, contact your health care provider. ?What can I expect after the procedure? ?After the procedure, it is common to have: ?A sore throat. ?Mild stomach pain or discomfort. ?Bloating. ?Nausea. ?Follow these instructions at home: ? ?Follow instructions from your health care provider about what to eat or drink after your procedure. ?Return to your normal activities as told  by your health care provider. Ask your health care provider what activities are safe for you. ?Take over-the-counter and prescription medicines only as told by your health care provider. ?If you were given a sedative during the procedure, it can affect you for several hours. Do not drive or operate machinery until your health care provider says that it is safe. ?Keep all follow-up visits as told by your health care provider. This is important. ?Contact a health care provider if you have: ?A sore throat that lasts longer than one day. ?Trouble swallowing. ?Get help right away if: ?You vomit blood or your vomit looks like coffee grounds. ?You have: ?A fever. ?Bloody, black, or tarry stools. ?A severe sore throat or you cannot swallow. ?Difficulty breathing. ?Severe pain in your chest or abdomen. ?Summary ?After the procedure, it is common to have a sore throat, mild stomach discomfort, bloating, and nausea. ?If you were given a sedative during the procedure, it can affect you for several hours. Do not drive or operate machinery until your health care provider says that it is safe. ?Follow instructions from your health care provider about what to eat or drink after your procedure. ?Return to your normal activities as told by your health care provider. ?This information is not intended to replace advice given to you by your health care provider. Make sure you discuss any questions you have with your health  care provider. ?Document Revised: 07/04/2019 Document Reviewed: 01/28/2018 ?Elsevier Patient Education ? [place]. ?Monitored Anesthesia Care, Care After ?This sheet gives you information about how to care for yourself after your procedure. Your health care provider may also give you more specific instructions. If you have problems or questions, contact your health care provider. ?What can I expect after the procedure? ?After the procedure, it is common to have: ?Tiredness. ?Forgetfulness about what happened  after the procedure. ?Impaired judgment for important decisions. ?Nausea or vomiting. ?Some difficulty with balance. ?Follow these instructions at home: ?For the time period you were told by your health care provider: ? ?  ? ?Rest as needed. ?Do not participate in activities where you could fall or become injured. ?Do not drive or use machinery. ?Do not drink alcohol. ?Do not take sleeping pills or medicines that cause drowsiness. ?Do not make important decisions or sign legal documents. ?Do not take care of children on your own. ?Eating and drinking ?Follow the diet that is recommended by your health care provider. ?Drink enough fluid to keep your urine pale yellow. ?If you vomit: ?Drink water, juice, or soup when you can drink without vomiting. ?Make sure you have little or no nausea before eating solid foods. ?General instructions ?Have a responsible adult stay with you for the time you are told. It is important to have someone help care for you until you are awake and alert. ?Take over-the-counter and prescription medicines only as told by your health care provider. ?If you have sleep apnea, surgery and certain medicines can increase your risk for breathing problems. Follow instructions from your health care provider about wearing your sleep device: ?Anytime you are sleeping, including during daytime naps. ?While taking prescription pain medicines, sleeping medicines, or medicines that make you drowsy. ?Avoid smoking. ?Keep all follow-up visits as told by your health care provider. This is important. ?Contact a health care provider if: ?You keep feeling nauseous or you keep vomiting. ?You feel light-headed. ?You are still sleepy or having trouble with balance after 24 hours. ?You develop a rash. ?You have a fever. ?You have redness or swelling around the IV site. ?Get help right away if: ?You have trouble breathing. ?You have new-onset confusion at home. ?Summary ?For several hours after your procedure, you may  feel tired. You may also be forgetful and have poor judgment. ?Have a responsible adult stay with you for the time you are told. It is important to have someone help care for you until you are awake and alert. ?Rest as told. Do not drive or operate machinery. Do not drink alcohol or take sleeping pills. ?Get help right away if you have trouble breathing, or if you suddenly become confused. ?This information is not intended to replace advice given to you by your health care provider. Make sure you discuss any questions you have with your health care provider. ?Document Revised: 08/02/2021 Document Reviewed: 07/31/2019 ?Elsevier Patient Education ? [place]. ? ?

## 2021-12-30 ENCOUNTER — Encounter (HOSPITAL_COMMUNITY): Payer: Self-pay

## 2021-12-30 ENCOUNTER — Encounter (HOSPITAL_COMMUNITY)
Admission: RE | Admit: 2021-12-30 | Discharge: 2021-12-30 | Disposition: A | Discharge status: Home / Self Care | Payer: Medicaid Other | Source: Ambulatory Visit | Attending: Gastroenterology | Admitting: Gastroenterology

## 2021-12-30 VITALS — BP 144/81 | HR 93 | Temp 98.7°F | Resp 18 | Ht 65.0 in | Wt 325.0 lb

## 2021-12-30 DIAGNOSIS — R110 Nausea: Secondary | ICD-10-CM | POA: Insufficient documentation

## 2021-12-30 DIAGNOSIS — R1012 Left upper quadrant pain: Secondary | ICD-10-CM | POA: Diagnosis not present

## 2021-12-30 DIAGNOSIS — K3189 Other diseases of stomach and duodenum: Secondary | ICD-10-CM | POA: Insufficient documentation

## 2021-12-30 DIAGNOSIS — Z01818 Encounter for other preprocedural examination: Secondary | ICD-10-CM

## 2021-12-30 DIAGNOSIS — Z79899 Other long term (current) drug therapy: Secondary | ICD-10-CM

## 2021-12-30 HISTORY — DX: Gastro-esophageal reflux disease without esophagitis: K21.9

## 2021-12-30 LAB — HCG, QUANTITATIVE, PREGNANCY: hCG, Beta Chain, Quant, S: <1 m[IU]/mL (ref <5)

## 2021-12-30 LAB — BASIC METABOLIC PANEL
Anion gap: 7 (ref 5–15)
BUN: 11 mg/dL (ref 6–20)
CO2: 26 mmol/L (ref 22–32)
Calcium: 8.8 mg/dL — ABNORMAL LOW (ref 8.9–10.3)
Chloride: 108 mmol/L (ref 98–111)
Creatinine, Ser: 0.81 mg/dL (ref 0.44–1.00)
GFR, Estimated: >60 mL/min (ref >60)
Glucose, Bld: 99 mg/dL (ref 70–99)
Potassium: 3.8 mmol/L (ref 3.5–5.1)
Sodium: 141 mmol/L (ref 135–145)

## 2022-01-03 ENCOUNTER — Encounter (HOSPITAL_COMMUNITY)
Admission: RE | Disposition: A | Discharge status: Home / Self Care | Payer: Self-pay | Source: Home / Self Care | Attending: Gastroenterology

## 2022-01-03 ENCOUNTER — Ambulatory Visit (HOSPITAL_COMMUNITY): Payer: Medicaid Other | Admitting: Anesthesiology

## 2022-01-03 ENCOUNTER — Other Ambulatory Visit: Payer: Self-pay

## 2022-01-03 ENCOUNTER — Ambulatory Visit (HOSPITAL_BASED_OUTPATIENT_CLINIC_OR_DEPARTMENT_OTHER): Payer: Medicaid Other | Admitting: Anesthesiology

## 2022-01-03 ENCOUNTER — Ambulatory Visit (HOSPITAL_COMMUNITY)
Admission: RE | Admit: 2022-01-03 | Discharge: 2022-01-03 | Disposition: A | Discharge status: Home / Self Care | Payer: Medicaid Other | Attending: Gastroenterology | Admitting: Gastroenterology

## 2022-01-03 ENCOUNTER — Encounter (HOSPITAL_COMMUNITY): Payer: Self-pay | Admitting: Gastroenterology

## 2022-01-03 DIAGNOSIS — E039 Hypothyroidism, unspecified: Secondary | ICD-10-CM | POA: Insufficient documentation

## 2022-01-03 DIAGNOSIS — Z79890 Hormone replacement therapy (postmenopausal): Secondary | ICD-10-CM | POA: Insufficient documentation

## 2022-01-03 DIAGNOSIS — F32A Depression, unspecified: Secondary | ICD-10-CM | POA: Insufficient documentation

## 2022-01-03 DIAGNOSIS — G4730 Sleep apnea, unspecified: Secondary | ICD-10-CM | POA: Insufficient documentation

## 2022-01-03 DIAGNOSIS — K219 Gastro-esophageal reflux disease without esophagitis: Secondary | ICD-10-CM | POA: Diagnosis not present

## 2022-01-03 DIAGNOSIS — D638 Anemia in other chronic diseases classified elsewhere: Secondary | ICD-10-CM | POA: Diagnosis not present

## 2022-01-03 DIAGNOSIS — Z79899 Other long term (current) drug therapy: Secondary | ICD-10-CM | POA: Diagnosis not present

## 2022-01-03 DIAGNOSIS — R110 Nausea: Secondary | ICD-10-CM | POA: Diagnosis present

## 2022-01-03 DIAGNOSIS — F419 Anxiety disorder, unspecified: Secondary | ICD-10-CM | POA: Insufficient documentation

## 2022-01-03 DIAGNOSIS — I10 Essential (primary) hypertension: Secondary | ICD-10-CM | POA: Diagnosis not present

## 2022-01-03 DIAGNOSIS — F17210 Nicotine dependence, cigarettes, uncomplicated: Secondary | ICD-10-CM | POA: Insufficient documentation

## 2022-01-03 DIAGNOSIS — Z6841 Body Mass Index (BMI) 40.0 and over, adult: Secondary | ICD-10-CM | POA: Insufficient documentation

## 2022-01-03 DIAGNOSIS — K3189 Other diseases of stomach and duodenum: Secondary | ICD-10-CM

## 2022-01-03 DIAGNOSIS — R1012 Left upper quadrant pain: Secondary | ICD-10-CM

## 2022-01-03 HISTORY — PX: ESOPHAGOGASTRODUODENOSCOPY (EGD) WITH PROPOFOL: SHX5813

## 2022-01-03 HISTORY — PX: BIOPSY: SHX5522

## 2022-01-03 SURGERY — ESOPHAGOGASTRODUODENOSCOPY (EGD) WITH PROPOFOL
Anesthesia: General

## 2022-01-03 MED ORDER — LACTATED RINGERS IV SOLN: INTRAVENOUS | Status: DC

## 2022-01-03 MED ORDER — PHENYLEPHRINE 80 MCG/ML (10ML) SYRINGE FOR IV PUSH (FOR BLOOD PRESSURE SUPPORT)
PREFILLED_SYRINGE | INTRAVENOUS | Status: AC
Start: 2022-01-03 — End: ?
  Filled 2022-01-03: qty 10

## 2022-01-03 MED ORDER — DEXAMETHASONE SODIUM PHOSPHATE 10 MG/ML IJ SOLN
INTRAMUSCULAR | Status: DC | PRN
  Administered 2022-01-03: 12 mg via INTRAVENOUS

## 2022-01-03 MED ORDER — LIDOCAINE HCL 1 % IJ SOLN
INTRAMUSCULAR | Status: DC | PRN
  Administered 2022-01-03: 75 mg via INTRADERMAL

## 2022-01-03 MED ORDER — LABETALOL HCL 5 MG/ML IV SOLN
INTRAVENOUS | Status: AC
Start: 2022-01-03 — End: ?
  Filled 2022-01-03: qty 4

## 2022-01-03 MED ORDER — LABETALOL HCL 5 MG/ML IV SOLN
INTRAVENOUS | Status: DC | PRN
Start: 2022-01-03 — End: 2022-01-03
  Administered 2022-01-03: 10 mg via INTRAVENOUS

## 2022-01-03 MED ORDER — PROPOFOL 500 MG/50ML IV EMUL
INTRAVENOUS | Status: AC
  Filled 2022-01-03: qty 100

## 2022-01-03 MED ORDER — ESMOLOL HCL 100 MG/10ML IV SOLN
INTRAVENOUS | Status: AC
Start: 2022-01-03 — End: ?
  Filled 2022-01-03: qty 10

## 2022-01-03 MED ORDER — PROPOFOL 10 MG/ML IV BOLUS
INTRAVENOUS | Status: DC | PRN
Start: 2022-01-03 — End: 2022-01-03
  Administered 2022-01-03: 50 mg via INTRAVENOUS
  Administered 2022-01-03: 100 mg via INTRAVENOUS
  Administered 2022-01-03: 50 mg via INTRAVENOUS

## 2022-01-03 MED ORDER — LIDOCAINE HCL (PF) 2 % IJ SOLN
INTRAMUSCULAR | Status: AC
Start: 2022-01-03 — End: ?
  Filled 2022-01-03: qty 15

## 2022-01-03 MED ORDER — KETAMINE HCL 50 MG/5ML IJ SOSY
PREFILLED_SYRINGE | INTRAMUSCULAR | Status: AC
  Filled 2022-01-03: qty 5

## 2022-01-03 MED ORDER — DEXAMETHASONE SODIUM PHOSPHATE 4 MG/ML IJ SOLN
INTRAMUSCULAR | Status: AC
  Filled 2022-01-03: qty 3

## 2022-01-03 MED ORDER — KETAMINE HCL 10 MG/ML IJ SOLN
INTRAMUSCULAR | Status: DC | PRN
  Administered 2022-01-03: 20 mg via INTRAVENOUS

## 2022-01-03 MED ORDER — PHENYLEPHRINE 80 MCG/ML (10ML) SYRINGE FOR IV PUSH (FOR BLOOD PRESSURE SUPPORT)
PREFILLED_SYRINGE | INTRAVENOUS | Status: AC
  Filled 2022-01-03: qty 10

## 2022-01-03 MED ORDER — SUCCINYLCHOLINE CHLORIDE 200 MG/10ML IV SOSY
PREFILLED_SYRINGE | INTRAVENOUS | Status: AC
Start: 2022-01-03 — End: ?
  Filled 2022-01-03: qty 10

## 2022-01-03 NOTE — Transfer of Care (Signed)
Immediate Anesthesia Transfer of Care Note ? ?Patient: [name] ? ?Procedure(s) Performed: ESOPHAGOGASTRODUODENOSCOPY (EGD) WITH PROPOFOL ?BIOPSY ? ?Patient Location: Short Stay ? ?Anesthesia Type:General ? ?Level of Consciousness: awake ? ?Airway & Oxygen Therapy: Patient Spontanous Breathing ? ?Post-op Assessment: Report given to RN ? ?Post vital signs: Reviewed and stable ? ?Last Vitals:  ?Vitals Value Taken Time  ?BP 141/108 01/03/22 1508  ?Temp 36.7 ?C 01/03/22 1508  ?Pulse 78 01/03/22 1511  ?Resp 12 01/03/22 1511  ?SpO2 94 % 01/03/22 1511  ?Vitals shown include unvalidated device data. ? ?Last Pain:  ?Vitals:  ? 01/03/22 1508  ?TempSrc: Oral  ?PainSc: 0-No pain  ?   ? ?Patients Stated Pain Goal: 7 (01/03/22 1222) ? ?Complications: No notable events documented. ?

## 2022-01-03 NOTE — H&P (Signed)
[name] is an 41 y.o. female.   ?Chief Complaint: nausea, lymphocytosis in duodenal biopsies ?HPI: [name] is a 41 y.o. female with past medical history of HSV, HTN, PPD, sleep apnea, thyroid disease, vit d deficiency, coming to the hospital for evaluation of nausea, lymphocytosis in duodenal biopsies. ? ?Patient reported she has been with intermittent nausea with most recent episode for the last [redacted] weeks along with some vomiting episodes.  Has been feeling better recently.  Denies any fever, chills, abdominal distention, abdominal pain, melena or hematochezia. ? ?Last EGD 12/10/20 Normal esophagus. ?- Erosive gastropathy with no stigmata of recent bleeding. Biopsied-normal ?- Duodenal erosions without bleeding. Biopsied focal increased intraepithelial lymphocytes ? ?Past Medical History:  ?Diagnosis Date  ? Contraceptive education 01/10/2016  ? GERD (gastroesophageal reflux disease)   ? HSV infection   ? Hypertension   ? chtn  ? Mastitis 11/11/2015  ? Nausea and vomiting 11/11/2015  ? Nexplanon insertion 01/20/2016  ? Inserted left arm 01/20/16  ? PIH (pregnancy induced hypertension)   ? 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 Pregnancy induced hypertension   ? history  ? Pregnant 03/22/2015  ? Sleep apnea   ? unable to use CPAP  ? Smoker   ? Swollen lymph nodes 01/20/2016  ? Thyroid disease   ? Vitamin D deficiency 06/13/2016  ? ? ?Past Surgical History:  ?Procedure Laterality Date  ? BIOPSY  12/10/2020  ? Procedure: BIOPSY;  Surgeon: Harvel Quale, MD;  Location: [facility];  Service: Gastroenterology;;  ? CHOLECYSTECTOMY  2013  ? DILATION AND CURETTAGE OF UTERUS    ? ESOPHAGOGASTRODUODENOSCOPY (EGD) WITH PROPOFOL N/A 12/10/2020  ? Procedure: ESOPHAGOGASTRODUODENOSCOPY (EGD) WITH PROPOFOL;  Surgeon: Harvel Quale, MD;  Location: [facility];  Service: Gastroenterology;  Laterality: N/A;  Am  ? EXCISION OF SKIN TAG N/A 12/23/2015  ? Procedure: REMOVAL OF GLUTEAL CREASE SKIN TAG;   Surgeon: Jonnie Kind, MD;  Location: [facility];  Service: Gynecology;  Laterality: N/A;  ? HYSTEROSCOPY WITH D & C N/A 12/23/2015  ? Procedure: DILATATION AND CURETTAGE /HYSTEROSCOPY;  Surgeon: Jonnie Kind, MD;  Location: [facility];  Service: Gynecology;  Laterality: N/A;  ? LAPAROSCOPIC TUBAL LIGATION  07/18/2017  ? Procedure: LAPAROSCOPIC BILATERAL TUBAL LIGATION WITH ELECTROCAUTERY FOR STERILIZATION AND;  Surgeon: [name], MD;  Location: [facility];  Service: Gynecology;;  ? ? ?Family History  ?Problem Relation Age of Onset  ? Cancer Maternal Grandmother   ? Diabetes Maternal Grandmother   ? Heart disease Paternal Grandmother   ? Heart disease Paternal Grandfather   ? ?Social History:  reports that she has been smoking cigarettes. She started smoking about 9 years ago. She has a 2.00 pack-year smoking history. She has been exposed to tobacco smoke. She has never used smokeless tobacco. She reports current alcohol use. She reports that she does not use drugs. ? ?Allergies:  ?Allergies  ?Allergen Reactions  ? Morphine And Related Itching  ? ? ?Medications Prior to Admission  ?Medication Sig Dispense Refill  ? amLODipine (NORVASC) 10 MG tablet TAKE 1 TABLET BY MOUTH ONCE DAILY 30 tablet 0  ? esomeprazole (NEXIUM) 40 MG capsule Take 1 capsule (40 mg total) by mouth daily. (Patient taking differently: Take 40 mg by mouth daily as needed (heartburn).) 30 capsule 2  ? levothyroxine (SYNTHROID) 100 MCG tablet Take 100 mcg by mouth daily.    ? ondansetron (ZOFRAN-ODT) 4 MG disintegrating tablet Take 4 mg by mouth every 8 (  eight) hours as needed for nausea/vomiting.    ? promethazine (PHENERGAN) 12.5 MG tablet Take 1 tablet (12.5 mg total) by mouth every 8 (eight) hours as needed for nausea or vomiting. 90 tablet 1  ? traZODone (DESYREL) 50 MG tablet Take 50 mg by mouth at bedtime as needed for sleep.    ? losartan (COZAAR) 50 MG tablet TAKE 1 TABLET BY MOUTH ONCE DAILY 30 tablet 0  ? sucralfate (CARAFATE) 1 g tablet  Take 1 tablet (1 g total) by mouth 4 (four) times daily -  with meals and at bedtime. (Patient not taking: Reported on 12/28/2021) 56 tablet 0  ? triamterene-hydrochlorothiazide (MAXZIDE-25) 37.5-25 MG tablet Take 1 tablet by mouth daily.    ? ? ?No results found for this or any previous visit (from the past 48 hour(s)). ?No results found. ? ?Review of Systems  ?Constitutional: Negative.   ?HENT: Negative.    ?Eyes: Negative.   ?Respiratory: Negative.    ?Cardiovascular: Negative.   ?Gastrointestinal:  Positive for nausea.  ?Endocrine: Negative.   ?Genitourinary: Negative.   ?Musculoskeletal: Negative.   ?Skin: Negative.   ?Allergic/Immunologic: Negative.   ?Neurological: Negative.   ?Hematological: Negative.   ?Psychiatric/Behavioral: Negative.    ? ?Blood pressure (!) 157/109, pulse 96, temperature 98.6 ?F (37 ?C), temperature source Oral, resp. rate 16, height '5\' 5"'$  (1.651 m), weight (!) 147.4 kg, last menstrual period 12/19/2021, SpO2 96 %. ?Physical Exam  ?GENERAL: The patient is AO x3, in no acute distress. Obese ?HEENT: Head is normocephalic and atraumatic. EOMI are intact. Mouth is well hydrated and without lesions. ?NECK: Supple. No masses ?LUNGS: Clear to auscultation. No presence of rhonchi/wheezing/rales. Adequate chest expansion ?HEART: RRR, normal s1 and s2. ?ABDOMEN: Soft, nontender, no guarding, no peritoneal signs, and nondistended. BS +. No masses. ?EXTREMITIES: Without any cyanosis, clubbing, rash, lesions or edema. ?NEUROLOGIC: AOx3, no focal motor deficit. ?SKIN: no jaundice, no rashes ? ?Assessment/Plan ?[name] is a 41 y.o. female with past medical history of HSV, HTN, PPD, sleep apnea, thyroid disease, vit d deficiency, coming to the hospital for evaluation of nausea, lymphocytosis in duodenal biopsies.  We will proceed with EGD. ? ?Harvel Quale, MD ?01/03/2022, 12:53 PM ? ? ? ?

## 2022-01-03 NOTE — Anesthesia Postprocedure Evaluation (Signed)
Anesthesia Post Note ? ?Patient: Shadae A Blust ? ?Procedure(s) Performed: ESOPHAGOGASTRODUODENOSCOPY (EGD) WITH PROPOFOL ?BIOPSY ? ?Patient location during evaluation: PACU ?Anesthesia Type: General ?Level of consciousness: awake and alert and oriented ?Pain management: pain level controlled ?Vital Signs Assessment: post-procedure vital signs reviewed and stable ?Respiratory status: spontaneous breathing, nonlabored ventilation and respiratory function stable ?Cardiovascular status: blood pressure returned to baseline and stable ?Postop Assessment: no apparent nausea or vomiting ?Anesthetic complications: no ? ? ?No notable events documented. ? ? ?Last Vitals:  ?Vitals:  ? 01/03/22 1500 01/03/22 1508  ?BP: (!) 137/104 (!) 141/108  ?Pulse: 79 79  ?Resp: 15 (!) 51  ?Temp:  36.7 ?C  ?SpO2: 91% 92%  ?  ?Last Pain:  ?Vitals:  ? 01/03/22 1508  ?TempSrc: Oral  ?PainSc: 0-No pain  ? ? ?  ?  ?  ?  ?  ?  ? ?Rumaysa Sabatino C [place] ? ? ? ? ?

## 2022-01-03 NOTE — Anesthesia Preprocedure Evaluation (Signed)
Anesthesia Evaluation  ?Patient identified by MRN, date of birth, ID band ?Patient awake ? ? ? ?Reviewed: ?Allergy & Precautions, NPO status , Patient's Chart, lab work & pertinent test results ? ?Airway ?Mallampati: II ? ?TM Distance: >3 FB ?Neck ROM: Full ? ? ? Dental ? ?(+) Dental Advisory Given, Missing ?  ?Pulmonary ?sleep apnea , Current Smoker and Patient abstained from smoking.,  ?  ?Pulmonary exam normal ?breath sounds clear to auscultation ? ? ? ? ? ? Cardiovascular ?Exercise Tolerance: Good ?hypertension, Pt. on medications ?Normal cardiovascular exam ?Rhythm:Regular Rate:Normal ? ? ?  ?Neuro/Psych ?PSYCHIATRIC DISORDERS Anxiety Depression negative neurological ROS ?   ? GI/Hepatic ?Neg liver ROS, GERD  Medicated,  ?Endo/Other  ?Hypothyroidism Morbid obesity ? Renal/GU ?negative Renal ROS  ?negative genitourinary ?  ?Musculoskeletal ?negative musculoskeletal ROS ?(+)  ? Abdominal ?  ?Peds ?negative pediatric ROS ?(+)  Hematology ? ?(+) Blood dyscrasia, anemia ,   ?Anesthesia Other Findings ? ? Reproductive/Obstetrics ?negative OB ROS ? ?  ? ? ? ? ? ? ? ? ? ? ? ? ? ?  ?  ? ? ? ? ? ? ? ?Anesthesia Physical ?Anesthesia Plan ? ?ASA: 3 ? ?Anesthesia Plan: General  ? ?Post-op Pain Management: Minimal or no pain anticipated  ? ?Induction: Intravenous ? ?PONV Risk Score and Plan: Propofol infusion ? ?Airway Management Planned: Nasal Cannula and Natural Airway ? ?Additional Equipment:  ? ?Intra-op Plan:  ? ?Post-operative Plan:  ? ?Informed Consent: I have reviewed the patients History and Physical, chart, labs and discussed the procedure including the risks, benefits and alternatives for the proposed anesthesia with the patient or authorized representative who has indicated his/her understanding and acceptance.  ? ? ? ?Dental advisory given ? ?Plan Discussed with: CRNA and Surgeon ? ?Anesthesia Plan Comments:   ? ? ? ? ? ? ?Anesthesia Quick Evaluation ? ?

## 2022-01-03 NOTE — Op Note (Signed)
[facility] ?Patient Name: [name] ?Procedure Date: 01/03/2022 1:51 PM ?MRN: [id number] ?Date of Birth: 19-Jul-1981 ?Attending MD: [name] ,  ?CSN: [id number] ?Age: 41 ?Admit Type: Outpatient ?Procedure:                Upper GI endoscopy ?Indications:              Nausea, intraepithelial lymphocytosis in small  ?                          bowel biopsies ?Providers:                [name], Janeece Riggers, RN, Suzan Garibaldi.  ?                          Risa Grill, Technician ?Referring MD:              ?Medicines:                General Anesthesia ?Complications:            No immediate complications. ?Estimated Blood Loss:     Estimated blood loss: none. ?Procedure:                Pre-Anesthesia Assessment: ?                          - Prior to the procedure, a History and Physical  ?                          was performed, and patient medications, allergies  ?                          and sensitivities were reviewed. The patient's  ?                          tolerance of previous anesthesia was reviewed. ?                          - The risks and benefits of the procedure and the  ?                          sedation options and risks were discussed with the  ?                          patient. All questions were answered and informed  ?                          consent was obtained. ?                          - ASA Grade Assessment: II - A patient with mild  ?                          systemic disease. ?                          After obtaining informed consent, the endoscope was  ?  passed under direct vision. Throughout the  ?                          procedure, the patient's blood pressure, pulse, and  ?                          oxygen saturations were monitored continuously. The  ?                          GIF-H190 ([id number]) scope was introduced through the  ?                          mouth, and advanced to the second part of duodenum.  ?                          The  upper GI endoscopy was performed with  ?                          difficulty due to the patient's oxygen  ?                          desaturation. Successful completion of the  ?                          procedure was aided by intubating the patient. The  ?                          patient tolerated the procedure fairly well. ?Scope In: 2:08:46 PM ?Scope Out: 2:36:56 PM ?Total Procedure Duration: 0 hours 28 minutes 10 seconds  ?Findings: ?     The examined esophagus was normal. ?     The entire examined stomach was normal. ?     The examined duodenum was normal. Biopsies (10 samples) were taken with  ?     a cold forceps for histology. ?Impression:               - Normal esophagus. ?                          - Normal stomach. ?                          - Normal examined duodenum. Biopsied. ?Moderate Sedation: ?     Per Anesthesia Care ?Recommendation:           - Discharge patient to home (ambulatory). ?                          - Resume previous diet. ?                          - Await pathology results. ?Procedure Code(s):        --- Professional --- ?                          [id number], Esophagogastroduodenoscopy, flexible,  ?  transoral; with biopsy, single or multiple ?Diagnosis Code(s):        --- Professional --- ?                          R11.0, Nausea ?CPT copyright 2019 [employer]. All rights reserved. ?The codes documented in this report are preliminary and upon coder review may  ?be revised to meet current compliance requirements. ?[name], MD ?[name],  ?01/03/2022 2:46:16 PM ?This report has been signed electronically. ?Number of Addenda: 0 ?

## 2022-01-03 NOTE — Discharge Instructions (Signed)
You are being discharged to home.  Resume your previous diet.  We are waiting for your pathology results.  

## 2022-01-03 NOTE — OR Nursing (Signed)
Patient became hypoxic anesthesiologist notified patient intubated , procedure resumed. ?

## 2022-01-03 NOTE — Anesthesia Procedure Notes (Signed)
Procedure Name: Intubation ?Date/Time: 01/03/2022 2:14 PM ?Performed by: Ollen Bowl, CRNA ?Pre-anesthesia Checklist: Patient identified, Patient being monitored, Timeout performed, Emergency Drugs available and Suction available ?Patient Re-evaluated:Patient Re-evaluated prior to induction ?Oxygen Delivery Method: Circle system utilized ?Preoxygenation: Pre-oxygenation with 100% oxygen ?Induction Type: IV induction ?Ventilation: Mask ventilation without difficulty ?Laryngoscope Size: 3 and Glidescope (glidescope 3) ?Grade View: Grade I ?Tube type: Oral ?Tube size: 7.0 mm ?Number of attempts: 1 ?Airway Equipment and Method: Stylet ?Placement Confirmation: ETT inserted through vocal cords under direct vision, positive ETCO2 and breath sounds checked- equal and bilateral ?Secured at: 21 cm ?Tube secured with: Tape ?Dental Injury: Teeth and Oropharynx as per pre-operative assessment  ? ? ? ? ?

## 2022-01-05 LAB — SURGICAL PATHOLOGY

## 2022-01-12 ENCOUNTER — Encounter: Payer: Self-pay | Admitting: Adult Health

## 2022-01-12 ENCOUNTER — Other Ambulatory Visit (HOSPITAL_COMMUNITY)
Admission: RE | Admit: 2022-01-12 | Discharge: 2022-01-12 | Disposition: A | Discharge status: Home / Self Care | Payer: Medicaid Other | Source: Ambulatory Visit | Attending: Adult Health | Admitting: Adult Health

## 2022-01-12 ENCOUNTER — Ambulatory Visit (INDEPENDENT_AMBULATORY_CARE_PROVIDER_SITE_OTHER): Payer: Medicaid Other | Admitting: Adult Health

## 2022-01-12 VITALS — BP 125/79 | HR 94 | Ht 65.5 in | Wt 325.0 lb

## 2022-01-12 DIAGNOSIS — Z0000 Encounter for general adult medical examination without abnormal findings: Secondary | ICD-10-CM

## 2022-01-12 DIAGNOSIS — Z1211 Encounter for screening for malignant neoplasm of colon: Secondary | ICD-10-CM | POA: Insufficient documentation

## 2022-01-12 DIAGNOSIS — Z01419 Encounter for gynecological examination (general) (routine) without abnormal findings: Secondary | ICD-10-CM | POA: Diagnosis not present

## 2022-01-12 LAB — HEMOCCULT GUIAC POC 1CARD (OFFICE): Fecal Occult Blood, POC: NEGATIVE

## 2022-01-12 NOTE — Progress Notes (Signed)
Patient ID: [name], female   DOB: [birth year], 41 y.o.   MRN: [id number] ?History of Present Illness: ?Modupe is a 41 year old black female, married, [id number] in for a well woman gyn exam and pap. ?She had EGD recently, since having GB removed, has issues with feeling sick and vomiting. ?PCP is [facility]. ? ? ?Current Medications, Allergies, Past Medical History, Past Surgical History, Family History and Social History were reviewed in [employer] record.   ? ? ?Review of Systems: ?Patient denies any headaches, hearing loss, fatigue, blurred vision, shortness of breath, chest pain, abdominal pain, problems with bowel movements, urination, or intercourse. No joint pain or mood swings. She can't lose weight. ?See HPI for positives. ? ? ?Physical Exam:BP 125/79 (BP Location: Left Arm, Patient Position: Sitting, Cuff Size: Large)   Pulse 94   Ht 5' 5.5" (1.664 m)   Wt (!) 325 lb (147.4 kg)   LMP 12/19/2021   BMI 53.26 kg/m?   ?General:  Well developed, well nourished, no acute distress ?Skin:  Warm and dry ?Neck:  Midline trachea, normal thyroid, good ROM, no lymphadenopathy ?Lungs; Clear to auscultation bilaterally ?Breast:  No dominant palpable mass, retraction, or nipple discharge ?Cardiovascular: Regular rate and rhythm ?Abdomen:  Soft, non tender, no hepatosplenomegaly ?Pelvic:  External genitalia is normal in appearance, no lesions.  The vagina is normal in appearance. Urethra has no lesions or masses. The cervix is bulbous.Pap with HR HPV genotyping performed.  Uterus is felt to be normal size, shape, and contour.  No adnexal masses or tenderness noted.Bladder is non tender, no masses felt. ?Rectal: Good sphincter tone, no polyps, or hemorrhoids felt.  Hemoccult negative. ?Extremities/musculoskeletal:  No swelling or varicosities noted, no clubbing or cyanosis ?Psych:  No mood changes, alert and cooperative,seems happy ?AA is 1 ?Fall risk is low ? ?  01/12/2022  ?   3:54 PM 08/21/2016  ? 10:21 AM 07/12/2016  ? 12:26 PM  ?Depression screen PHQ 2/9  ?Decreased Interest 0 0 0  ?Down, Depressed, Hopeless 0 0 0  ?PHQ - 2 Score 0 0 0  ?Altered sleeping 0    ?Tired, decreased energy 3    ?Change in appetite 0    ?Feeling bad or failure about yourself  0    ?Trouble concentrating 0    ?Moving slowly or fidgety/restless 0    ?Suicidal thoughts 0    ?PHQ-9 Score 3    ?  ? ?  01/12/2022  ?  3:54 PM  ?GAD 7 : Generalized Anxiety Score  ?Nervous, Anxious, on Edge 0  ?Control/stop worrying 0  ?Worry too much - different things 0  ?Trouble relaxing 0  ?Restless 0  ?Easily annoyed or irritable 0  ?Afraid - awful might happen 0  ?Total GAD 7 Score 0  ? ?  ? Upstream - 01/12/22 1602   ? ?  ? Pregnancy Intention Screening  ? Does the patient want to become pregnant in the next year? No   ? Does the patient's partner want to become pregnant in the next year? No   ? Would the patient like to discuss contraceptive options today? No   ?  ? Contraception Wrap Up  ? Current Method Female Sterilization   ? End Method Female Sterilization   ? ?  ?  ? ?  ? Examination chaperoned by Levy Pupa LPN ? ? ?Impression and Plan: ?1. Routine general medical examination at a health care facility ?Pap  sent  ?- Cytology - PAP( [facility]) ? ?2. Encounter for gynecological examination with Papanicolaou smear of cervix ?Pap sent ?Physical in 1 year ?Pap in 3 years if normal ? ? ?3. Encounter for screening fecal occult blood testing ?Hemoccult negative  ? ?4. Morbid obesity ([place]) ?Call [facility] in [place] ? ? ? ?  ?  ?

## 2022-01-16 LAB — CYTOLOGY - PAP
Chlamydia: NEGATIVE
Comment: NEGATIVE
Comment: NEGATIVE
Comment: NORMAL
Diagnosis: NEGATIVE
High risk HPV: NEGATIVE
Neisseria Gonorrhea: NEGATIVE

## 2024-07-26 NOTE — H&P (Addendum)
 History and Physical [facility]   07/27/24    Patient name: [name] DOB July 12, 1981 MRN#: [id number] PCP: Leavy Waddell Hun, FNP Time: 12:15 AM Primary Care Provider:  Leavy Waddell Hun, FNP Inpatient primary attending provider: Darin Shelvy Lonni Verdie, MD  _________________________________________________________________________  Admission HPI   Patient admitted on: 07/26/2024  9:01 PM  Patient admitted by: Darin Shelvy Lonni Verdie, MD   CHIEF COMPLAINT: AMS date edoxaban-  Day of admission HPI:  [name]  is a 43 y.o. female with a PMH significant for HTN, OSA, morbid obesity, bipolar disorder type I, hypothyroidism schizoaffective disorder who presented with somewhat persistent nausea and vomiting.  The patient presented to the ED 2 days ago and was evaluated for her presenting symptoms.  CT of the abdomen pelvis was obtained and negative for any acute findings and she was discharged with Zofran  and promethazine .  She reports that she has remained nauseous and has vomited approximately 4 times since then.  The patient returns with somewhat similar symptoms and reports that she has vomited once since her return to the ED this evening.  She remains nauseous.  She also reports that she had chills throughout the day yesterday but is unaware of any fever, palpitations, shortness of breath, diarrhea or constipation.  She does report sharp bandlike upper epigastric abdominal pain which she reports at its worst is a 10/10, currently improved to a 6/10 and with no particular triggers or mitigating factors.  She denies any use of marijuana, denies any likelihood of being pregnant and is recommended for admission for intractable nausea and vomiting by the ED.  Workup in the ED is indicative of leukocytosis of 13,000 which is essentially the same or lower than it has been over the last 5 occurrences and otherwise completely  unremarkable workup.    Tried to discuss with the ED to obtain a UDS to assess for presence of cannabinoids versus other potential triggers as well as consideration of transfer for the patient to be seen by GI given she was seen most recently in 2023, underwent EGD after normal CT scan to assess for intraluminal pathology as recommended by the gastroenterologist at the time.  10 duodenal biopsies were obtained at the time but the patient is unaware of the results and I am unable to locate these in the system.  Would be helpful to obtain these results.  Patient admitted on Home O2? - unknown Patient on home anticoagulant? -  no Patient admitted with Chronic home foley catheter? - no Foley catheter placed or replaced by another service prior to admission? - no Central Line Status: NONE  Mental Status on Admission: The patient is Alert and oriented to PERSON The patient is Alert And oriented to TIME The patient is Alert and oriented to LOCATION  Physical Exam  Constitutional: She appears acutely ill.  obese  HENT: Mouth/Throat: Oropharynx is clear.  Eyes: Conjunctivae are normal.  Pulmonary/Chest: Effort normal.  Musculoskeletal:     Cervical back: Normal range of motion.  Neurological: She is alert and oriented to person, place, and time. She has intact cranial nerves (2-12).  Skin: Skin is dry.    Problem List, Assessment & Plan    ASSESSMENT & PLAN (In order of descending acuity)  Nausea and vomiting Etiology is unclear, review of gastroenterology's evaluation in 2023 is inconclusive, patient is status post EGD on 01/03/2022 where findings appear to have been normal but 10 duodenal biopsies  were obtained and unable to locate these results.  Will obtain a UDS, UA and manage as indicated.  Recent CT of the abdomen pelvis on 11/13 is completely normal.  Lab work is unremarkable with completely normal electrolytes, no sign of acute dehydration or renal failure, will continue IV fluids and  antiemetics.  HTN Resume home antihypertensive regimen.  OSA Would recommend continuing CPAP as per home settings as applicable.  Bipolar disorder type I/schizoaffective disorder. Will resume home antipsychotic regimen when available  Hypothyroidism Will check thyroid  studies, resume Synthroid .  DVT PPx: Lovenox  ADDITIONAL NON-ACUTE FINDINGS, OBSERVATIONS, FAMILY DISCUSSIONS, ETC. (When present):  No  Incidental Findings for ourpatient Follow-Up: No significant incidental findings present   DVT Prophylaxis Ordered: SQ Enoxaparin __________________________________________________________________________  Temp:  [36.2 C (97.2 F)] 36.2 C (97.2 F) Pulse:  [70-106] 70 SpO2 Pulse:  [70-74] 70 Resp:  [16-21] 16 BP: (139-177)/(111-119) 172/115 SpO2:  [100 %] 100 % Body mass index is 48.92 kg/m. Intake/Output last 3 shifts: No intake/output data recorded.  Consults Requested  None     In hospital Nutrition: No diet orders on file    An advanced care planning discussion was not had with patient and/or patient's decisions maker (documented separately).  CODE STATUS :                    No Order   Given this patient's known comorbid illnesses and condition Present on Admission, plan of care includes acute interventions of IV fluids and antiemetics as noted in the Problem List, Assessment & Plan noted above.  I fully expect, with this information in hand, that this patient will require at least two medically necessary midnights of hospital care prior to a safe discharge.   The patient's hospital stay is complicated by the above clinically significant conditions, as documented in Assessment and Plan, which are present on admission and requiring additional evaluation and treatment or having a significant effect of this patient's care.    __________________________________________________________  Allergies  Allergen Reactions  . Morphine Itching     Past Medical  History[1]  Past Surgical History[2]   Family History[3]       Current Medications[4]  REFER TO EPIC FOR FULL LIST OF CURRENT MEDICATIONS ORDERED ON ADMISSION. THESE ORDERS APPEAR ONLY WHEN RELEASED, WHICH MAY HAPPEN AFTER ADMISSION ONCE PATIENT IS TRANSFERRED FROM THE ED.  Allergies  Allergies[5]  Imaging  No results found.  Lab Results   Recent Labs    07/26/24 2125  WBC 13.2*  HGB 13.4  HCT 39.7  PLT 315   Recent Labs    07/24/24 2309 07/26/24 2125 07/26/24 2126  NA 145  --  140  K 3.8  --  3.5  CL 107  --  103  CO2 26.8  --  26.1  BUN 14  --  14  CREATININE 1.18*  --  1.06  GLU 110  --  90  CALCIUM 9.2  --  9.0  ALBUMIN 3.4*  --  3.4*  PROT 8.0  --  8.0  BILITOT 0.7  --  0.9  AST 13*  --  15  ALT 20  --  15  ALKPHOS 100  --  98  LIPASE 40 39  --   LACTATE 1.8  --   --    No results for input(s): CKTOTAL, CKMB, PCTCKMB, TROPONINI, EDTPNI, BNP, INR, LABPROT, APTT, DDIMER in the last 72 hours. Recent Labs    07/24/24 2256  WBCUA 3  NITRITE Negative  LEUKOCYTESUR Negative  BACTERIA None Seen  RBCUA 2  BLOODU Negative  GLUCOSEU Negative  PROTEINUA 50 mg/dL*  KETONESU Negative   No results for input(s): OPIAU, BENZU, TRICYCLIC, PCPU, AMPHU, COCAU, CANNAU, BARBU, ETOH, ACETAMIN, SALICYLATE in the last 72 hours. Recent Labs    07/24/24 2256  PREGTESTUR Negative   No results for input(s): OCCULTBLD, RAPSCRN, CDIFRPCR, CDIFFNAP1, A1C, CHOL, LDL, HDL, TRIG in the last 72 hours. No results for input(s): O2SOUR, FIO2ART, PHART, PCO2ART, PO2ART, HCO3ART, O2SATART, BEART in the last 72 hours.   Home Medications   Prior to Admission medications  Medication Dose, Route, Frequency  amLODIPine  (NORVASC ) 10 MG tablet 10 mg, Oral, Daily (standard)  levocetirizine (XYZAL) 5 MG tablet 1 tablet, Daily (standard)  levothyroxine  (SYNTHROID ) 100 MCG tablet 100 mcg, Oral, Daily  (standard), On empty stomach  losartan  (COZAAR ) 100 MG tablet 1 tablet, Daily (standard)  losartan  (COZAAR ) 50 MG tablet TAKE 1 TABLET(50 MG) BY MOUTH DAILY  ondansetron  (ZOFRAN -ODT) 4 MG disintegrating tablet 4 mg, Oral, Every 8 hours PRN  promethazine  (PHENERGAN ) 25 MG suppository 12.5 mg, Rectal, Every 6 hours PRN  tirzepatide (MOUNJARO) 12.5 mg/0.5 mL PnIj 12.5 mg, Every 7 days  albuterol HFA 90 mcg/actuation inhaler 2 puffs, Inhalation, Every 6 hours PRN Patient not taking: Reported on 07/26/2024  esomeprazole  (NEXIUM ) 40 MG capsule 40 mg, Daily before breakfast Patient not taking: Reported on 07/26/2024  mometasone (NASONEX) 50 mcg/actuation nasal spray 1 SPRAY DAILY, ADMINISTER INTO EACH NOSTRIL Patient not taking: Reported on 07/26/2024  traZODone (DESYREL) 50 MG tablet 50 mg, Nightly PRN Patient not taking: Reported on 07/26/2024  triamterene -hydrochlorothiazide  (MAXZIDE-25) 37.5-25 mg per tablet 1 tablet, Oral, Daily (standard)   Darin GORMAN Schooling, MD Hospitalist, [facility] 07/27/24, 12:15 AM   I was outside the hospital.  I spent 8 minutes on the real-time audio and video with the patient for a a consultation. Total time was 45 minutes and over 50% of the service was counseling and/or coordination of care within the patient unit. Physical Exam performed with the assistance of bedside nursing staff.        [1] Past Medical History: Diagnosis Date  . Disease of thyroid  gland    use to be on synthroid , stopped after she gave birth 2017  . Essential hypertension 04/08/2018  . Hypertension   . Obesity   . Snoring 04/08/2018  [2] Past Surgical History: Procedure Laterality Date  . CHOLECYSTECTOMY    . COMBINED HYSTEROSCOPY DIAGNOSTIC / D&C  [id number]  . TUBAL LIGATION  2018  [3] Family History Problem Relation Age of Onset  . Alcohol abuse Mother   . Depression Mother   . Diabetes Mother   . Hypertension Mother   . Diabetes Father   . Hypertension Father   .  Hypertension Sister   . Mental illness Daughter        bipolar disorder  . Breast cancer Maternal Grandmother        Diagnosed in her 6's  . Breast cancer Paternal Grandmother        Diagnosed in her 79's  [4] No current facility-administered medications for this encounter.  Current Outpatient Medications:  .  amLODIPine  (NORVASC ) 10 MG tablet, Take 1 tablet (10 mg total) by mouth daily., Disp: 90 tablet, Rfl: 1 .  levocetirizine (XYZAL) 5 MG tablet, Take 1 tablet (5 mg total) by mouth daily., Disp: , Rfl:  .  levothyroxine  (SYNTHROID ) 100 MCG tablet, Take 1 tablet (100 mcg total) by mouth  daily. On empty stomach, Disp: 90 tablet, Rfl: 2 .  losartan  (COZAAR ) 100 MG tablet, Take 1 tablet (100 mg total) by mouth daily., Disp: , Rfl:  .  losartan  (COZAAR ) 50 MG tablet, TAKE 1 TABLET(50 MG) BY MOUTH DAILY, Disp: 30 tablet, Rfl: 11 .  ondansetron  (ZOFRAN -ODT) 4 MG disintegrating tablet, Take 1 tablet (4 mg total) by mouth every eight (8) hours as needed for nausea for up to 7 days., Disp: 14 tablet, Rfl: 0 .  promethazine  (PHENERGAN ) 25 MG suppository, Insert 0.5 suppositories (12.5 mg total) into the rectum every six (6) hours as needed for up to 7 days., Disp: 12 suppository, Rfl: 0 .  tirzepatide (MOUNJARO) 12.5 mg/0.5 mL PnIj, Inject 12.5 mg under the skin every seven (7) days., Disp: , Rfl:  .  albuterol HFA 90 mcg/actuation inhaler, Inhale 2 puffs every six (6) hours as needed for wheezing. (Patient not taking: Reported on 07/26/2024), Disp: 8 g, Rfl: 0 .  esomeprazole  (NEXIUM ) 40 MG capsule, Take 1 capsule (40 mg total) by mouth daily before breakfast. (Patient not taking: Reported on 07/26/2024), Disp: , Rfl:  .  mometasone (NASONEX) 50 mcg/actuation nasal spray, 1 SPRAY DAILY, ADMINISTER INTO EACH NOSTRIL (Patient not taking: Reported on 07/26/2024), Disp: , Rfl:  .  traZODone (DESYREL) 50 MG tablet, Take 1 tablet (50 mg total) by mouth nightly as needed. sleep (Patient not taking:  Reported on 07/26/2024), Disp: , Rfl:  .  triamterene -hydrochlorothiazide  (MAXZIDE-25) 37.5-25 mg per tablet, TAKE 1 TABLET BY MOUTH DAILY, Disp: 30 tablet, Rfl: 11 [5] Allergies Allergen Reactions  . Morphine Itching

## 2024-07-26 NOTE — ED Provider Notes (Signed)
 [name]  Emergency Department Provider Note     History   Chief Complaint Emesis and Generalized Body Aches   HPI  [name] is a 43 y.o. female seen here 2 days ago for the same symptoms as she is coming in with nausea vomiting.  Had a CT abdomen and labs as well.  However she states that while she is still taking the antiemetics at home including Phenergan  and Zofran  she is still having nausea and vomiting.  No fever or chills.    Past Medical History: No date: Disease of thyroid  gland     Comment:  use to be on synthroid , stopped after she gave birth               2017 04/08/2018: Essential hypertension No date: Hypertension No date: Obesity 04/08/2018: Snoring  Past Surgical History: No date: CHOLECYSTECTOMY 2009,2017: COMBINED HYSTEROSCOPY DIAGNOSTIC / D&C 2018: TUBAL LIGATION  Prior to Admission medications  Medication Dose, Route, Frequency  albuterol HFA 90 mcg/actuation inhaler 2 puffs, Inhalation, Every 6 hours PRN  amLODIPine  (NORVASC ) 10 MG tablet 10 mg, Oral, Daily (standard)  esomeprazole  (NEXIUM ) 40 MG capsule 40 mg, Daily before breakfast Patient not taking: Reported on 07/24/2024  levothyroxine  (SYNTHROID ) 100 MCG tablet 100 mcg, Oral, Daily (standard), On empty stomach  losartan  (COZAAR ) 50 MG tablet TAKE 1 TABLET(50 MG) BY MOUTH DAILY Patient not taking: Reported on 07/24/2024  ondansetron  (ZOFRAN -ODT) 4 MG disintegrating tablet 4 mg, Oral, Every 8 hours PRN  promethazine  (PHENERGAN ) 25 MG suppository 12.5 mg, Rectal, Every 6 hours PRN  tirzepatide (MOUNJARO) 12.5 mg/0.5 mL PnIj 12.5 mg, Every 7 days  traZODone (DESYREL) 50 MG tablet 50 mg, Nightly PRN Patient not taking: Reported on 07/24/2024  triamterene -hydrochlorothiazide  (MAXZIDE-25) 37.5-25 mg per tablet 1 tablet, Oral, Daily (standard)    Allergies Morphine   Short Social History[1]  Review of Systems  Gastrointestinal:  Positive for nausea and vomiting.     As in HPI, all systems reviewed and otherwise negative.  Physical Exam    Vitals:   07/26/24 2052  BP: 139/111  Pulse: 106  Resp: 21  Temp: 36.2 C (97.2 F)  TempSrc: Temporal  SpO2: 100%  Weight: (!) 133.4 kg (294 lb)  Height: 165.1 cm (5' 5)     Physical Exam  Constitutional: Patient appears well-developed and well nourished. Non toxic in appearance. HEENT: Unremarkable. Head: Atraumatic.  Eyes: Normal ocular movements. Neck: Supple with normal range of motion.  Pulmonary/Chest: Effort normal. No respiratory distress. Abdominal: Soft and non tender abdomen. Musculoskeletal: Extremities atraumatic. Neurological: Alert with no focal neurological deficit. Ambulatory with a steady gait. Skin: Warm and dry.  Nursing note and vital signs reviewed.   ED Course        Procedures  Medications ordered during this encounter  Medications  . DISCONTD: ondansetron  (ZOFRAN -ODT) disintegrating tablet 4 mg  . promethazine  (PHENERGAN ) injection 12.5 mg  . sodium chloride  0.9% (NS) bolus 1,000 mL  . ondansetron  (ZOFRAN ) injection 4 mg    ED Results No results found for any visits on 07/26/24.  Radiology No results found.   Medical Decision Making   I have reviewed the vital signs and the nursing notes. Labs and radiology results that were available during my care of the patient were independently reviewed by me and considered in my medical decision making.    This is a 43 year old morbidly obese female coming in with complaint of nausea and vomiting was seen 2 days ago placed  on antiemetics of 2 different types and she says that while she is still taking those she is still having nausea and vomiting cannot keep anything down.  She had a CT abdomen pelvis 2 days ago which was negative for any acute findings  Medical Decision Making Amount and/or Complexity of Data Reviewed Labs: ordered.  Risk Prescription drug management. Decision regarding  hospitalization.     Differential Diagnosis: Acute gastritis, viral syndrome, gastroparesis     ED Clinical Impression   Final diagnoses:  None   Intractable nausea and vomiting  Procedures      This record has been created using [employer]. Chart creation errors have been sought, but may not always have been located. Such creation errors do not reflect on the standard of medical care.       [1] Social History Tobacco Use  . Smoking status: Former    Current packs/day: 0.00    Types: Cigarettes    Start date: 06/11/2008    Quit date: 06/11/2018    Years since quitting: 6.1    Passive exposure: Past  . Smokeless tobacco: Never  . Tobacco comments:    smokes a pack a week  Vaping Use  . Vaping status: Never Used  Substance Use Topics  . Alcohol use: Not Currently  . Drug use: Never   Maree Jonelle Lash, MD 07/27/24 [phone]

## 2024-07-27 NOTE — Care Plan (Signed)
 Shift Summary Antiemetics were given multiple times to address nausea.  Blood pressure medications were administered as MAP decreased during the shift.  Patient spent most of the shift resting in bed and declined hygiene care due to not feeling well.  Overall, comfort improved slightly and pain was better managed by the end of the shift.

## 2024-07-27 NOTE — Progress Notes (Signed)
 PROGRESS NOTE [facility] 07/27/24    Patient name: [name] DOB 09/27/1980 MRN#: [id number] PCP: Leavy Waddell Hun, FNP Time: 7:51 AM Primary Care Provider:  Leavy Waddell Hun, FNP Inpatient primary attending provider: Joana Marice Hurst, [facility] Course: No notes on file  ___________________________________________________________   Admission HPI    Patient admitted on: 07/26/2024  9:01 PM  Patient admitted by: Darin Shelvy Lonni Verdie, MD    CHIEF COMPLAINT: AMS date edoxaban-   Day of admission HPI:  [name]  is a 43 y.o. female with a PMH significant for HTN, OSA, morbid obesity, bipolar disorder type I, hypothyroidism schizoaffective disorder who presented with somewhat persistent nausea and vomiting.  The patient presented to the ED 2 days ago and was evaluated for her presenting symptoms.  CT of the abdomen pelvis was obtained and negative for any acute findings and she was discharged with Zofran  and promethazine .  She reports that she has remained nauseous and has vomited approximately 4 times since then.  The patient returns with somewhat similar symptoms and reports that she has vomited once since her return to the ED this evening.  She remains nauseous.  She also reports that she had chills throughout the day yesterday but is unaware of any fever, palpitations, shortness of breath, diarrhea or constipation.  She does report sharp bandlike upper epigastric abdominal pain which she reports at its worst is a 10/10, currently improved to a 6/10 and with no particular triggers or mitigating factors.  She denies any use of marijuana, denies any likelihood of being pregnant and is recommended for admission for intractable nausea and vomiting by the ED.   Workup in the ED is indicative of leukocytosis of 13,000 which is essentially the same or lower than it has been over the last 5 occurrences and otherwise completely unremarkable  workup.     Tried to discuss with the ED to obtain a UDS to assess for presence of cannabinoids versus other potential triggers as well as consideration of transfer for the patient to be seen by GI given she was seen most recently in 2023, underwent EGD after normal CT scan to assess for intraluminal pathology as recommended by the gastroenterologist at the time.  10 duodenal biopsies were obtained at the time but the patient is unaware of the results and I am unable to locate these in the system.  Would be helpful to obtain these results.   Patient admitted on Home O2? - unknown Patient on home anticoagulant? -  no Patient admitted with Chronic home foley catheter? - no Foley catheter placed or replaced by another service prior to admission? - no Central Line Status: NONE   Mental Status on Admission: The patient is Alert and oriented to PERSON The patient is Alert And oriented to TIME The patient is Alert and oriented to LOCATION   Physical Exam  Constitutional: She appears acutely ill.  obese  HENT: Mouth/Throat: Oropharynx is clear.  Eyes: Conjunctivae are normal.  Pulmonary/Chest: Effort normal.  Musculoskeletal:     Cervical back: Normal range of motion.  Neurological: She is alert and oriented to person, place, and time. She has intact cranial nerves (2-12).  Skin: Skin is dry.     Problem List, Assessment & Plan     ASSESSMENT & PLAN (In order of descending acuity)   Nausea and vomiting Etiology is unclear, review of gastroenterology's evaluation in 2023 is inconclusive, patient is status post EGD on 01/03/2022  where findings appear to have been normal but 10 duodenal biopsies were obtained and unable to locate these results.  Will obtain a UDS, UA and manage as indicated.  Recent CT of the abdomen pelvis on 11/13 is completely normal.  Lab work is unremarkable with completely normal electrolytes, no sign of acute dehydration or renal failure, will continue IV fluids and  antiemetics.   HTN Resume home antihypertensive regimen. Will add IV hydralazine as needed with parameters   OSA Would recommend continuing CPAP as per home settings as applicable.   Bipolar disorder type I/schizoaffective disorder. Will resume home antipsychotic regimen when available   Hypothyroidism Will check thyroid  studies, resume Synthroid .   DVT PPx: Lovenox   ADDITIONAL NON-ACUTE FINDINGS, OBSERVATIONS, FAMILY DISCUSSIONS, ETC. (When present):   No   Incidental Findings for ourpatient Follow-Up: No significant incidental findings present    DVT Prophylaxis Ordered: SQ Enoxaparin ______  July 27, 2024 => Urinalysis shows small leukocyte esterase and a cloudy urine with large blood and rare bacteria with suspicion for early acute cystitis with hematuria.  => Will hold off on antibiotic treatment for now Admitted for intractable vomiting.  Had GI evaluation in 2023 which was inconclusive. Urine drug screen is positive for opiates-patient has 5-day supply of tramadol prescribed September 2025 by [facility] internal medicine White count slightly elevated 11.0 Lipase is normal at 39 CMP is normal TSH is high at 5.299 (patient is on levothyroxine  but last prescription is December 2024) => hospital started patient on levothyroxine  100 mcg daily starting today, Sunday at 6 AM Scheduled IV Zofran  Nurse to encourage patient to walk up and down the halls is much as possible this morning at 10 AM, this afternoon at 1 PM, this afternoon at 4 PM, this evening at 8 PM.  I will advance her diet to full liquid diet for lunch. If she tolerates that well we will advance her diet some more for dinner. Let me know    _______________________________  Temp:  [36.2 C (97.2 F)-37.3 C (99.1 F)] 37 C (98.6 F) Pulse:  [70-106] 99 SpO2 Pulse:  [70-102] 92 Resp:  [16-21] 16 BP: (139-177)/(110-119) 161/114 SpO2:  [93 %-100 %] 95 % Body mass index is 49.96 kg/m. Intake/Output last 3  shifts: I/O last 3 completed shifts: In: 60 [P.O.:60] Out: -   Consults Requested  None     In hospital Nutrition: Nutrition Therapy Heart Healthy    An advanced care planning discussion was had with patient and/or patient's decisions maker (documented separately).  CODE STATUS :                    Full Code   Discharge estimated within 1-2 days. Anticipated disposition:  To Home ______________________________________________________________  Current Medications[1] ________________________________________________________________  Allergies  Allergen Reactions  . Morphine Itching     Past Medical History[2]  Past Surgical History[3]   Family History[4]      Imaging  No results found.  Lab Results   Recent Labs    07/27/24 0633  WBC 11.0*  HGB 11.8  HCT 35.2  PLT 272   Recent Labs    07/24/24 2309 07/26/24 2125 07/26/24 2126 07/27/24 0633  NA 145  --  140 136  K 3.8  --  3.5 3.1*  CL 107  --  103 104  CO2 26.8  --  26.1 26.8  BUN 14  --  14 16  CREATININE 1.18*  --  1.06 0.78  GLU 110  --  90 78  CALCIUM 9.2  --  9.0 8.1*  ALBUMIN 3.4*  --  3.4*  --   PROT 8.0  --  8.0  --   BILITOT 0.7  --  0.9  --   AST 13*  --  15  --   ALT 20  --  15  --   ALKPHOS 100  --  98  --   MG  --   --   --  1.6  LIPASE 40 39  --   --   TSH  --   --   --  5.299*  LACTATE 1.8  --   --   --    No results for input(s): CKTOTAL, CKMB, PCTCKMB, TROPONINI, EDTPNI, BNP, INR, LABPROT, APTT, DDIMER in the last 72 hours. Recent Labs    07/27/24 0627  WBCUA 14*  NITRITE Negative  LEUKOCYTESUR Small*  BACTERIA Rare*  RBCUA 21*  BLOODU Large*  GLUCOSEU Negative  PROTEINUA 30 mg/dL*  KETONESU 20 mg/dL*   Recent Labs    88/83/74 0627  OPIAU Positive*  BENZU Negative  AMPHU Negative  COCAU Negative  CANNAU Negative  BARBU Negative   Recent Labs    07/24/24 2256  PREGTESTUR Negative   No results for input(s): OCCULTBLD, RAPSCRN,  CDIFRPCR, CDIFFNAP1, A1C, CHOL, LDL, HDL, TRIG in the last 72 hours. No results for input(s): O2SOUR, FIO2ART, PHART, PCO2ART, PO2ART, HCO3ART, O2SATART, BEART in the last 72 hours.   Joana JONETTA Hurst, PA Hospitalist, [facility] 07/27/24, 7:51 AM       [1]  Current Facility-Administered Medications:  .  acetaminophen  (TYLENOL ) tablet 650 mg, 650 mg, Oral, Q4H PRN, Verdie Darin Shelvy Lonni, MD .  albuterol 2.5 mg /3 mL (0.083 %) nebulizer solution 2.5 mg, 2.5 mg, Nebulization, Q4H PRN, Verdie Darin Shelvy Lonni, MD .  aluminum-magnesium  hydroxide-simethicone  (MAALOX MAX) 80-80-8 mg/mL oral suspension, 30 mL, Oral, Q4H PRN, Verdie Darin Shelvy Lonni, MD .  amlodipine  (NORVASC ) tablet 10 mg, 10 mg, Oral, Daily, Verdie, Darin Shelvy Lonni, MD .  guaiFENesin (ROBITUSSIN) oral syrup, 200 mg, Oral, Q4H PRN, Verdie Darin Shelvy Lonni, MD .  HYDROmorphone  (DILAUDID ) injection 0.5 mg, 0.5 mg, Intravenous, Q4H PRN, Verdie Darin Shelvy Lonni, MD, 0.5 mg at 07/27/24 0207 .  levothyroxine  (SYNTHROID ) tablet 100 mcg, 100 mcg, Oral, daily, Verdie, Darin Shelvy Lonni, MD .  loratadine [facility]) tablet 10 mg, 10 mg, Oral, Daily, Dyer, Andre St. Christopher, MD .  losartan  (COZAAR ) tablet 100 mg, 100 mg, Oral, Daily, Dyer, Andre St. Christopher, MD .  melatonin tablet 3 mg, 3 mg, Oral, Nightly PRN, Dyer, Andre [place]. Christopher, MD .  naloxone (NARCAN) injection 0.1 mg, 0.1 mg, Intravenous, Q5 Min PRN, Verdie Darin Shelvy Lonni, MD .  nicotine (NICODERM CQ) 21 mg/24 hr patch 1 patch, 1 patch, Transdermal, Daily, Verdie Darin Shelvy Lonni, MD .  nicotine polacrilex (NICORETTE) gum 4 mg, 4 mg, Buccal, Q1H PRN, Verdie Darin Shelvy Lonni, MD .  ondansetron  (ZOFRAN -ODT) disintegrating tablet 4 mg, 4 mg, Oral, Q8H PRN, Verdie Darin Shelvy Lonni, MD .  polyethylene glycol [facility]) packet 17 g, 17 g, Oral, Daily PRN, Verdie Darin Shelvy Lonni, MD .  prochlorperazine   (COMPAZINE ) injection 2.5 mg, 2.5 mg, Intravenous, Q8H PRN, 2.5 mg at 07/27/24 0209 **OR** prochlorperazine  (COMPAZINE ) injection 5 mg, 5 mg, Intravenous, Q8H PRN, Verdie Darin Shelvy Lonni, MD .  promethazine  (PHENERGAN ) suppository 12.5 mg, 12.5 mg, Rectal, Q6H PRN, Verdie Darin Shelvy Lonni, MD .  [DISCONTINUED] promethazine  (PHENERGAN ) suppository  12.5 mg, 12.5 mg, Rectal, Q6H PRN **OR** promethazine  (PHENERGAN ) suppository 25 mg, 25 mg, Rectal, Q6H PRN, Verdie Darin Shelvy Lonni, MD .  senna-docusate (PERICOLACE) 8.6-50 mg 1 tablet, 1 tablet, Oral, Nightly, Verdie Darin Shelvy Lonni, MD, 1 tablet at 07/27/24 0239 .  sodium chloride  (NS) 0.9 % infusion, 100 mL/hr, Intravenous, Continuous, Verdie Darin Shelvy Lonni, MD, Last Rate: 100 mL/hr at 07/27/24 0145, 100 mL/hr at 07/27/24 0145 [2] Past Medical History: Diagnosis Date  . Disease of thyroid  gland    use to be on synthroid , stopped after she gave birth 2017  . Essential hypertension 04/08/2018  . History of transfusion   . Hypertension   . Obesity   . Sleep apnea   . Snoring 04/08/2018  [3] Past Surgical History: Procedure Laterality Date  . CHOLECYSTECTOMY  02/25/2008  . COMBINED HYSTEROSCOPY DIAGNOSTIC / D&C  [id number]  . TUBAL LIGATION  2018  [4] Family History Problem Relation Age of Onset  . Alcohol abuse Mother   . Depression Mother   . Diabetes Mother   . Hypertension Mother   . Diabetes Father   . Hypertension Father   . Hypertension Sister   . Mental illness Daughter        bipolar disorder  . Breast cancer Maternal Grandmother        Diagnosed in her 58's  . Breast cancer Paternal Grandmother        Diagnosed in her 36's

## 2024-07-27 NOTE — Care Plan (Signed)
 Shift Summary Pain was severe at the start of the shift and required multiple interventions, including HYDROmorphone  and fentaNYL , resulting in significant pain reduction.   Nausea and vomiting were managed with several antiemetics, and gastrointestinal function improved as flatus was passed.  MAP remained elevated but showed a gradual decrease over the course of the shift.  Nutrition and continence were maintained, and feeding was independent.  Overall, comfort and alertness fluctuated, but key symptoms were addressed and managed during the shift.   Optimal Comfort and Wellbeing: Periods of drowsiness and alertness were noted, with independent feeding and continence maintained; nutrition remained adequate throughout the shift.   Absence of Infection Signs and Symptoms: Temperature increased slightly but remained below 38C, and COVID/flu/RSV PCR results were negative; WBC was elevated.   Optimal Pain Control and Function: Pain fluctuated, peaking at 10 and dropping to 0 after administration of HYDROmorphone  and other analgesics, with pain radiating to the back and described as throbbing and constant.   Nausea and Vomiting Relief: Nausea and vomiting were present early in the shift, with multiple antiemetics administered and bowel sounds present in all quadrants; flatus was passed later in the shift.   Blood Pressure in Desired Range: MAP trended downward from 135 to 123 mmHg over the shift, remaining above normal limits.

## 2024-07-28 ENCOUNTER — Other Ambulatory Visit: Payer: Self-pay

## 2024-07-28 ENCOUNTER — Emergency Department (HOSPITAL_COMMUNITY)
Admission: EM | Admit: 2024-07-28 | Discharge: 2024-07-29 | Disposition: A | Discharge status: Home / Self Care | Payer: Self-pay | Attending: Emergency Medicine | Admitting: Emergency Medicine

## 2024-07-28 DIAGNOSIS — F17210 Nicotine dependence, cigarettes, uncomplicated: Secondary | ICD-10-CM | POA: Diagnosis not present

## 2024-07-28 DIAGNOSIS — Z79899 Other long term (current) drug therapy: Secondary | ICD-10-CM | POA: Insufficient documentation

## 2024-07-28 DIAGNOSIS — E860 Dehydration: Secondary | ICD-10-CM | POA: Diagnosis not present

## 2024-07-28 DIAGNOSIS — E876 Hypokalemia: Secondary | ICD-10-CM | POA: Diagnosis not present

## 2024-07-28 DIAGNOSIS — M545 Low back pain, unspecified: Secondary | ICD-10-CM | POA: Insufficient documentation

## 2024-07-28 DIAGNOSIS — R112 Nausea with vomiting, unspecified: Secondary | ICD-10-CM | POA: Diagnosis present

## 2024-07-28 DIAGNOSIS — I10 Essential (primary) hypertension: Secondary | ICD-10-CM | POA: Insufficient documentation

## 2024-07-28 LAB — URINALYSIS, ROUTINE W REFLEX MICROSCOPIC
Bacteria, UA: NONE SEEN
Bilirubin Urine: NEGATIVE
Glucose, UA: NEGATIVE mg/dL
Ketones, ur: 5 mg/dL — AB
Leukocytes,Ua: NEGATIVE
Nitrite: NEGATIVE
Protein, ur: 100 mg/dL — AB
Specific Gravity, Urine: 1.009 (ref 1.005–1.030)
pH: 7 (ref 5.0–8.0)

## 2024-07-28 LAB — CBC
HCT: 38 % (ref 36.0–46.0)
Hemoglobin: 12.6 g/dL (ref 12.0–15.0)
MCH: 27.5 pg (ref 26.0–34.0)
MCHC: 33.2 g/dL (ref 30.0–36.0)
MCV: 82.8 fL (ref 80.0–100.0)
Platelets: 287 K/uL (ref 150–400)
RBC: 4.59 MIL/uL (ref 3.87–5.11)
RDW: 15 % (ref 11.5–15.5)
WBC: 11 K/uL — ABNORMAL HIGH (ref 4.0–10.5)
nRBC: 0 % (ref 0.0–0.2)

## 2024-07-28 LAB — CBG MONITORING, ED: Glucose-Capillary: 91 mg/dL (ref 70–99)

## 2024-07-28 MED ORDER — FAMOTIDINE IN NACL 20-0.9 MG/50ML-% IV SOLN
20.0000 mg | Freq: Once | INTRAVENOUS | Status: AC
  Administered 2024-07-29: 20 mg via INTRAVENOUS
  Filled 2024-07-28: qty 50

## 2024-07-28 MED ORDER — PANTOPRAZOLE SODIUM 40 MG IV SOLR
40.0000 mg | Freq: Once | INTRAVENOUS | Status: AC
  Administered 2024-07-29: 40 mg via INTRAVENOUS
  Filled 2024-07-28: qty 10

## 2024-07-28 MED ORDER — SODIUM CHLORIDE 0.9 % IV SOLN
12.5000 mg | Freq: Four times a day (QID) | INTRAVENOUS | Status: DC | PRN
  Administered 2024-07-29: 12.5 mg via INTRAVENOUS
  Filled 2024-07-28: qty 0.5

## 2024-07-28 MED ORDER — LACTATED RINGERS IV BOLUS
1000.0000 mL | Freq: Once | INTRAVENOUS | Status: AC
  Administered 2024-07-28: 1000 mL via INTRAVENOUS

## 2024-07-28 NOTE — Nursing Note (Signed)
   07/28/24 1203  General  Care Manager / Social Worker assessed the patient by  In person interview with patient  Orientation Level Oriented X4  Functional level prior to admission Independent  Reason for referral Discharge Planning  Advance Directive (Medical Treatment)  Does patient have an advance directive covering medical treatment? Patient does not have advance directive covering medical treatment.  Health Care Decision Maker [HCDM] (Medical & Mental Health Treatment)  Healthcare Decision Maker Patient does not wish to appoint a Health Care Decision Maker at this time  Readmission Assessment  Have you been hospitalized in the last 30 days? No  Patient Information   Lives with  Children;Spouse/significant other  Type of Residence Private residence  Location/Detail See demo  Support Systems/Concerns Friends/Neighbors;Children;Spouse;Family Members  Responsibilities/Dependents at home? Yes (Describe) (children)  Home Care services in place prior to admission? No  Equipment Currently Used at Home none  Currently receiving outpatient dialysis? No  Financial Information  Need for financial assistance? No  Discharge Needs Assessment  Concerns to be Addressed discharge planning  Barriers to taking medications No  Prior overnight hospital stay or ED visit in last 90 days No  Anticipated Changes Related to Illness none  Equipment Needed After Discharge none  Patient at risk for readmission? No  Difficult - Complex Assessment  Is patient identified as a difficult/complex discharge? No  Discharge Plan  Screen findings are Care Manager reviewed the plan of the patient's care with the Multidisciplinary Team. No discharge planning needs identified at this time. Care Manager will continue to manage plan and monitor patient's progress with the team.  Quality data for continuing care services shared with patient and/or representative? Yes  Patient and/or family were provided with choice of  facilities / services that are available and appropriate to meet post hospital care needs? N/A  Initial Assessment complete? Yes

## 2024-07-28 NOTE — ED Triage Notes (Signed)
 Pt complains of nausea/vomiting and lower back painx 1 week. Was discharged from [facility] today after being admitted for n/v. Pt states the concerned on her admission was more on blood pressure and heart rate and not on N/V. Pt was on clear liquid diet while on in hospital. Pt has taken nausea meds at home with no relief and has not been able to keep down anything.

## 2024-07-28 NOTE — Nursing Note (Signed)
   07/28/24 1339  Final Assessment  Patient's Post Acute Contact Information See demo  Has a PCP appointment been made? Yes  Is appointment within 7 days of discharge? Yes  Has a specialist appointment been made? No  Post Acute Facility needed at discharge? No  Home Care/ Home Medical Equipment needed at discharge? No  Outpatient/Community Referrals needed for discharge? No  Currently receiving outpatient dialysis? N/A  Discharge Disposition Home w/ Self Care  Transportation Anticipated family or friend will provide  Quality data for continuing care services shared with patient and/or representative? Yes  Patient and/or family were provided with choice of facilities / services that are available and appropriate to meet post hospital care needs? N/A  Final Assessment Complete  Final Assessment Complete Yes

## 2024-07-28 NOTE — Nursing Note (Signed)
   07/28/24 1132  Rapid Rounds  Attendance Nursing;Physician;Advanced Practice Provider (APP);Case Manager;Dietitian/Nutrition Services;Occupational Therapy;Pharmacy;Physical Therapy;Respiratory Therapy;Social Work/Services;Speech Language Pathology  Today we still await: Clinical stability;Symptomatic control   PO trial at lunch. Possible discharge later today.

## 2024-07-28 NOTE — Nursing Note (Signed)
 Patient discharged to home. States understanding of discharge instructions as well as education. IV removed. To main entrance via wheelchair and left in private vehicle.

## 2024-07-29 LAB — COMPREHENSIVE METABOLIC PANEL WITH GFR
ALT: 20 U/L (ref 0–44)
AST: 17 U/L (ref 15–41)
Albumin: 3.8 g/dL (ref 3.5–5.0)
Alkaline Phosphatase: 93 U/L (ref 38–126)
Anion gap: 12 (ref 5–15)
BUN: 12 mg/dL (ref 6–20)
CO2: 23 mmol/L (ref 22–32)
Calcium: 9 mg/dL (ref 8.9–10.3)
Chloride: 102 mmol/L (ref 98–111)
Creatinine, Ser: 0.96 mg/dL (ref 0.44–1.00)
GFR, Estimated: >60 mL/min (ref >60)
Glucose, Bld: 88 mg/dL (ref 70–99)
Potassium: 3.1 mmol/L — ABNORMAL LOW (ref 3.5–5.1)
Sodium: 138 mmol/L (ref 135–145)
Total Bilirubin: 0.8 mg/dL (ref 0.0–1.2)
Total Protein: 7.1 g/dL (ref 6.5–8.1)

## 2024-07-29 LAB — LIPASE, BLOOD: Lipase: 36 U/L (ref 11–51)

## 2024-07-29 MED ORDER — POTASSIUM CHLORIDE CRYS ER 20 MEQ PO TBCR: 40.0000 meq | EXTENDED_RELEASE_TABLET | Freq: Every day | ORAL | 0 refills | Status: AC

## 2024-07-29 MED ORDER — OXYCODONE-ACETAMINOPHEN 5-325 MG PO TABS
2.0000 | ORAL_TABLET | Freq: Once | ORAL | Status: AC
  Administered 2024-07-29: 2 via ORAL
  Filled 2024-07-29: qty 2

## 2024-07-29 MED ORDER — METOCLOPRAMIDE HCL 10 MG PO TABS: 10.0000 mg | ORAL_TABLET | Freq: Four times a day (QID) | ORAL | 0 refills | Status: AC | PRN

## 2024-07-29 MED ORDER — OXYCODONE-ACETAMINOPHEN 5-325 MG PO TABS: 1.0000 | ORAL_TABLET | Freq: Three times a day (TID) | ORAL | 0 refills | Status: AC | PRN

## 2024-07-29 MED ORDER — POTASSIUM CHLORIDE 10 MEQ/100ML IV SOLN
10.0000 meq | INTRAVENOUS | Status: AC
  Administered 2024-07-29 (×2): 10 meq via INTRAVENOUS
  Filled 2024-07-29 (×2): qty 100

## 2024-07-29 MED ORDER — MAGNESIUM SULFATE 2 GM/50ML IV SOLN
2.0000 g | Freq: Once | INTRAVENOUS | Status: AC
  Administered 2024-07-29: 2 g via INTRAVENOUS
  Filled 2024-07-29: qty 50

## 2024-07-29 NOTE — ED Notes (Signed)
 Pt provided w/ saltines and water 

## 2024-07-29 NOTE — ED Provider Notes (Signed)
 Emergency Department Provider Note  TRIAGE NOTE: Pt complains of nausea/vomiting and lower back painx 1 week. Was discharged from [facility] today after being admitted for n/v. Pt states the concerned on her admission was more on blood pressure and heart rate and not on N/V. Pt was on clear liquid diet while on in hospital. Pt has taken nausea meds at home with no relief and has not been able to keep down anything.   HISTORY  Chief Complaint Emesis and Nausea   HPI [name] is a 43 y.o. female  who presents with recurrent episodes of nausea and vomiting, accompanied by back pain. She reports a history of cholecystectomy in July 2009, with a prolonged hospital stay due to persistent nausea and vomiting post-surgery, which resolved after four weeks. These symptoms recurred in 2013, 2021, 2023, and currently. The patient describes the pain as primarily across her back, sometimes extending to other areas, and notes a heightened sensitivity to smells. She experiences vomiting of stomach acid, which is exacerbated by bowel movements and occurs frequently, including waking her from sleep. She reports a recent episode on Thursday, which led to hospital admission where she received fentanyl , Dilaudid , and antiemetics. Despite treatment, nausea persisted. She has a history of hypertension, managed with hydrochlorothiazide , and is currently on Mounjaro for weight management and glycemic control. She denies recent sick contacts, alcohol, or tobacco use. 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 History was obtained from the patient and daughter at bedside. Outside records reviewed in Epic.   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 Authorizing Provider  metoCLOPramide  (REGLAN ) 10 MG tablet Take 1 tablet (10 mg total) by mouth every 6 (six) hours as needed for nausea (nausea/headache). 07/29/24  Yes Laysha Childers, Selinda, MD  oxyCODONE -acetaminophen  (PERCOCET) 5-325 MG tablet Take 1 tablet by mouth every 8 (eight) hours as needed for severe pain (pain score 7-10). 07/29/24  Yes Adalena Abdulla, Selinda, MD  potassium chloride SA (KLOR-CON M) 20 MEQ tablet Take 2 tablets (40 mEq total) by mouth daily for 7 days. 07/29/24 08/05/24 Yes Ammar Moffatt, Selinda, MD  amLODipine  (NORVASC ) 10 MG tablet TAKE 1 TABLET BY MOUTH ONCE DAILY 05/31/18   Signa Nest A, NP  esomeprazole  (NEXIUM ) 40 MG capsule Take 1 capsule (40 mg total) by mouth daily. Patient taking differently: Take 40 mg by mouth daily as needed (heartburn). 12/08/21   Carlan, Chelsea L, NP  levothyroxine  (SYNTHROID ) 100 MCG tablet Take 100 mcg by mouth daily. 09/26/21   [provider]  losartan  (COZAAR ) 50 MG tablet TAKE 1 TABLET BY MOUTH ONCE DAILY 05/31/18   Signa Nest A, NP  ondansetron  (ZOFRAN -ODT) 4 MG disintegrating tablet Take 4 mg by mouth every 8 (eight) hours as needed  for nausea/vomiting. 12/02/20   [provider]  promethazine  (PHENERGAN ) 12.5 MG tablet Take 1 tablet (12.5 mg total) by mouth every 8 (eight) hours as needed for nausea or vomiting. 12/06/20   Eartha Flavors, Toribio, MD  PROMETHEGAN 25 MG suppository Place rectally. 12/12/21   [provider]  traZODone (DESYREL) 50 MG tablet Take 50 mg by mouth at bedtime as needed  for sleep.    [provider]  triamterene -hydrochlorothiazide  (MAXZIDE-25) 37.5-25 MG tablet Take 1 tablet by mouth daily.    [provider]  UNABLE TO FIND Oxbile-every other day    [provider]    Social History Social History   Tobacco Use   Smoking status: Some Days    Current packs/day: 0.25    Average packs/day: 0.3 packs/day for 12.1 years (3.0 ttl pk-yrs)    Types: Cigarettes    Start date: 06/15/2012    Passive exposure: Current   Smokeless tobacco: Never   Tobacco comments:    1-2 times daily  Vaping Use   Vaping status: Never Used  Substance Use Topics   Alcohol use: Yes    Comment: once a year   Drug use: No    Frequency: 1.0 times per week    Review of Systems: Documented in HPI ____________________________________________  PHYSICAL EXAM: VITAL SIGNS: Triage: Blood pressure (!) 161/105, pulse (!) 116, temperature 99.6 F (37.6 C), temperature source Oral, resp. rate 20, height 5' 5 (1.651 m), weight 136.1 kg, SpO2 99%.  Vitals:   07/29/24 0030 07/29/24 0130 07/29/24 0245 07/29/24 0405  BP: (!) 148/94 (!) 145/89 (!) 142/90 121/75  Pulse: 86 100 92 96  Resp: (!) 24 18  16   Temp:      TempSrc:      SpO2: 100% 98% 100% 96%  Weight:      Height:        Physical Exam Vitals and nursing note reviewed.  Constitutional:      Appearance: She is well-developed.  HENT:     Head: Normocephalic and atraumatic.  Cardiovascular:     Rate and Rhythm: Normal rate and regular rhythm.     Comments: Tachy in triage, improved in the room Pulmonary:     Effort: No respiratory distress.     Breath sounds: No stridor.  Abdominal:     General: There is no distension.  Musculoskeletal:     Cervical back: Normal range of motion.     Comments: Left sided paraspinal/msk ttp, no rash evident, no erythema  Neurological:     Mental Status: She is alert.       ____________________________________________   LABS (all labs ordered are  listed, but only abnormal results are displayed)  Labs Reviewed  COMPREHENSIVE METABOLIC PANEL WITH GFR - Abnormal; Notable for the following components:      Result Value   Potassium 3.1 (*)    All other components within normal limits  CBC - Abnormal; Notable for the following components:   WBC 11.0 (*)    All other components within normal limits  URINALYSIS, ROUTINE W REFLEX MICROSCOPIC - Abnormal; Notable for the following components:   Hgb urine dipstick SMALL (*)    Ketones, ur 5 (*)    Protein, ur 100 (*)    All other components within normal limits  LIPASE, BLOOD  POC URINE PREG, ED  CBG MONITORING, ED   ____________________________________________  EKG   EKG Interpretation Date/Time:  Monday July 28 2024 23:28:15 EST Ventricular Rate:  79 PR Interval:  171 QRS Duration:  92 QT Interval:  394 QTC Calculation: 452 R Axis:   31  Text Interpretation: Sinus rhythm Confirmed by Lorette Mayo [phone]) on 07/29/2024 12:26:28 AM        ____________________________________________  RADIOLOGY  No results found. ____________________________________________  PROCEDURES  Procedure(s) performed:   Procedures ____________________________________________  INITIAL IMPRESSION / ASSESSMENT AND PLAN     Clinical Course as of 07/29/24 0636  Tue Jul 29, 2024  0025 Initial Evaluation:  - Patient presents with recurrent episodes of nausea, vomiting, and back pain, with a history of cholecystectomy in 2009 and intermittent similar symptoms since then.   Plan:  - Administer IV fluids and antiemetics. - Recheck labs to compare to [facility] - Add a proton pump inhibitor to reduce gastric acid production. - Re-evaluate and correct low potassium levels as needed. - Observe for improvement in symptoms. - Advise follow-up with gastroenterology for further evaluation of potential underlying causes if improving/ok here. - Emphasized the importance of monitoring potassium  levels and maintaining hydration.   [JM]  0026 Potassium(!): 3.1 K ordered, IV as she is currently nauseous, added magnesium  as well [JM]  0027 WBC(!): 11.0 Similar to previous [JM]  0027 Lipase: 36 [JM]  0027 Hgb urine dipstick(!): SMALL Currently spotting  [JM]  0027 Ketones, ur(!): 5 Mild dehydratiion, getting fluids [JM]  0027 EKG 12-Lead reassuring [JM]    Clinical Course User Index [JM] Flora Ratz, Mayo, MD     Images ordered viewed and obtained by myself. Agree with Radiology interpretation. Details in ED course.  Labs ordered reviewed by myself as detailed in ED course.  Consultations obtained/considered detailed in ED course.    CRITICAL INTERVENTIONS:  IV Potassium to avoid further depletion with protonix and emesis  Patient without emesis during her whole visit here which encompassed around 6 hours.  She is not happy about not having an answer for her problems however she is open to following up with her GI doctor for further workup as needed.  Is open to trying Reglan  instead of Phenergan . PCP follow up for potassium recheck.   Return here for any new or worsening symptoms.   FINAL IMPRESSION Final diagnoses:  Nausea and vomiting, unspecified vomiting type  Hypokalemia     Disposition A medical screening exam was performed and I feel the patient has had an appropriate workup for their chief complaint at this time and likelihood of emergent condition existing is low. They have been counseled on decision, DISCHARGE, follow up and which symptoms necessitate immediate return to the emergency department. They or their family verbally stated understanding and agreement with plan and discharged in stable condition.   ____________________________________________   NEW OUTPATIENT MEDICATIONS STARTED DURING THIS VISIT:  New Prescriptions   METOCLOPRAMIDE  (REGLAN ) 10 MG TABLET    Take 1 tablet (10 mg total) by mouth every 6 (six) hours as needed for nausea  (nausea/headache).   OXYCODONE -ACETAMINOPHEN  (PERCOCET) 5-325 MG TABLET    Take 1 tablet by mouth every 8 (eight) hours as needed for severe pain (pain score 7-10).   POTASSIUM CHLORIDE SA (KLOR-CON M) 20 MEQ TABLET    Take 2 tablets (40 mEq total) by mouth daily for 7 days.    Note:  This note was prepared with assistance of Dragon voice recognition software. Occasional wrong-word or sound-a-like substitutions may have occurred due to the inherent limitations of voice recognition software.    Minda Faas, Mayo, MD 07/29/24 [phone]

## 2024-09-05 ENCOUNTER — Other Ambulatory Visit: Payer: Self-pay | Admitting: Internal Medicine

## 2024-09-05 DIAGNOSIS — Z1231 Encounter for screening mammogram for malignant neoplasm of breast: Secondary | ICD-10-CM

## 2024-09-09 ENCOUNTER — Ambulatory Visit
Admission: RE | Admit: 2024-09-09 | Discharge: 2024-09-09 | Disposition: A | Discharge status: Home / Self Care | Payer: PRIVATE HEALTH INSURANCE | Source: Ambulatory Visit

## 2024-09-09 DIAGNOSIS — Z1231 Encounter for screening mammogram for malignant neoplasm of breast: Secondary | ICD-10-CM
